# Patient Record
Sex: Female | Born: 1935 | Race: White | Hispanic: No | Marital: Married | Smoking: Former smoker
Health system: Southern US, Community
[De-identification: ages and names within clinical notes are randomized; demographics above are authoritative.]

## PROBLEM LIST (undated history)

## (undated) DIAGNOSIS — K5720 Diverticulitis of large intestine with perforation and abscess without bleeding: Secondary | ICD-10-CM

## (undated) DIAGNOSIS — I10 Essential (primary) hypertension: Secondary | ICD-10-CM

## (undated) DIAGNOSIS — I4821 Permanent atrial fibrillation: Secondary | ICD-10-CM

## (undated) DIAGNOSIS — M8580 Other specified disorders of bone density and structure, unspecified site: Secondary | ICD-10-CM

## (undated) HISTORY — DX: Essential (primary) hypertension: I10

## (undated) HISTORY — PX: REPLACEMENT TOTAL KNEE: SUR1224

## (undated) HISTORY — DX: Diverticulitis of large intestine with perforation and abscess without bleeding: K57.20

## (undated) HISTORY — DX: Other specified disorders of bone density and structure, unspecified site: M85.80

---
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
## 1969-10-18 HISTORY — PX: ABDOMINAL HYSTERECTOMY: SHX81

## 1970-10-18 HISTORY — PX: OVARIAN CYST SURGERY: SHX726

## 1989-10-18 HISTORY — PX: COLON RESECTION: SHX5231

## 1999-03-11 ENCOUNTER — Other Ambulatory Visit: Admission: RE | Admit: 1999-03-11 | Discharge: 1999-03-11 | Payer: Self-pay | Admitting: Gynecology

## 2000-07-06 ENCOUNTER — Other Ambulatory Visit: Admission: RE | Admit: 2000-07-06 | Discharge: 2000-07-06 | Payer: Self-pay | Admitting: Gynecology

## 2001-11-20 ENCOUNTER — Other Ambulatory Visit: Admission: RE | Admit: 2001-11-20 | Discharge: 2001-11-20 | Payer: Self-pay | Admitting: Gynecology

## 2005-12-02 ENCOUNTER — Other Ambulatory Visit: Admission: RE | Admit: 2005-12-02 | Discharge: 2005-12-02 | Payer: Self-pay | Admitting: Gynecology

## 2014-06-11 ENCOUNTER — Ambulatory Visit: Payer: Self-pay | Admitting: Gynecology

## 2014-07-24 ENCOUNTER — Encounter: Payer: Self-pay | Admitting: Gynecology

## 2014-07-24 ENCOUNTER — Ambulatory Visit (INDEPENDENT_AMBULATORY_CARE_PROVIDER_SITE_OTHER): Payer: Medicare Other | Admitting: Gynecology

## 2014-07-24 VITALS — BP 130/80 | Ht 61.0 in | Wt 121.0 lb

## 2014-07-24 DIAGNOSIS — N762 Acute vulvitis: Secondary | ICD-10-CM

## 2014-07-24 DIAGNOSIS — N952 Postmenopausal atrophic vaginitis: Secondary | ICD-10-CM

## 2014-07-24 DIAGNOSIS — N898 Other specified noninflammatory disorders of vagina: Secondary | ICD-10-CM

## 2014-07-24 LAB — WET PREP FOR TRICH, YEAST, CLUE
CLUE CELLS WET PREP: NONE SEEN
TRICH WET PREP: NONE SEEN
YEAST WET PREP: NONE SEEN

## 2014-07-24 MED ORDER — CLOTRIMAZOLE-BETAMETHASONE 1-0.05 % EX CREA: 1.0000 "application " | TOPICAL_CREAM | Freq: Two times a day (BID) | CUTANEOUS | Status: DC

## 2014-07-24 NOTE — Patient Instructions (Signed)
You may obtain a copy of any labs that were done today by logging onto MyChart as outlined in the instructions provided with your AVS (after visit summary). The office will not call with normal lab results but certainly if there are any significant abnormalities then we will contact you.   Health Maintenance, Female A healthy lifestyle and preventative care can promote health and wellness.  Maintain regular health, dental, and eye exams.  Eat a healthy diet. Foods like vegetables, fruits, whole grains, low-fat dairy products, and lean protein foods contain the nutrients you need without too many calories. Decrease your intake of foods high in solid fats, added sugars, and salt. Get information about a proper diet from your caregiver, if necessary.  Regular physical exercise is one of the most important things you can do for your health. Most adults should get at least 150 minutes of moderate-intensity exercise (any activity that increases your heart rate and causes you to sweat) each week. In addition, most adults need muscle-strengthening exercises on 2 or more days a week.   Maintain a healthy weight. The body mass index (BMI) is a screening tool to identify possible weight problems. It provides an estimate of body fat based on height and weight. Your caregiver can help determine your BMI, and can help you achieve or maintain a healthy weight. For adults 20 years and older:  A BMI below 18.5 is considered underweight.  A BMI of 18.5 to 24.9 is normal.  A BMI of 25 to 29.9 is considered overweight.  A BMI of 30 and above is considered obese.  Maintain normal blood lipids and cholesterol by exercising and minimizing your intake of saturated fat. Eat a balanced diet with plenty of fruits and vegetables. Blood tests for lipids and cholesterol should begin at age 61 and be repeated every 5 years. If your lipid or cholesterol levels are high, you are over 50, or you are a high risk for heart  disease, you may need your cholesterol levels checked more frequently.Ongoing high lipid and cholesterol levels should be treated with medicines if diet and exercise are not effective.  If you smoke, find out from your caregiver how to quit. If you do not use tobacco, do not start.  Lung cancer screening is recommended for adults aged 33 80 years who are at high risk for developing lung cancer because of a history of smoking. Yearly low-dose computed tomography (CT) is recommended for people who have at least a 30-pack-year history of smoking and are a current smoker or have quit within the past 15 years. A pack year of smoking is smoking an average of 1 pack of cigarettes a day for 1 year (for example: 1 pack a day for 30 years or 2 packs a day for 15 years). Yearly screening should continue until the smoker has stopped smoking for at least 15 years. Yearly screening should also be stopped for people who develop a health problem that would prevent them from having lung cancer treatment.  If you are pregnant, do not drink alcohol. If you are breastfeeding, be very cautious about drinking alcohol. If you are not pregnant and choose to drink alcohol, do not exceed 1 drink per day. One drink is considered to be 12 ounces (355 mL) of beer, 5 ounces (148 mL) of wine, or 1.5 ounces (44 mL) of liquor.  Avoid use of street drugs. Do not share needles with anyone. Ask for help if you need support or instructions about stopping  the use of drugs.  High blood pressure causes heart disease and increases the risk of stroke. Blood pressure should be checked at least every 1 to 2 years. Ongoing high blood pressure should be treated with medicines, if weight loss and exercise are not effective.  If you are 59 to 78 years old, ask your caregiver if you should take aspirin to prevent strokes.  Diabetes screening involves taking a blood sample to check your fasting blood sugar level. This should be done once every 3  years, after age 91, if you are within normal weight and without risk factors for diabetes. Testing should be considered at a younger age or be carried out more frequently if you are overweight and have at least 1 risk factor for diabetes.  Breast cancer screening is essential preventative care for women. You should practice "breast self-awareness." This means understanding the normal appearance and feel of your breasts and may include breast self-examination. Any changes detected, no matter how small, should be reported to a caregiver. Women in their 66s and 30s should have a clinical breast exam (CBE) by a caregiver as part of a regular health exam every 1 to 3 years. After age 101, women should have a CBE every year. Starting at age 100, women should consider having a mammogram (breast X-ray) every year. Women who have a family history of breast cancer should talk to their caregiver about genetic screening. Women at a high risk of breast cancer should talk to their caregiver about having an MRI and a mammogram every year.  Breast cancer gene (BRCA)-related cancer risk assessment is recommended for women who have family members with BRCA-related cancers. BRCA-related cancers include breast, ovarian, tubal, and peritoneal cancers. Having family members with these cancers may be associated with an increased risk for harmful changes (mutations) in the breast cancer genes BRCA1 and BRCA2. Results of the assessment will determine the need for genetic counseling and BRCA1 and BRCA2 testing.  The Pap test is a screening test for cervical cancer. Women should have a Pap test starting at age 57. Between ages 25 and 35, Pap tests should be repeated every 2 years. Beginning at age 37, you should have a Pap test every 3 years as long as the past 3 Pap tests have been normal. If you had a hysterectomy for a problem that was not cancer or a condition that could lead to cancer, then you no longer need Pap tests. If you are  between ages 50 and 76, and you have had normal Pap tests going back 10 years, you no longer need Pap tests. If you have had past treatment for cervical cancer or a condition that could lead to cancer, you need Pap tests and screening for cancer for at least 20 years after your treatment. If Pap tests have been discontinued, risk factors (such as a new sexual partner) need to be reassessed to determine if screening should be resumed. Some women have medical problems that increase the chance of getting cervical cancer. In these cases, your caregiver may recommend more frequent screening and Pap tests.  The human papillomavirus (HPV) test is an additional test that may be used for cervical cancer screening. The HPV test looks for the virus that can cause the cell changes on the cervix. The cells collected during the Pap test can be tested for HPV. The HPV test could be used to screen women aged 44 years and older, and should be used in women of any age  who have unclear Pap test results. After the age of 55, women should have HPV testing at the same frequency as a Pap test.  Colorectal cancer can be detected and often prevented. Most routine colorectal cancer screening begins at the age of 44 and continues through age 20. However, your caregiver may recommend screening at an earlier age if you have risk factors for colon cancer. On a yearly basis, your caregiver may provide home test kits to check for hidden blood in the stool. Use of a small camera at the end of a tube, to directly examine the colon (sigmoidoscopy or colonoscopy), can detect the earliest forms of colorectal cancer. Talk to your caregiver about this at age 86, when routine screening begins. Direct examination of the colon should be repeated every 5 to 10 years through age 13, unless early forms of pre-cancerous polyps or small growths are found.  Hepatitis C blood testing is recommended for all people born from [birth year] through 1963 and any  individual with known risks for hepatitis C.  Practice safe sex. Use condoms and avoid high-risk sexual practices to reduce the spread of sexually transmitted infections (STIs). Sexually active women aged 36 and younger should be checked for Chlamydia, which is a common sexually transmitted infection. Older women with new or multiple partners should also be tested for Chlamydia. Testing for other STIs is recommended if you are sexually active and at increased risk.  Osteoporosis is a disease in which the bones lose minerals and strength with aging. This can result in serious bone fractures. The risk of osteoporosis can be identified using a bone density scan. Women ages 20 and over and women at risk for fractures or osteoporosis should discuss screening with their caregivers. Ask your caregiver whether you should be taking a calcium supplement or vitamin D to reduce the rate of osteoporosis.  Menopause can be associated with physical symptoms and risks. Hormone replacement therapy is available to decrease symptoms and risks. You should talk to your caregiver about whether hormone replacement therapy is right for you.  Use sunscreen. Apply sunscreen liberally and repeatedly throughout the day. You should seek shade when your shadow is shorter than you. Protect yourself by wearing long sleeves, pants, a wide-brimmed hat, and sunglasses year round, whenever you are outdoors.  Notify your caregiver of new moles or changes in moles, especially if there is a change in shape or color. Also notify your caregiver if a mole is larger than the size of a pencil eraser.  Stay current with your immunizations. Document Released: 04/19/2011 Document Revised: 01/29/2013 Document Reviewed: 04/19/2011 [facility] Patient Information 2014 [employer].

## 2014-07-24 NOTE — Progress Notes (Signed)
Carrie Morales 02/10/1928 [id number]        80 y.o.  G2P0011 new patient, former patient of Dr. Nicholas LoseLomax complaining of one-week history of vulvar irritation. History of same complaint several years ago when she was treated with betamethasone/clotrimazole cream with good relief.    Past medical history,surgical history, problem list, medications, allergies, family history and social history were all reviewed and documented as reviewed in the EPIC chart.  ROS:  12 system ROS performed with pertinent positives and negatives included in the history, assessment and plan.   Additional significant findings :  none   Exam: [facility] personassistant Filed Vitals:   07/24/14 1409  BP: 130/80  Height: 5\' 1"  (1.549 m)  Weight: 121 lb (54.885 kg)   General appearance:  Normal affect, orientation and appearance. Skin: Grossly normal HEENT: Without gross lesions.  No cervical or supraclavicular adenopathy. Thyroid normal.  Lungs:  Clear without wheezing, rales or rhonchi Cardiac: RR, without RMG Abdominal:  Soft, nontender, without masses, guarding, rebound, organomegaly or hernia Breasts:  Examined lying and sitting without masses, retractions, discharge or axillary adenopathy. Pelvic:  Ext/BUS/vagina with significant atrophic changes  Adnexa  Without masses or tenderness    Anus and perineum  Normal   Rectovaginal  Normal sphincter tone without palpated masses or tenderness.    Assessment/Plan:  80 y.o. G2P0011 new patient.   1. Postmenopausal/atrophic genital changes. Patient status post TAH and subsequent LSO for ectopic pregnancy. Right ovarian cystectomy. Without significant hot flushes, night sweats or vaginal dryness. Is not sexual active. Continue to monitor. 2. Vulvitis. One week of vulvar itching. No discharge odor or urinary complaints.  Exam overall was unremarkable. Wet prep is negative. We'll cover for external irritation with Lotrisone cream twice a day as needed as this seems to have  helped in the past. Follow up if symptoms persist, worsen or recur. 3. Pap smear 2011. No Pap smear done today. No history of significant abnormal Pap smears. Reviewed current screening guidelines. We both agreed to stop screening issues over the is a 65 and status post hysterectomy. 4. Mammography 2013. Recommend follow up mammogram now and patient agrees to schedule. SBE monthly reviewed. 5. DEXA reported several years ago. Do not have a copy of this. Patient is going to check with her primary physician to see when her last bone density was and schedule if needed. Increase calcium vitamin D reviewed. 6. Colonoscopy several years ago historically. Will repeat at her primary physician's recommendation. 7. Health maintenance. No blood work done as this is done through her primary physician's office. Follow up one year, sooner as needed.     Dara LordsFONTAINE,Erin Obando P MD, 2:52 PM 07/24/2014     4

## 2014-07-24 NOTE — Addendum Note (Signed)
Addended by: Dayna BarkerGARDNER, KIMBERLY K on: 07/24/2014 03:40 PM   Modules accepted: Orders

## 2014-07-25 LAB — URINALYSIS W MICROSCOPIC + REFLEX CULTURE
Bacteria, UA: NONE SEEN
Bilirubin Urine: NEGATIVE
Casts: NONE SEEN
Crystals: NONE SEEN
Glucose, UA: NEGATIVE mg/dL
Hgb urine dipstick: NEGATIVE
Ketones, ur: NEGATIVE mg/dL
Nitrite: NEGATIVE
PROTEIN: NEGATIVE mg/dL
Specific Gravity, Urine: 1.009 (ref 1.005–1.030)
Squamous Epithelial / LPF: NONE SEEN
UROBILINOGEN UA: 0.2 mg/dL (ref 0.0–1.0)
pH: 6 (ref 5.0–8.0)

## 2014-07-25 LAB — URINE CULTURE
COLONY COUNT: NO GROWTH
ORGANISM ID, BACTERIA: NO GROWTH

## 2014-08-19 ENCOUNTER — Encounter: Payer: Self-pay | Admitting: Gynecology

## 2014-09-25 ENCOUNTER — Encounter: Payer: Self-pay | Admitting: Gynecology

## 2014-10-04 ENCOUNTER — Encounter: Payer: Self-pay | Admitting: Gynecology

## 2014-12-31 ENCOUNTER — Encounter: Payer: Self-pay | Admitting: Gynecology

## 2014-12-31 ENCOUNTER — Ambulatory Visit (INDEPENDENT_AMBULATORY_CARE_PROVIDER_SITE_OTHER): Payer: Medicare Other | Admitting: Gynecology

## 2014-12-31 VITALS — BP 130/84

## 2014-12-31 DIAGNOSIS — N898 Other specified noninflammatory disorders of vagina: Secondary | ICD-10-CM

## 2014-12-31 LAB — WET PREP FOR TRICH, YEAST, CLUE
Clue Cells Wet Prep HPF POC: NONE SEEN
TRICH WET PREP: NONE SEEN
YEAST WET PREP: NONE SEEN

## 2014-12-31 MED ORDER — FLUCONAZOLE 150 MG PO TABS: 150.0000 mg | ORAL_TABLET | Freq: Once | ORAL | Status: DC

## 2014-12-31 NOTE — Patient Instructions (Signed)
Take the one Diflucan pill. Call me if the vaginal discharge continues.

## 2014-12-31 NOTE — Progress Notes (Signed)
Carrie Morales 03/13/1928 [id number]        81 y.o.  G2P0011 Presents with several weeks of vaginal discharge. Initially had full for irritation with this but used Lotrisone cream externally and this has resolved. Her discharge has continued. No urinary symptoms such as frequency dysuria or urgency. No vaginal odor.  Past medical history,surgical history, problem list, medications, allergies, family history and social history were all reviewed and documented in the EPIC chart.  Directed ROS with pertinent positives and negatives documented in the history of present illness/assessment and plan.  Exam: Carrie Morales assistant Filed Vitals:   12/31/14 1514  BP: 130/84   General appearance:  Normal Abdomen soft nontender without masses guarding rebound Pelvic external BUS vagina normal with atrophic changes.  Slight white discharge noted. Bimanual without masses or tenderness  Assessment/Plan:  81 y.o. G2P0011 with history suspicious for low-grade yeast. Her wet prep is negative. Will cover with Diflucan 150 mg 1 dose. If her discharge continues and patient will call me and I will cover a low-grade bacterial with Cleocin vaginal cream but at this point we'll hold to see if she doesn't clear with a single Diflucan.     Dara LordsFONTAINE,Earlee Herald P MD, 4:14 PM 12/31/2014

## 2015-01-06 ENCOUNTER — Telehealth: Payer: Self-pay | Admitting: *Deleted

## 2015-01-06 MED ORDER — FLUCONAZOLE 150 MG PO TABS: 150.0000 mg | ORAL_TABLET | Freq: Once | ORAL | Status: DC

## 2015-01-06 NOTE — Telephone Encounter (Signed)
Okay to refill Diflucan 1

## 2015-01-06 NOTE — Telephone Encounter (Signed)
Pt was seen and treated for yeast infection on 12/31/14 with Diflucan 150 mg x 1 dose. Pt said she feel much better, asked if 1 more pill could be sent to pharmacy to make it 100% better. Still has some irritation. Please advise

## 2015-01-06 NOTE — Telephone Encounter (Signed)
Pt informed, Rx sent. 

## 2019-03-25 ENCOUNTER — Inpatient Hospital Stay (HOSPITAL_COMMUNITY): Payer: Medicare Other

## 2019-03-25 ENCOUNTER — Other Ambulatory Visit: Payer: Self-pay

## 2019-03-25 ENCOUNTER — Inpatient Hospital Stay (HOSPITAL_COMMUNITY)
Admission: EM | Admit: 2019-03-25 | Discharge: 2019-04-18 | DRG: 870 | Disposition: E | Payer: Medicare Other | Source: Ambulatory Visit | Attending: Internal Medicine | Admitting: Internal Medicine

## 2019-03-25 ENCOUNTER — Emergency Department (HOSPITAL_COMMUNITY): Payer: Medicare Other

## 2019-03-25 ENCOUNTER — Encounter (HOSPITAL_COMMUNITY): Payer: Self-pay | Admitting: *Deleted

## 2019-03-25 DIAGNOSIS — Z7901 Long term (current) use of anticoagulants: Secondary | ICD-10-CM

## 2019-03-25 DIAGNOSIS — J690 Pneumonitis due to inhalation of food and vomit: Secondary | ICD-10-CM | POA: Diagnosis present

## 2019-03-25 DIAGNOSIS — Z452 Encounter for adjustment and management of vascular access device: Secondary | ICD-10-CM

## 2019-03-25 DIAGNOSIS — N183 Chronic kidney disease, stage 3 (moderate): Secondary | ICD-10-CM | POA: Diagnosis present

## 2019-03-25 DIAGNOSIS — R7989 Other specified abnormal findings of blood chemistry: Secondary | ICD-10-CM | POA: Diagnosis not present

## 2019-03-25 DIAGNOSIS — I313 Pericardial effusion (noninflammatory): Secondary | ICD-10-CM | POA: Diagnosis present

## 2019-03-25 DIAGNOSIS — E46 Unspecified protein-calorie malnutrition: Secondary | ICD-10-CM | POA: Diagnosis present

## 2019-03-25 DIAGNOSIS — Z87891 Personal history of nicotine dependence: Secondary | ICD-10-CM

## 2019-03-25 DIAGNOSIS — J80 Acute respiratory distress syndrome: Secondary | ICD-10-CM | POA: Diagnosis present

## 2019-03-25 DIAGNOSIS — Z882 Allergy status to sulfonamides status: Secondary | ICD-10-CM

## 2019-03-25 DIAGNOSIS — J181 Lobar pneumonia, unspecified organism: Secondary | ICD-10-CM

## 2019-03-25 DIAGNOSIS — Y92239 Unspecified place in hospital as the place of occurrence of the external cause: Secondary | ICD-10-CM | POA: Diagnosis not present

## 2019-03-25 DIAGNOSIS — M8580 Other specified disorders of bone density and structure, unspecified site: Secondary | ICD-10-CM | POA: Diagnosis present

## 2019-03-25 DIAGNOSIS — I5032 Chronic diastolic (congestive) heart failure: Secondary | ICD-10-CM | POA: Diagnosis not present

## 2019-03-25 DIAGNOSIS — I130 Hypertensive heart and chronic kidney disease with heart failure and stage 1 through stage 4 chronic kidney disease, or unspecified chronic kidney disease: Secondary | ICD-10-CM | POA: Diagnosis present

## 2019-03-25 DIAGNOSIS — Z9911 Dependence on respirator [ventilator] status: Secondary | ICD-10-CM

## 2019-03-25 DIAGNOSIS — J9601 Acute respiratory failure with hypoxia: Secondary | ICD-10-CM

## 2019-03-25 DIAGNOSIS — Z880 Allergy status to penicillin: Secondary | ICD-10-CM | POA: Diagnosis not present

## 2019-03-25 DIAGNOSIS — A419 Sepsis, unspecified organism: Secondary | ICD-10-CM | POA: Diagnosis present

## 2019-03-25 DIAGNOSIS — Z809 Family history of malignant neoplasm, unspecified: Secondary | ICD-10-CM

## 2019-03-25 DIAGNOSIS — Z978 Presence of other specified devices: Secondary | ICD-10-CM | POA: Diagnosis not present

## 2019-03-25 DIAGNOSIS — I4821 Permanent atrial fibrillation: Secondary | ICD-10-CM | POA: Diagnosis present

## 2019-03-25 DIAGNOSIS — K5651 Intestinal adhesions [bands], with partial obstruction: Secondary | ICD-10-CM | POA: Diagnosis not present

## 2019-03-25 DIAGNOSIS — Z7982 Long term (current) use of aspirin: Secondary | ICD-10-CM

## 2019-03-25 DIAGNOSIS — K56600 Partial intestinal obstruction, unspecified as to cause: Secondary | ICD-10-CM | POA: Diagnosis present

## 2019-03-25 DIAGNOSIS — J189 Pneumonia, unspecified organism: Secondary | ICD-10-CM | POA: Diagnosis present

## 2019-03-25 DIAGNOSIS — N179 Acute kidney failure, unspecified: Secondary | ICD-10-CM | POA: Diagnosis not present

## 2019-03-25 DIAGNOSIS — T380X5A Adverse effect of glucocorticoids and synthetic analogues, initial encounter: Secondary | ICD-10-CM | POA: Diagnosis not present

## 2019-03-25 DIAGNOSIS — R197 Diarrhea, unspecified: Secondary | ICD-10-CM | POA: Diagnosis not present

## 2019-03-25 DIAGNOSIS — Z0189 Encounter for other specified special examinations: Secondary | ICD-10-CM

## 2019-03-25 DIAGNOSIS — I5033 Acute on chronic diastolic (congestive) heart failure: Secondary | ICD-10-CM | POA: Diagnosis present

## 2019-03-25 DIAGNOSIS — I4891 Unspecified atrial fibrillation: Secondary | ICD-10-CM

## 2019-03-25 DIAGNOSIS — Z20828 Contact with and (suspected) exposure to other viral communicable diseases: Secondary | ICD-10-CM | POA: Diagnosis present

## 2019-03-25 DIAGNOSIS — Z7189 Other specified counseling: Secondary | ICD-10-CM | POA: Diagnosis not present

## 2019-03-25 DIAGNOSIS — I5031 Acute diastolic (congestive) heart failure: Secondary | ICD-10-CM | POA: Diagnosis not present

## 2019-03-25 DIAGNOSIS — I361 Nonrheumatic tricuspid (valve) insufficiency: Secondary | ICD-10-CM | POA: Diagnosis not present

## 2019-03-25 DIAGNOSIS — I482 Chronic atrial fibrillation, unspecified: Secondary | ICD-10-CM | POA: Diagnosis present

## 2019-03-25 DIAGNOSIS — Z885 Allergy status to narcotic agent status: Secondary | ICD-10-CM

## 2019-03-25 DIAGNOSIS — J9690 Respiratory failure, unspecified, unspecified whether with hypoxia or hypercapnia: Secondary | ICD-10-CM

## 2019-03-25 DIAGNOSIS — I700 Atherosclerosis of aorta: Secondary | ICD-10-CM | POA: Diagnosis present

## 2019-03-25 DIAGNOSIS — K449 Diaphragmatic hernia without obstruction or gangrene: Secondary | ICD-10-CM | POA: Diagnosis present

## 2019-03-25 DIAGNOSIS — K56609 Unspecified intestinal obstruction, unspecified as to partial versus complete obstruction: Secondary | ICD-10-CM

## 2019-03-25 DIAGNOSIS — Z79899 Other long term (current) drug therapy: Secondary | ICD-10-CM

## 2019-03-25 DIAGNOSIS — R739 Hyperglycemia, unspecified: Secondary | ICD-10-CM | POA: Diagnosis present

## 2019-03-25 DIAGNOSIS — Z66 Do not resuscitate: Secondary | ICD-10-CM | POA: Diagnosis not present

## 2019-03-25 DIAGNOSIS — Z515 Encounter for palliative care: Secondary | ICD-10-CM | POA: Diagnosis not present

## 2019-03-25 DIAGNOSIS — I2510 Atherosclerotic heart disease of native coronary artery without angina pectoris: Secondary | ICD-10-CM | POA: Diagnosis not present

## 2019-03-25 DIAGNOSIS — I080 Rheumatic disorders of both mitral and aortic valves: Secondary | ICD-10-CM | POA: Diagnosis present

## 2019-03-25 DIAGNOSIS — I10 Essential (primary) hypertension: Secondary | ICD-10-CM | POA: Diagnosis not present

## 2019-03-25 DIAGNOSIS — E870 Hyperosmolality and hypernatremia: Secondary | ICD-10-CM | POA: Diagnosis not present

## 2019-03-25 DIAGNOSIS — I2583 Coronary atherosclerosis due to lipid rich plaque: Secondary | ICD-10-CM | POA: Diagnosis present

## 2019-03-25 DIAGNOSIS — I4892 Unspecified atrial flutter: Secondary | ICD-10-CM | POA: Diagnosis present

## 2019-03-25 DIAGNOSIS — Z6820 Body mass index (BMI) 20.0-20.9, adult: Secondary | ICD-10-CM

## 2019-03-25 DIAGNOSIS — J9621 Acute and chronic respiratory failure with hypoxia: Secondary | ICD-10-CM | POA: Diagnosis not present

## 2019-03-25 DIAGNOSIS — Z90710 Acquired absence of both cervix and uterus: Secondary | ICD-10-CM | POA: Diagnosis not present

## 2019-03-25 DIAGNOSIS — I959 Hypotension, unspecified: Secondary | ICD-10-CM

## 2019-03-25 DIAGNOSIS — R0902 Hypoxemia: Secondary | ICD-10-CM

## 2019-03-25 DIAGNOSIS — E860 Dehydration: Secondary | ICD-10-CM | POA: Diagnosis present

## 2019-03-25 DIAGNOSIS — R0602 Shortness of breath: Secondary | ICD-10-CM

## 2019-03-25 DIAGNOSIS — I340 Nonrheumatic mitral (valve) insufficiency: Secondary | ICD-10-CM | POA: Diagnosis not present

## 2019-03-25 DIAGNOSIS — R778 Other specified abnormalities of plasma proteins: Secondary | ICD-10-CM

## 2019-03-25 HISTORY — DX: Permanent atrial fibrillation: I48.21

## 2019-03-25 HISTORY — DX: Essential (primary) hypertension: I10

## 2019-03-25 LAB — BLOOD GAS, ARTERIAL
Acid-base deficit: 1.1 mmol/L (ref 0.0–2.0)
Bicarbonate: 23.7 mmol/L (ref 20.0–28.0)
FIO2: 100
O2 Saturation: 81.8 %
Patient temperature: 37
pCO2 arterial: 32.4 mmHg (ref 32.0–48.0)
pH, Arterial: 7.452 — ABNORMAL HIGH (ref 7.350–7.450)
pO2, Arterial: 47.2 mmHg — ABNORMAL LOW (ref 83.0–108.0)

## 2019-03-25 LAB — CBC WITH DIFFERENTIAL/PLATELET
Abs Immature Granulocytes: 0.05 10*3/uL (ref 0.00–0.07)
Basophils Absolute: 0 10*3/uL (ref 0.0–0.1)
Basophils Relative: 0 %
Eosinophils Absolute: 0 10*3/uL (ref 0.0–0.5)
Eosinophils Relative: 0 %
HCT: 53.1 % — ABNORMAL HIGH (ref 36.0–46.0)
Hemoglobin: 16.6 g/dL — ABNORMAL HIGH (ref 12.0–15.0)
Immature Granulocytes: 0 %
Lymphocytes Relative: 6 %
Lymphs Abs: 0.8 10*3/uL (ref 0.7–4.0)
MCH: 30.1 pg (ref 26.0–34.0)
MCHC: 31.3 g/dL (ref 30.0–36.0)
MCV: 96.2 fL (ref 80.0–100.0)
Monocytes Absolute: 0.5 10*3/uL (ref 0.1–1.0)
Monocytes Relative: 4 %
Neutro Abs: 11.2 10*3/uL — ABNORMAL HIGH (ref 1.7–7.7)
Neutrophils Relative %: 90 %
Platelets: 304 10*3/uL (ref 150–400)
RBC: 5.52 MIL/uL — ABNORMAL HIGH (ref 3.87–5.11)
RDW: 13.4 % (ref 11.5–15.5)
WBC: 12.6 10*3/uL — ABNORMAL HIGH (ref 4.0–10.5)
nRBC: 0 % (ref 0.0–0.2)

## 2019-03-25 LAB — COMPREHENSIVE METABOLIC PANEL
ALT: 25 U/L (ref 0–44)
AST: 25 U/L (ref 15–41)
Albumin: 4 g/dL (ref 3.5–5.0)
Alkaline Phosphatase: 67 U/L (ref 38–126)
Anion gap: 16 — ABNORMAL HIGH (ref 5–15)
BUN: 20 mg/dL (ref 8–23)
CO2: 27 mmol/L (ref 22–32)
Calcium: 10.3 mg/dL (ref 8.9–10.3)
Chloride: 95 mmol/L — ABNORMAL LOW (ref 98–111)
Creatinine, Ser: 0.84 mg/dL (ref 0.44–1.00)
GFR calc Af Amer: >60 mL/min (ref >60)
GFR calc non Af Amer: >60 mL/min (ref >60)
Glucose, Bld: 163 mg/dL — ABNORMAL HIGH (ref 70–99)
Potassium: 4.5 mmol/L (ref 3.5–5.1)
Sodium: 138 mmol/L (ref 135–145)
Total Bilirubin: 1.5 mg/dL — ABNORMAL HIGH (ref 0.3–1.2)
Total Protein: 7.5 g/dL (ref 6.5–8.1)

## 2019-03-25 LAB — URINALYSIS, ROUTINE W REFLEX MICROSCOPIC
Bacteria, UA: NONE SEEN
Bilirubin Urine: NEGATIVE
Glucose, UA: NEGATIVE mg/dL
Hgb urine dipstick: NEGATIVE
Ketones, ur: 20 mg/dL — AB
Leukocytes,Ua: NEGATIVE
Nitrite: NEGATIVE
Protein, ur: 100 mg/dL — AB
Specific Gravity, Urine: 1.027 (ref 1.005–1.030)
pH: 5 (ref 5.0–8.0)

## 2019-03-25 LAB — GLUCOSE, CAPILLARY: Glucose-Capillary: 144 mg/dL — ABNORMAL HIGH (ref 70–99)

## 2019-03-25 LAB — LIPASE, BLOOD: Lipase: 46 U/L (ref 11–51)

## 2019-03-25 LAB — TROPONIN I: Troponin I: 0.03 ng/mL (ref <0.03)

## 2019-03-25 LAB — SARS CORONAVIRUS 2 BY RT PCR (HOSPITAL ORDER, PERFORMED IN ~~LOC~~ HOSPITAL LAB): SARS Coronavirus 2: NEGATIVE

## 2019-03-25 LAB — LACTIC ACID, PLASMA: Lactic Acid, Venous: 4.4 mmol/L (ref 0.5–1.9)

## 2019-03-25 MED ORDER — DILTIAZEM HCL 25 MG/5ML IV SOLN
10.0000 mg | Freq: Once | INTRAVENOUS | Status: AC
  Filled 2019-03-25: qty 5

## 2019-03-25 MED ORDER — PANTOPRAZOLE SODIUM 40 MG IV SOLR
40.0000 mg | Freq: Once | INTRAVENOUS | Status: AC
  Administered 2019-03-25: 40 mg via INTRAVENOUS
  Filled 2019-03-25: qty 40

## 2019-03-25 MED ORDER — SODIUM CHLORIDE 0.9% FLUSH
3.0000 mL | INTRAVENOUS | Status: DC | PRN
  Administered 2019-03-25: 3 mL via INTRAVENOUS
  Filled 2019-03-25: qty 3

## 2019-03-25 MED ORDER — CHLORHEXIDINE GLUCONATE CLOTH 2 % EX PADS
6.0000 | MEDICATED_PAD | Freq: Every day | CUTANEOUS | Status: DC
  Administered 2019-03-25 – 2019-03-26 (×2): 6 via TOPICAL

## 2019-03-25 MED ORDER — LABETALOL HCL 5 MG/ML IV SOLN: 10.0000 mg | INTRAVENOUS | Status: DC | PRN

## 2019-03-25 MED ORDER — ACETAMINOPHEN 650 MG RE SUPP
650.0000 mg | Freq: Four times a day (QID) | RECTAL | Status: DC | PRN
  Administered 2019-03-25 – 2019-03-26 (×2): 650 mg via RECTAL
  Filled 2019-03-25 (×2): qty 1

## 2019-03-25 MED ORDER — SODIUM CHLORIDE 0.9 % IV BOLUS
500.0000 mL | Freq: Once | INTRAVENOUS | Status: AC
  Administered 2019-03-25: 500 mL via INTRAVENOUS

## 2019-03-25 MED ORDER — KCL IN DEXTROSE-NACL 20-5-0.45 MEQ/L-%-% IV SOLN
INTRAVENOUS | Status: DC
  Administered 2019-03-25 – 2019-03-26 (×2): via INTRAVENOUS

## 2019-03-25 MED ORDER — KCL IN DEXTROSE-NACL 10-5-0.45 MEQ/L-%-% IV SOLN
INTRAVENOUS | Status: DC
  Filled 2019-03-25: qty 1000

## 2019-03-25 MED ORDER — DIGOXIN 0.25 MG/ML IJ SOLN
0.1250 mg | Freq: Every day | INTRAMUSCULAR | Status: DC
  Administered 2019-03-25: 17:00:00 0.125 mg via INTRAVENOUS
  Filled 2019-03-25: qty 2

## 2019-03-25 MED ORDER — METOPROLOL TARTRATE 5 MG/5ML IV SOLN
5.0000 mg | Freq: Four times a day (QID) | INTRAVENOUS | Status: DC
  Filled 2019-03-25: qty 5

## 2019-03-25 MED ORDER — IOHEXOL 300 MG/ML  SOLN
100.0000 mL | Freq: Once | INTRAMUSCULAR | Status: AC | PRN
  Administered 2019-03-25: 13:00:00 100 mL via INTRAVENOUS

## 2019-03-25 MED ORDER — POLYETHYLENE GLYCOL 3350 17 G PO PACK: 17.0000 g | PACK | Freq: Every day | ORAL | Status: DC | PRN

## 2019-03-25 MED ORDER — SODIUM CHLORIDE 0.9 % IV SOLN
2.0000 g | INTRAVENOUS | Status: DC
  Administered 2019-03-25: 23:00:00 2 g via INTRAVENOUS
  Filled 2019-03-25: qty 20

## 2019-03-25 MED ORDER — METOPROLOL TARTRATE 5 MG/5ML IV SOLN
INTRAVENOUS | Status: AC
  Administered 2019-03-25: 5 mg
  Filled 2019-03-25: qty 5

## 2019-03-25 MED ORDER — LIDOCAINE HCL URETHRAL/MUCOSAL 2 % EX GEL
1.0000 "application " | Freq: Once | CUTANEOUS | Status: AC
  Administered 2019-03-25: 1
  Filled 2019-03-25: qty 10

## 2019-03-25 MED ORDER — PANTOPRAZOLE SODIUM 40 MG IV SOLR
40.0000 mg | INTRAVENOUS | Status: DC
  Administered 2019-03-26 – 2019-04-10 (×16): 40 mg via INTRAVENOUS
  Filled 2019-03-25 (×17): qty 40

## 2019-03-25 MED ORDER — TRAZODONE HCL 50 MG PO TABS
50.0000 mg | ORAL_TABLET | Freq: Every evening | ORAL | Status: DC | PRN
  Administered 2019-03-26 – 2019-04-08 (×7): 50 mg via ORAL
  Filled 2019-03-25 (×7): qty 1

## 2019-03-25 MED ORDER — METRONIDAZOLE IN NACL 5-0.79 MG/ML-% IV SOLN
500.0000 mg | Freq: Three times a day (TID) | INTRAVENOUS | Status: DC
  Administered 2019-03-26 (×2): 500 mg via INTRAVENOUS
  Filled 2019-03-25 (×2): qty 100

## 2019-03-25 MED ORDER — HALOPERIDOL LACTATE 5 MG/ML IJ SOLN
2.0000 mg | Freq: Once | INTRAMUSCULAR | Status: AC
  Administered 2019-03-25: 2 mg via INTRAVENOUS
  Filled 2019-03-25: qty 1

## 2019-03-25 MED ORDER — ONDANSETRON HCL 4 MG/2ML IJ SOLN: 4.0000 mg | Freq: Four times a day (QID) | INTRAMUSCULAR | Status: DC | PRN

## 2019-03-25 MED ORDER — DILTIAZEM HCL 100 MG IV SOLR
5.0000 mg/h | INTRAVENOUS | Status: DC
  Administered 2019-03-26: 07:00:00 5 mg/h via INTRAVENOUS
  Administered 2019-03-26 – 2019-03-27 (×4): 10 mg/h via INTRAVENOUS
  Administered 2019-03-28 – 2019-03-30 (×8): 12.5 mg/h via INTRAVENOUS
  Filled 2019-03-25 (×14): qty 100

## 2019-03-25 MED ORDER — ONDANSETRON HCL 4 MG PO TABS: 4.0000 mg | ORAL_TABLET | Freq: Four times a day (QID) | ORAL | Status: DC | PRN

## 2019-03-25 MED ORDER — PROMETHAZINE HCL 25 MG/ML IJ SOLN
12.5000 mg | Freq: Once | INTRAMUSCULAR | Status: AC
  Administered 2019-03-25: 12.5 mg via INTRAVENOUS
  Filled 2019-03-25: qty 1

## 2019-03-25 MED ORDER — ACETAMINOPHEN 325 MG PO TABS
650.0000 mg | ORAL_TABLET | Freq: Four times a day (QID) | ORAL | Status: DC | PRN
  Administered 2019-03-30 – 2019-04-08 (×3): 650 mg via ORAL
  Filled 2019-03-25 (×3): qty 2

## 2019-03-25 MED ORDER — DILTIAZEM HCL 25 MG/5ML IV SOLN
INTRAVENOUS | Status: AC
  Administered 2019-03-25: 10 mg
  Filled 2019-03-25: qty 5

## 2019-03-25 MED ORDER — SODIUM CHLORIDE 0.9% FLUSH
3.0000 mL | Freq: Two times a day (BID) | INTRAVENOUS | Status: DC
  Administered 2019-03-25 – 2019-03-30 (×8): 3 mL via INTRAVENOUS
  Administered 2019-03-31: 10 mL via INTRAVENOUS
  Administered 2019-03-31 – 2019-04-09 (×10): 3 mL via INTRAVENOUS

## 2019-03-25 MED ORDER — ALBUTEROL SULFATE (2.5 MG/3ML) 0.083% IN NEBU
2.5000 mg | INHALATION_SOLUTION | RESPIRATORY_TRACT | Status: DC | PRN
  Administered 2019-03-30: 02:00:00 2.5 mg via RESPIRATORY_TRACT
  Filled 2019-03-25: qty 3

## 2019-03-25 MED ORDER — SODIUM CHLORIDE 0.9 % IV SOLN
250.0000 mL | INTRAVENOUS | Status: DC | PRN
  Administered 2019-03-26 – 2019-04-05 (×2): 250 mL via INTRAVENOUS

## 2019-03-25 MED ORDER — ENOXAPARIN SODIUM 60 MG/0.6ML ~~LOC~~ SOLN
50.0000 mg | Freq: Two times a day (BID) | SUBCUTANEOUS | Status: DC
  Administered 2019-03-25: 50 mg via SUBCUTANEOUS
  Filled 2019-03-25 (×2): qty 0.6

## 2019-03-25 MED ORDER — LORAZEPAM 2 MG/ML IJ SOLN
1.0000 mg | Freq: Once | INTRAMUSCULAR | Status: AC
  Administered 2019-03-25: 15:00:00 1 mg via INTRAVENOUS
  Filled 2019-03-25: qty 1

## 2019-03-25 MED ORDER — DILTIAZEM HCL 100 MG IV SOLR
INTRAVENOUS | Status: AC
  Administered 2019-03-25: 5 mg/h
  Filled 2019-03-25: qty 100

## 2019-03-25 NOTE — ED Triage Notes (Signed)
Constipation since Thursday. MD office sent her over to hospital by EMS to evaluate pt. Pt have been vomiting. Denies SOB

## 2019-03-25 NOTE — Progress Notes (Signed)
CRITICAL VALUE ALERT  Critical Value:  Lactic Acid 4.4 Date & Time Notied:  04/12/2019 2355  Provider Notified: Kennon Holter  Orders Received/Actions taken: NO orders at this time

## 2019-03-25 NOTE — Progress Notes (Signed)
Placed patient on BiPAP at beginning of shift for low arterial blood gas PO2 47.2 on 100  NRB mask , respirations 40-50 hr 150's  Came in for bowel obstruction. Patient since this time has slowed respirations down to 30's saturation 100 on 60% BiPAP 14/6  Hr decreased to 108 on Cardizem drip. Lungs sounds appear clear. Patient is getting out dk brown out of ng tube. Not sure if she aspirated on placement of NG tube in er or before as she has pleural effusions in lung base on CT. She appears to be improving as of note.

## 2019-03-25 NOTE — ED Notes (Signed)
Nurse updated pt's son.

## 2019-03-25 NOTE — ED Notes (Signed)
Nurse updated pt's son, Tressie Ellis, on condition of pt and pt's new room number.

## 2019-03-25 NOTE — ED Provider Notes (Signed)
[facility] EMERGENCY DEPARTMENT Provider Note   CSN: [id number] Arrival date & time: 03/19/2019  [id number]    History   Chief Complaint Chief Complaint  Patient presents with  . Abdominal Pain    HPI Shelda AltesKatherine Wallenstein is a 85 y.o. female presenting for evaluation of N/V and constipation.   Patient states last week she developed some abdominal pain, nausea, vomiting, constipation.  Symptoms improved for 2 days with milk of magnesia.  4 days ago, however, she started to develop constipation.  She has not had a bowel movement since 4 days ago.  She normally has a daily bowel movement.  Patient states yesterday she started developed nausea, has been vomiting since.  She has been unable to tolerate any p.o. since.  She reports abdominal pain has not returned.  She denies fevers, chills, chest pain, shortness of breath, cough, urinary symptoms.  She denies hematemesis.  She has had for midline incisions, including for ectopic pregnancy, hysterectomy, and abscessed diverticula s/p colon resection. She states she has had her GB and appendix removed. She has a recent history of A. fib, started 6 months ago.  She has been on Eliquis since.  Patient reports her PCP has been attempting rate control with different medications, but she has not been on a stable medications.  She has not been able to take her blood pressure, A. fib, or Eliquis medications today due to nausea and vomiting.  She has not had anything to eat or drink today.     HPI  Past Medical History:  Diagnosis Date  . A-fib (HCC)   . Colonic diverticular abscess   . Hypertension   . Osteopenia 09/2014 tab T score -1.1    Patient Active Problem List   Diagnosis Date Noted  . SBO (small bowel obstruction) (HCC) May 30, 2019    Past Surgical History:  Procedure Laterality Date  . ABDOMINAL HYSTERECTOMY  1971  . APPENDECTOMY  1958  . COLON RESECTION  1991  . ECTOPIC PREGNANCY SURGERY  1958   LSO  . OVARIAN CYST SURGERY  1972   right  . REPLACEMENT TOTAL KNEE     X 2     OB History    Gravida  2   Para  1   Term      Preterm      AB  1   Living  1     SAB      TAB      Ectopic  1   Multiple      Live Births               Home Medications    Prior to Admission medications   Medication Sig Start Date End Date Taking? Authorizing Provider  apixaban (ELIQUIS) 2.5 MG TABS tablet Take 2.5 mg by mouth 2 (two) times daily.    Yes [provider]  digoxin (LANOXIN) 0.125 MG tablet Take 0.125 mg by mouth daily.   Yes [provider]  furosemide (LASIX) 20 MG tablet Take 20 mg by mouth See admin instructions. Take one tablet by mouth 5 days per week   Yes [provider]  losartan (COZAAR) 50 MG tablet Take 50 mg by mouth 2 (two) times a day.   Yes [provider]  metoprolol tartrate (LOPRESSOR) 100 MG tablet Take 100 mg by mouth 2 (two) times daily.   Yes [provider]  ondansetron (ZOFRAN) 4 MG tablet Take 4 mg by mouth daily as needed for  nausea or vomiting.   Yes [provider]  potassium chloride (K-DUR) 10 MEQ tablet Take 10 mEq by mouth See admin instructions. Take one tablet by mouth 5 days per week   Yes [provider]  aspirin 81 MG tablet Take 81 mg by mouth daily.    [provider]  Calcium Carbonate-Vitamin D (CALCIUM + D PO) Take 1 tablet by mouth daily.     [provider]  Cholecalciferol (VITAMIN D PO) Take 1 capsule by mouth daily.     [provider]  Multiple Vitamin (MULTIVITAMIN) tablet Take 1 tablet by mouth daily.    [provider]    Family History Family History  Problem Relation Age of Onset  . Cancer Brother        Lung  . Cancer Brother        Bladder    Social History Social History   Tobacco Use  . Smoking status: Former Games developermoker  . Smokeless tobacco: Never Used  Substance Use Topics  . Alcohol use: Not Currently    Comment: Occas  . Drug use: Not  on file     Allergies   Codeine; Penicillins; and Sulfa antibiotics   Review of Systems Review of Systems  Gastrointestinal: Positive for constipation, nausea and vomiting.  All other systems reviewed and are negative.    Physical Exam Updated Vital Signs BP (!) 146/100   Pulse (!) 118   Temp 97.7 F (36.5 C) (Oral)   Resp (!) 22   Ht 5\' 4"  (1.626 m)   Wt 52.2 kg   SpO2 94%   BMI 19.74 kg/m   Physical Exam Vitals signs and nursing note reviewed.  Constitutional:      General: She is not in acute distress.    Appearance: She is well-developed.     Comments: Elderly female who appears nontoxic  HENT:     Head: Normocephalic and atraumatic.  Eyes:     Conjunctiva/sclera: Conjunctivae normal.     Pupils: Pupils are equal, round, and reactive to light.  Neck:     Musculoskeletal: Normal range of motion and neck supple.  Cardiovascular:     Rate and Rhythm: Tachycardia present. Rhythm irregular.     Comments: Irregularly irregular btwn 120 and 140 Pulmonary:     Effort: Pulmonary effort is normal. No respiratory distress.     Breath sounds: Normal breath sounds. No wheezing.     Comments: Clear lung sounds Abdominal:     General: There is distension.     Palpations: Abdomen is soft. There is no mass.     Tenderness: There is no abdominal tenderness. There is no guarding or rebound.     Comments: No ttp of the abd. Slight distention. No rigidity or guarding. Negative rebound.   Musculoskeletal: Normal range of motion.  Skin:    General: Skin is warm and dry.     Capillary Refill: Capillary refill takes less than 2 seconds.  Neurological:     Mental Status: She is alert and oriented to person, place, and time.      ED Treatments / Results  Labs (all labs ordered are listed, but only abnormal results are displayed) Labs Reviewed  CBC WITH DIFFERENTIAL/PLATELET - Abnormal; Notable for the following components:      Result Value   WBC 12.6 (*)    RBC 5.52  (*)    Hemoglobin 16.6 (*)    HCT 53.1 (*)    Neutro Abs 11.2 (*)  All other components within normal limits  COMPREHENSIVE METABOLIC PANEL - Abnormal; Notable for the following components:   Chloride 95 (*)    Glucose, Bld 163 (*)    Total Bilirubin 1.5 (*)    Anion gap 16 (*)    All other components within normal limits  TROPONIN I - Abnormal; Notable for the following components:   Troponin I 0.03 (*)    All other components within normal limits  URINALYSIS, ROUTINE W REFLEX MICROSCOPIC - Abnormal; Notable for the following components:   Color, Urine AMBER (*)    APPearance HAZY (*)    Ketones, ur 20 (*)    Protein, ur 100 (*)    All other components within normal limits  SARS CORONAVIRUS 2 (HOSPITAL ORDER, [place] [facility])  LIPASE, BLOOD    EKG EKG Interpretation  Date/Time:  04-22-2019 10:39:09 EDT Ventricular Rate:  133 PR Interval:    QRS Duration: 84 QT Interval:  271 QTC Calculation: 386 R Axis:   -22 Text Interpretation:  Atrial fibrillation Borderline left axis deviation Probable anteroseptal infarct, recent Lateral leads are also involved Confirmed by Nat Christen [phone]) on 04/22/19 2:36:16 PM   Radiology Ct Abdomen Pelvis W Contrast  Result Date: 04-22-19 CLINICAL DATA:  Constipation since Thursday. Vomiting. Hysterectomy. Appendectomy. High-grade bowel obstruction. EXAM: CT ABDOMEN AND PELVIS WITH CONTRAST TECHNIQUE: Multidetector CT imaging of the abdomen and pelvis was performed using the standard protocol following bolus administration of intravenous contrast. CONTRAST:  162mL OMNIPAQUE IOHEXOL 300 MG/ML  SOLN COMPARISON:  None. FINDINGS: Lower chest: Right base atelectasis. Left base collapse/consolidative change. Moderate cardiomegaly with small pericardial effusion. Small bilateral pleural effusions. A large hiatal hernia, with approximately 1/2 of the stomach positioned in the lower chest. Hepatobiliary: Anterior left  hepatic lobe cyst. Cholecystectomy, without biliary ductal dilatation. Pancreas: Normal, without mass or ductal dilatation. Spleen: Normal in size, without focal abnormality. Adrenals/Urinary Tract: Normal adrenal glands. Bilateral renal low-density lesions which are likely cysts and minimally complex cysts. Other renal lesions are too small to characterize. No hydronephrosis. Normal urinary bladder. Stomach/Bowel: The stomach is distended and fluid-filled. The left side of the colon is relatively decompressed with scattered diverticula. Proximal and mid small bowel loops are dilated including at up to 2.9 cm. This continues to the level of a transition point within the central pelvis, likely in the proximal ileum on image 55/3 and coronal image 41. No obstructive mass identified. No complicating ischemia. Vascular/Lymphatic: Aortic and branch vessel atherosclerosis. No abdominopelvic adenopathy. Reproductive: Hysterectomy.  No adnexal mass. Other: Mild pelvic floor laxity.  No free intraperitoneal air. Musculoskeletal: Lumbosacral spondylosis with moderate convex left lumbar spine curvature. IMPRESSION: 1. High-grade partial small-bowel obstruction, to the level of the mid small bowel. Most likely related to adhesions. No complicating ischemia. 2. Distended stomach with large hiatal hernia. The patient may benefit from nasogastric tube placement. 3. Small bilateral pleural effusions with left worse than right base airspace disease. On the right, likely atelectasis. On the left, infection or aspiration cannot be excluded. 4. Cardiomegaly with small pericardial effusion. 5.  Aortic Atherosclerosis (ICD10-I70.0). Electronically Signed   By: Abigail Miyamoto M.D.   On: 04/22/19 14:02    Procedures Procedures (including critical care time)  Medications Ordered in ED Medications  sodium chloride 0.9 % bolus 500 mL (0 mLs Intravenous Stopped April 22, 2019 1146)  promethazine (PHENERGAN) injection 12.5 mg (12.5 mg  Intravenous Given 22-Apr-2019 1041)  haloperidol lactate (HALDOL) injection 2 mg (2  mg Intravenous Given 03/28/2019 1223)  pantoprazole (PROTONIX) injection 40 mg (40 mg Intravenous Given 04/09/2019 1222)  iohexol (OMNIPAQUE) 300 MG/ML solution 100 mL (100 mLs Intravenous Contrast Given 03/22/2019 1253)  LORazepam (ATIVAN) injection 1 mg (1 mg Intravenous Given 04/15/2019 1504)  lidocaine (XYLOCAINE) 2 % jelly 1 application (1 application Other Given 04/08/2019 1504)     Initial Impression / Assessment and Plan / ED Course  I have reviewed the triage vital signs and the nursing notes.  Pertinent labs & imaging results that were available during my care of the patient were reviewed by me and considered in my medical decision making (see chart for details).        Patient presenting for evaluation nausea, vomiting, and constipation.  Physical exam shows elderly female who appears nontoxic.  Heart rate irregularly irregular between 11/07/1936, history of A. fib and she has not taken her home medications today.  I am concerned about her abdominal distention, vomiting, and constipation.  She is at high risk for bowel obstruction considering previous abdominal surgeries including a colon resection.  As such, will obtain labs and CT abdomen pelvis for further evaluation.  Patient has continued vomiting despite Zofran, will give Phenergan and reassess.  Case discussed with attending, Dr. Adriana Simasook evaluated the patient.  On reassessment, patient reports continued vomiting despite Phenergan.  Will give Protonix and Haldol for further symptom control.  CT pending. Labs show mild leukocytosis of 12.6.  Additionally, troponin is high normal at 0.03.  This is likely demand ischemia due to her elevated heart rate with A. fib.  She is having no chest pain or shortness of breath, doubt acs.  CT consistent with bowel obstruction.  Patient with a very distended stomach, and large hiatal hernia.  While she is no longer vomiting due to the  medication, place an NG tube.  Will consult with surgery.  Dr. Adriana Simasook discussed with surgery, who will see the patient in the morning in consultation.  Recommends admission to the hospitalist service.  Will call for admission.  D[facility] Marie[facility], [facility] to be [facility].   Final Clinical Impressions(s) / ED Diagnoses   Final diagnoses:  Partial intestinal obstruction, unspecified cause (HCC)  Elevated troponin  Atrial fibrillation with [facility](HCC)    ED Discharge Orders    None       Alveria ApleyCaccavale, Darcell Sabino, PA-C 04-Aug-2019 1613    Donnetta Hutchingook, Brian, MD 03/29/19 [id number]

## 2019-03-25 NOTE — Progress Notes (Signed)
Dr. Denton Brick paged about pts BP- 68/48 after Cardizem gtt decreased to 5mg /hr. Also bladderscan showed 275 in bladder. New order for 555mL bolus and to place foley catheter.  Will continue to monitor pt

## 2019-03-25 NOTE — Progress Notes (Signed)
Pts diamond earrings given to pts son stuart who was in the waiting room. Dr. Denton Brick paged and made aware that pts family was here.

## 2019-03-25 NOTE — ED Notes (Signed)
Ativan and Lidocaine given to assist with NG tube placement.

## 2019-03-25 NOTE — Progress Notes (Signed)
Dr. Denton Brick paged about pts BP 78/50- new order for 536mL bolus x1. Will continue to monitor pt

## 2019-03-25 NOTE — Progress Notes (Signed)
ANTICOAGULATION CONSULT NOTE - Initial Consult  Pharmacy Consult for lovenox Indication: atrial fibrillation  Allergies  Allergen Reactions  . Codeine   . Penicillins   . Sulfa Antibiotics     Patient Measurements: Height: 5\' 4"  (162.6 cm) Weight: 115 lb (52.2 kg) IBW/kg (Calculated) : 54.7   Vital Signs: Temp: 97.7 F (36.5 C) (06/07 0926) Temp Source: Oral (06/07 0926) BP: 118/87 (06/07 1715) Pulse Rate: 149 (06/07 1715)  Labs: Recent Labs    04/01/2019 1117  HGB 16.6*  HCT 53.1*  PLT 304  CREATININE 0.84  TROPONINI 0.03*    Estimated Creatinine Clearance: 38.1 mL/min (by C-G formula based on SCr of 0.84 mg/dL).   Medical History: Past Medical History:  Diagnosis Date  . A-fib ([place])   . Colonic diverticular abscess   . Hypertension   . Osteopenia 09/2014 tab T score -1.1    Medications:  (Not in a hospital admission)   Assessment: Pharmacy consulted to dose lovenox for patient with atrial fibrillation.  Patient was on apixaban prior to admission with last dose given 6/7 AM.  Goal of Therapy:   Monitor platelets by anticoagulation protocol: Yes   Plan:  Lovenox 50 mg subq every 12 hours. Monitor labs and s/s of bleeding.  Carrie Morales 04/01/2019,5:27 PM

## 2019-03-25 NOTE — Progress Notes (Signed)
Patient's HR 180's Cardizem 10 mg administered to patient. MD currently in room assessing patient. Patient to be transferred to step down ICU.

## 2019-03-25 NOTE — Progress Notes (Signed)
RN spoke with Dr. Denton Brick who stated son Carrie Morales insists if he comes and speaks with patient that she will wake up and be alert. This Rn spoke with Abby-AC who will let Security know that pts son is coming. RN will page Dr. Denton Brick when son gets here.  Order to keep NGT clamped for now. Pt placed on bipap per RT. Cardizem gtt increased to 10mg 

## 2019-03-25 NOTE — Progress Notes (Addendum)
Patient admitted to floor patient currently has saturations of 77% and was placed on nonrebreather. HR 170's and sustaining. MD has been paged. Rapid response has been called on patient.   B/P 121/80, HR 165, Respirations 20 and O2 84% NonRebreather 15 L

## 2019-03-25 NOTE — Progress Notes (Signed)
Patient's vitals are currently as follows Respiratory is assessing patient and ABG being obtained.

## 2019-03-25 NOTE — H&P (Signed)
Patient Demographics:    Carrie Morales, is a 85 y.o. female  MRN: [id number]   DOB - 10/06/1928  Admit Date - 03/21/2019  Outpatient Primary MD for the patient is Alinda DeemJannach, Stephen, MD   Assessment & Plan:    Principal Problem:   High Grade SBO (small bowel obstruction)  Active Problems:   PNA (pneumonia)--Aspiration   Essential hypertension   Chronic a-fib  1)Sepsis secondary to community-acquired Pneumonia--- most likely aspiration related, patient has penicillin allergy, treat empirically with IV Rocephin and Flagyl pending blood cultures, lactic acid pending, leukocytosis noted, fevers noted (T-max 102.8), hypotension noted..... Patient has required IV fluid boluses  2)Afib with RVR--- patient with history of chronic A. Fib, now with RVR, continue IV digoxin, titrate IV Cardizem for rate control as BP tolerates, also give IV metoprolol as BP tolerates... Prior to admission patient was on oral metoprolol and digoxin... Rate control remains challenging due to hemodynamic concerns/soft BP in the setting of sepsis, CT abdomen and pelvis suggest small pericardial effusion----get echocardiogram in the morning, get cardiology consult  3)High Grade Partial SBO--continue NG tube, surgical consult from Dr. Henreitta LeberBridges requested, Protonix for GI prophylaxis as ordered, n.p.o. for now  4)Acute hypoxic respiratory failure--- patient developed significant hypoxia, even on a nonrebreather bag and O2 sats was 81% with PO2 of 47 on ABG-----patient had tachypnea and increased work of breathing with persistent hypoxia, she responded well to BiPAP, continue BiPAP for now...  5)Social/Ethics--- plan of care discussed with patient's son and POA (mr Gerald LeitzStuart Jonas)  as well as patient's PCP (Dr. Roselind RilySteve Jannath)----- patient is a full  code with no limitations to treatment  6)Chronic Anticoagulation--- patient with history of A. fib, on chronic anticoagulation, hold Eliquis okay to use subcu Lovenox for now.... May transition to IV Heparin if surgical intervention for bowel obstruction is considered  CRITICAL CARE Performed by: Shon Haleourage Fletcher Ostermiller  After originally admitted inpatient to 3rd floor----patient decompensated and required higher level of care due to persistent tachycardia requiring IV Cardizem bolus and IV Cardizem drip, persistent hypoxia with persistent tachypnea and increased work of breathing not resolved by nonrebreather bag requiring BiPAP therapy.... Persistent hypotension requiring repeated IV fluid boluses.... Sepsis with hemodynamic instability and acute hypoxic respiratory failure requiring critical care for at least 42 minutes  Total critical care time: 42 minutes  Critical care time was exclusive of separately billable procedures and treating other patients.  Critical care was necessary to treat or prevent imminent or life-threatening deterioration.  After originally admitted inpatient to 3rd floor----patient decompensated and required higher level of care due to persistent tachycardia requiring IV Cardizem bolus and IV Cardizem drip, persistent hypoxia with persistent tachypnea and increased work of breathing not resolved by nonrebreather bag requiring BiPAP therapy.... Persistent hypotension requiring repeated IV fluid boluses.... Sepsis with hemodynamic instability and acute hypoxic respiratory failure requiring critical care for at least 42 minutes  Critical care was time spent personally by me on  the following activities: development of treatment plan with patient and/or surrogate as well as nursing, discussions with consultants, evaluation of patient's response to treatment, examination of patient, obtaining history from patient or surrogate, ordering and performing treatments and interventions, ordering  and review of laboratory studies, ordering and review of radiographic studies, pulse oximetry and re-evaluation of patient's condition.   With History of - Reviewed by me  Past Medical History:  Diagnosis Date   A-fib [facility])    Colonic diverticular abscess    Hypertension    Osteopenia 09/2014 tab T score -1.1      Past Surgical History:  Procedure Laterality Date   ABDOMINAL HYSTERECTOMY  1971   APPENDECTOMY  1958   COLON RESECTION  1991   ECTOPIC PREGNANCY SURGERY  1958   LSO   OVARIAN CYST SURGERY  1972   right   REPLACEMENT TOTAL KNEE     X 2    Chief Complaint  Patient presents with   Abdominal Pain      HPI:    Carrie Morales  is a 85 y.o. female with past medical history relevant for chronic A. fib on chronic anticoagulation with Eliquis, dCHF,hypertension and history of multiple laparotomies/abdominal surgeries including prior colon resection (30 yrs ago for colonic diverticular abscess), prior appendectomy, prior abdominal hysterectomy, prior ectopic pregnancy surgery with LSO, prior ovarian cyst surgery on the right who presents with concerns about constipation and abdominal pain and recurrent emesis--   Domino pain and emesis as persisted on and off for over a week but became more persistent over the last couple days--- patient tried milk of magnesia at home  According to patient's son and patient's PCP (Dr. Roselind Rily)  who were able to give me additional information patient has been in relatively good health until this episode of nausea vomiting and abdominal pain  In ED... Lipase was not elevated, creatinine was 0.84, WBC was 12.6, hemoglobin was 16.6  In the hospital patient was found to be febrile with T-max of 102.8  CT abd/pelvis showed high-grade partial small bowel obstruction to the level of the mid small bowel most likely from adhesions, without ischemia.... Patient also found to have possible aspiration pneumonia on the left as  well as cardiomegaly with small pericardial effusion  NG tube was placed in the ED with over 1100 mL of somewhat greenish gastric output out   After originally admitted inpatient to 3rd floor----patient decompensated and required higher level of care due to persistent tachycardia requiring IV Cardizem bolus and IV Cardizem drip, persistent hypoxia with persistent tachypnea and increased work of breathing not resolved by nonrebreather bag requiring BiPAP therapy.... Persistent hypotension requiring repeated IV fluid boluses.... Sepsis with hemodynamic instability and acute hypoxic respiratory failure requiring critical care for at least 42 minutes   Review of systems:    In addition to the HPI above,   A full Review of  Systems was done, all other systems reviewed are negative except as noted above in HPI , .    Social History:  Reviewed by me   Social History   Tobacco Use   Smoking status: Former Smoker   Smokeless tobacco: Never Used  Substance Use Topics   Alcohol use: Not Currently    Comment: Occas    Family History :  Reviewed by me    Family History  Problem Relation Age of Onset   Cancer Brother        Lung   Cancer Brother  Bladder     Home Medications:   Prior to Admission medications   Medication Sig Start Date End Date Taking? Authorizing Provider  apixaban (ELIQUIS) 2.5 MG TABS tablet Take 2.5 mg by mouth 2 (two) times daily.    Yes [provider]  digoxin (LANOXIN) 0.125 MG tablet Take 0.125 mg by mouth daily.   Yes [provider]  furosemide (LASIX) 20 MG tablet Take 20 mg by mouth See admin instructions. Take one tablet by mouth 5 days per week   Yes [provider]  losartan (COZAAR) 50 MG tablet Take 50 mg by mouth 2 (two) times a day.   Yes [provider]  metoprolol tartrate (LOPRESSOR) 100 MG tablet Take 100 mg by mouth 2 (two) times daily.   Yes [provider]  ondansetron (ZOFRAN) 4 MG  tablet Take 4 mg by mouth daily as needed for nausea or vomiting.   Yes [provider]  potassium chloride (K-DUR) 10 MEQ tablet Take 10 mEq by mouth See admin instructions. Take one tablet by mouth 5 days per week   Yes [provider]  aspirin 81 MG tablet Take 81 mg by mouth daily.    [provider]  Calcium Carbonate-Vitamin D (CALCIUM + D PO) Take 1 tablet by mouth daily.     [provider]  Cholecalciferol (VITAMIN D PO) Take 1 capsule by mouth daily.     [provider]  Multiple Vitamin (MULTIVITAMIN) tablet Take 1 tablet by mouth daily.    [provider]    Allergies:     Allergies  Allergen Reactions   Codeine    Penicillins    Sulfa Antibiotics      Physical Exam:   Vitals  Blood pressure 119/90, pulse (!) 102, temperature (!) 100.7 F (38.2 C), temperature source Axillary, resp. rate (!) 43, height  (1.626 m), weight 47.6 kg, SpO2 96 %.  Physical Examination: General appearance - alert, tachypneic, tachycardic, increased work of breathing Mental status - alert, oriented to person, place, and time-initially oriented x3, ,patient became confused after lorazepam in the ED  eyes - sclera anicteric Nose- NG tube Face- Bipap mask Neck - supple, no JVD elevation , Chest -diminished in bases with bilateral rhonchi Heart - S1 and S2 normal, irregularly irregular and tachycardic  abdomen - soft, slightly distended, mildly uncomfortable with palpation, no rebound or guarding, bowel sounds diminished Neurological - , neck supple without rigidity, cranial nerves II through XII intact, DTR's normal and symmetric Extremities - no pedal edema noted, intact peripheral pulses  Skin - warm, dry   Data Review:    CBC Recent Labs  Lab 04/12/19 1117  WBC 12.6*  HGB 16.6*  HCT 53.1*  PLT 304  MCV 96.2  MCH 30.1  MCHC 31.3  RDW 13.4  LYMPHSABS 0.8  MONOABS 0.5  EOSABS 0.0  BASOSABS 0.0    ------------------------------------------------------------------------------------------------------------------  Chemistries  Recent Labs  Lab 04-12-2019 1117  NA 138  K 4.5  CL 95*  CO2 27  GLUCOSE 163*  BUN 20  CREATININE 0.84  CALCIUM 10.3  AST 25  ALT 25  ALKPHOS 67  BILITOT 1.5*   ------------------------------------------------------------------------------------------------------------------ estimated creatinine clearance is 34.8 mL/min (by C-G formula based on SCr of 0.84 mg/dL). ------------------------------------------------------------------------------------------------------------------ No results for input(s): TSH, T4TOTAL, T3FREE, THYROIDAB in the last 72 hours.  Invalid input(s): FREET3   Coagulation profile No results for input(s): INR, PROTIME in the last 168 hours. ------------------------------------------------------------------------------------------------------------------- No results  for input(s): DDIMER in the last 72 hours. -------------------------------------------------------------------------------------------------------------------  Cardiac Enzymes Recent Labs  Lab 06/09/2019 1117  TROPONINI 0.03*   ------------------------------------------------------------------------------------------------------------------ No results found for: BNP  ---------------------------------------------------------------------------------------------------------------  Urinalysis    Component Value Date/Time   COLORURINE AMBER (A) 08/22/202020 1158   APPEARANCEUR HAZY (A) 08/22/202020 1158   LABSPEC 1.027 08/22/202020 1158   PHURINE 5.0 08/22/202020 1158   GLUCOSEU NEGATIVE 08/22/202020 1158   HGBUR NEGATIVE 08/22/202020 1158   BILIRUBINUR NEGATIVE 08/22/202020 1158   KETONESUR 20 (A) 08/22/202020 1158   PROTEINUR 100 (A) 08/22/202020 1158   UROBILINOGEN 0.2 07/24/2014 1534   NITRITE NEGATIVE 08/22/202020 1158   LEUKOCYTESUR NEGATIVE 08/22/202020 1158     ----------------------------------------------------------------------------------------------------------------   Imaging Results:    Ct Abdomen Pelvis W Contrast  Result Date: 03/29/2019 CLINICAL DATA:  Constipation since Thursday. Vomiting. Hysterectomy. Appendectomy. High-grade bowel obstruction. EXAM: CT ABDOMEN AND PELVIS WITH CONTRAST TECHNIQUE: Multidetector CT imaging of the abdomen and pelvis was performed using the standard protocol following bolus administration of intravenous contrast. CONTRAST:  100mL OMNIPAQUE IOHEXOL 300 MG/ML  SOLN COMPARISON:  None. FINDINGS: Lower chest: Right base atelectasis. Left base collapse/consolidative change. Moderate cardiomegaly with small pericardial effusion. Small bilateral pleural effusions. A large hiatal hernia, with approximately 1/2 of the stomach positioned in the lower chest. Hepatobiliary: Anterior left hepatic lobe cyst. Cholecystectomy, without biliary ductal dilatation. Pancreas: Normal, without mass or ductal dilatation. Spleen: Normal in size, without focal abnormality. Adrenals/Urinary Tract: Normal adrenal glands. Bilateral renal low-density lesions which are likely cysts and minimally complex cysts. Other renal lesions are too small to characterize. No hydronephrosis. Normal urinary bladder. Stomach/Bowel: The stomach is distended and fluid-filled. The left side of the colon is relatively decompressed with scattered diverticula. Proximal and mid small bowel loops are dilated including at up to 2.9 cm. This continues to the level of a transition point within the central pelvis, likely in the proximal ileum on image 55/3 and coronal image 41. No obstructive mass identified. No complicating ischemia. Vascular/Lymphatic: Aortic and branch vessel atherosclerosis. No abdominopelvic adenopathy. Reproductive: Hysterectomy.  No adnexal mass. Other: Mild pelvic floor laxity.  No free intraperitoneal air. Musculoskeletal: Lumbosacral spondylosis with  moderate convex left lumbar spine curvature. IMPRESSION: 1. High-grade partial small-bowel obstruction, to the level of the mid small bowel. Most likely related to adhesions. No complicating ischemia. 2. Distended stomach with large hiatal hernia. The patient may benefit from nasogastric tube placement. 3. Small bilateral pleural effusions with left worse than right base airspace disease. On the right, likely atelectasis. On the left, infection or aspiration cannot be excluded. 4. Cardiomegaly with small pericardial effusion. 5.  Aortic Atherosclerosis (ICD10-I70.0). Electronically Signed   By: Jeronimo GreavesKyle  Talbot M.D.   On: 08/22/202020 14:02   Dg Chest Port 1 View  Result Date: 03/24/2019 CLINICAL DATA:  NG tube placement. EXAM: PORTABLE CHEST 1 VIEW COMPARISON:  08/22/202020 and CT 08/22/202020 FINDINGS: Patient is slightly rotated to the left. Nasogastric tube is coiled once over the stomach in patient's known large hiatal hernia and then proceeds inferiorly over the left upper quadrant and off the film as tip is not visualized. Non radiopaque side-port is over the stomach in the left upper abdomen. Exam demonstrates adequate lung volumes with worsening bilateral perihilar opacification suggesting interstitial edema and less likely infection. Persistent opacification over the left base likely effusion with atelectasis. Stable mild hazy density over the right base which may represent a small effusion with atelectasis. Infection over the lung bases is possible. Stable cardiomegaly.  Remainder of the exam is unchanged. IMPRESSION: Worsening bilateral perihilar opacification suggesting worsening interstitial edema. Stable bibasilar opacification likely small effusions left greater than right with associated basilar atelectasis. Infection in the mid to lower lungs is possible. Stable cardiomegaly. Nasogastric tube coiled once over the large hiatal hernia with side-port over the stomach in the left upper quadrant and tip not  visualized. Electronically Signed   By: Elberta Fortisaniel  Boyle M.D.   On: 04/04/2019 20:28   Dg Chest Portable 1 View  Result Date: 04/13/2019 CLINICAL DATA:  Nasogastric tube placement EXAM: PORTABLE CHEST 1 VIEW COMPARISON:  Abdominal CT from earlier today FINDINGS: Nasogastric tube which at least reaches the stomach (which is low lying and distended by CT). There is haziness at both lung bases where there is atelectasis and pleural fluid by prior CT. Cardiopericardial enlargement. IMPRESSION: 1. The nasogastric tube reaches the stomach at least. 2. Cardiomegaly with bilateral pleural effusion obscuring the lower lobes. Electronically Signed   By: Marnee SpringJonathon  Watts M.D.   On: 03/19/2019 16:36    Radiological Exams on Admission: Ct Abdomen Pelvis W Contrast  Result Date: 04/13/2019 CLINICAL DATA:  Constipation since Thursday. Vomiting. Hysterectomy. Appendectomy. High-grade bowel obstruction. EXAM: CT ABDOMEN AND PELVIS WITH CONTRAST TECHNIQUE: Multidetector CT imaging of the abdomen and pelvis was performed using the standard protocol following bolus administration of intravenous contrast. CONTRAST:  100mL OMNIPAQUE IOHEXOL 300 MG/ML  SOLN COMPARISON:  None. FINDINGS: Lower chest: Right base atelectasis. Left base collapse/consolidative change. Moderate cardiomegaly with small pericardial effusion. Small bilateral pleural effusions. A large hiatal hernia, with approximately 1/2 of the stomach positioned in the lower chest. Hepatobiliary: Anterior left hepatic lobe cyst. Cholecystectomy, without biliary ductal dilatation. Pancreas: Normal, without mass or ductal dilatation. Spleen: Normal in size, without focal abnormality. Adrenals/Urinary Tract: Normal adrenal glands. Bilateral renal low-density lesions which are likely cysts and minimally complex cysts. Other renal lesions are too small to characterize. No hydronephrosis. Normal urinary bladder. Stomach/Bowel: The stomach is distended and fluid-filled. The left  side of the colon is relatively decompressed with scattered diverticula. Proximal and mid small bowel loops are dilated including at up to 2.9 cm. This continues to the level of a transition point within the central pelvis, likely in the proximal ileum on image 55/3 and coronal image 41. No obstructive mass identified. No complicating ischemia. Vascular/Lymphatic: Aortic and branch vessel atherosclerosis. No abdominopelvic adenopathy. Reproductive: Hysterectomy.  No adnexal mass. Other: Mild pelvic floor laxity.  No free intraperitoneal air. Musculoskeletal: Lumbosacral spondylosis with moderate convex left lumbar spine curvature. IMPRESSION: 1. High-grade partial small-bowel obstruction, to the level of the mid small bowel. Most likely related to adhesions. No complicating ischemia. 2. Distended stomach with large hiatal hernia. The patient may benefit from nasogastric tube placement. 3. Small bilateral pleural effusions with left worse than right base airspace disease. On the right, likely atelectasis. On the left, infection or aspiration cannot be excluded. 4. Cardiomegaly with small pericardial effusion. 5.  Aortic Atherosclerosis (ICD10-I70.0). Electronically Signed   By: Jeronimo GreavesKyle  Talbot M.D.   On: 04/11/2019 14:02   Dg Chest Port 1 View  Result Date: 04/04/2019 CLINICAL DATA:  NG tube placement. EXAM: PORTABLE CHEST 1 VIEW COMPARISON:  03/31/2019 and CT 04/11/2019 FINDINGS: Patient is slightly rotated to the left. Nasogastric tube is coiled once over the stomach in patient's known large hiatal hernia and then proceeds inferiorly over the left upper quadrant and off the film as tip is not visualized. Non radiopaque side-port is over the stomach  in the left upper abdomen. Exam demonstrates adequate lung volumes with worsening bilateral perihilar opacification suggesting interstitial edema and less likely infection. Persistent opacification over the left base likely effusion with atelectasis. Stable mild hazy  density over the right base which may represent a small effusion with atelectasis. Infection over the lung bases is possible. Stable cardiomegaly. Remainder of the exam is unchanged. IMPRESSION: Worsening bilateral perihilar opacification suggesting worsening interstitial edema. Stable bibasilar opacification likely small effusions left greater than right with associated basilar atelectasis. Infection in the mid to lower lungs is possible. Stable cardiomegaly. Nasogastric tube coiled once over the large hiatal hernia with side-port over the stomach in the left upper quadrant and tip not visualized. Electronically Signed   By: Marin Olp M.D.   On: 16-Apr-2019 20:28   Dg Chest Portable 1 View  Result Date: 2019/04/16 CLINICAL DATA:  Nasogastric tube placement EXAM: PORTABLE CHEST 1 VIEW COMPARISON:  Abdominal CT from earlier today FINDINGS: Nasogastric tube which at least reaches the stomach (which is low lying and distended by CT). There is haziness at both lung bases where there is atelectasis and pleural fluid by prior CT. Cardiopericardial enlargement. IMPRESSION: 1. The nasogastric tube reaches the stomach at least. 2. Cardiomegaly with bilateral pleural effusion obscuring the lower lobes. Electronically Signed   By: Monte Fantasia M.D.   On: Apr 16, 2019 16:36    DVT Prophylaxis -SCD /lovenox AM Labs Ordered, also please review Full Orders  Family Communication: Admission, patients condition and plan of care including tests being ordered have been discussed with the patient and son/POA who indicate understanding and agree with the plan   Code Status - Full Code  Likely DC to  TBD  Condition   stable  Roxan Hockey M.D on 2019-04-16 at 11:40 PM Go to [email or web address] -  for contact info  [facility] - Office  [phone]

## 2019-03-25 NOTE — ED Notes (Signed)
CRITICAL VALUE ALERT  Critical Value:  Trop 0.03  Date & Time Notied:  2019/04/01, 1241  Provider Notified: Dr. Lacinda Axon  Orders Received/Actions taken: no new orders

## 2019-03-26 ENCOUNTER — Inpatient Hospital Stay (HOSPITAL_COMMUNITY): Payer: Medicare Other

## 2019-03-26 ENCOUNTER — Encounter (HOSPITAL_COMMUNITY): Payer: Self-pay

## 2019-03-26 DIAGNOSIS — I482 Chronic atrial fibrillation, unspecified: Secondary | ICD-10-CM

## 2019-03-26 DIAGNOSIS — J9690 Respiratory failure, unspecified, unspecified whether with hypoxia or hypercapnia: Secondary | ICD-10-CM

## 2019-03-26 DIAGNOSIS — I4891 Unspecified atrial fibrillation: Secondary | ICD-10-CM

## 2019-03-26 DIAGNOSIS — I5031 Acute diastolic (congestive) heart failure: Secondary | ICD-10-CM

## 2019-03-26 DIAGNOSIS — I340 Nonrheumatic mitral (valve) insufficiency: Secondary | ICD-10-CM

## 2019-03-26 DIAGNOSIS — K56609 Unspecified intestinal obstruction, unspecified as to partial versus complete obstruction: Secondary | ICD-10-CM

## 2019-03-26 DIAGNOSIS — I361 Nonrheumatic tricuspid (valve) insufficiency: Secondary | ICD-10-CM

## 2019-03-26 LAB — BRAIN NATRIURETIC PEPTIDE: B Natriuretic Peptide: 950 pg/mL — ABNORMAL HIGH (ref 0.0–100.0)

## 2019-03-26 LAB — PROCALCITONIN: Procalcitonin: 5.86 ng/mL

## 2019-03-26 LAB — CBC
HCT: 38.8 % (ref 36.0–46.0)
Hemoglobin: 12.3 g/dL (ref 12.0–15.0)
MCH: 30.4 pg (ref 26.0–34.0)
MCHC: 31.7 g/dL (ref 30.0–36.0)
MCV: 95.8 fL (ref 80.0–100.0)
Platelets: 237 10*3/uL (ref 150–400)
RBC: 4.05 MIL/uL (ref 3.87–5.11)
RDW: 13.6 % (ref 11.5–15.5)
WBC: 13.9 10*3/uL — ABNORMAL HIGH (ref 4.0–10.5)
nRBC: 0 % (ref 0.0–0.2)

## 2019-03-26 LAB — HEMOGLOBIN A1C
Hgb A1c MFr Bld: 5.3 % (ref 4.8–5.6)
Mean Plasma Glucose: 105.41 mg/dL

## 2019-03-26 LAB — BASIC METABOLIC PANEL
Anion gap: 10 (ref 5–15)
BUN: 29 mg/dL — ABNORMAL HIGH (ref 8–23)
CO2: 25 mmol/L (ref 22–32)
Calcium: 9 mg/dL (ref 8.9–10.3)
Chloride: 103 mmol/L (ref 98–111)
Creatinine, Ser: 1.23 mg/dL — ABNORMAL HIGH (ref 0.44–1.00)
GFR calc Af Amer: 45 mL/min — ABNORMAL LOW (ref >60)
GFR calc non Af Amer: 39 mL/min — ABNORMAL LOW (ref >60)
Glucose, Bld: 200 mg/dL — ABNORMAL HIGH (ref 70–99)
Potassium: 4 mmol/L (ref 3.5–5.1)
Sodium: 138 mmol/L (ref 135–145)

## 2019-03-26 LAB — ECHOCARDIOGRAM COMPLETE
Height: 64 in
Weight: 1703.71 oz

## 2019-03-26 LAB — GLUCOSE, CAPILLARY
Glucose-Capillary: 129 mg/dL — ABNORMAL HIGH (ref 70–99)
Glucose-Capillary: 88 mg/dL (ref 70–99)
Glucose-Capillary: 98 mg/dL (ref 70–99)

## 2019-03-26 LAB — MRSA PCR SCREENING: MRSA by PCR: NEGATIVE

## 2019-03-26 LAB — LACTIC ACID, PLASMA: Lactic Acid, Venous: 2.3 mmol/L (ref 0.5–1.9)

## 2019-03-26 LAB — APTT: aPTT: 35 seconds (ref 24–36)

## 2019-03-26 LAB — HEPARIN LEVEL (UNFRACTIONATED): Heparin Unfractionated: 1.16 IU/mL — ABNORMAL HIGH (ref 0.30–0.70)

## 2019-03-26 MED ORDER — INSULIN ASPART 100 UNIT/ML ~~LOC~~ SOLN
0.0000 [IU] | SUBCUTANEOUS | Status: DC
  Administered 2019-03-26: 12:00:00 1 [IU] via SUBCUTANEOUS

## 2019-03-26 MED ORDER — ORAL CARE MOUTH RINSE
15.0000 mL | Freq: Two times a day (BID) | OROMUCOSAL | Status: DC
  Administered 2019-03-26 – 2019-03-29 (×8): 15 mL via OROMUCOSAL

## 2019-03-26 MED ORDER — FUROSEMIDE 10 MG/ML IJ SOLN
20.0000 mg | Freq: Once | INTRAMUSCULAR | Status: AC
  Administered 2019-03-26: 10:00:00 20 mg via INTRAVENOUS
  Filled 2019-03-26: qty 2

## 2019-03-26 MED ORDER — SODIUM CHLORIDE 0.9 % IV SOLN
1000.0000 mg | Freq: Two times a day (BID) | INTRAVENOUS | Status: DC
  Administered 2019-03-26 – 2019-04-03 (×18): 1000 mg via INTRAVENOUS
  Filled 2019-03-26 (×19): qty 1

## 2019-03-26 MED ORDER — HEPARIN (PORCINE) 25000 UT/250ML-% IV SOLN
1300.0000 [IU]/h | INTRAVENOUS | Status: DC
  Administered 2019-03-26: 600 [IU]/h via INTRAVENOUS
  Administered 2019-03-28: 15:00:00 500 [IU]/h via INTRAVENOUS
  Administered 2019-03-30: 700 [IU]/h via INTRAVENOUS
  Administered 2019-03-31 – 2019-04-02 (×4): 1300 [IU]/h via INTRAVENOUS
  Filled 2019-03-26 (×7): qty 250

## 2019-03-26 MED ORDER — CHLORHEXIDINE GLUCONATE 0.12 % MT SOLN
15.0000 mL | Freq: Two times a day (BID) | OROMUCOSAL | Status: DC
  Administered 2019-03-26 – 2019-03-28 (×5): 15 mL via OROMUCOSAL
  Filled 2019-03-26 (×4): qty 15

## 2019-03-26 MED ORDER — SODIUM CHLORIDE 0.9% FLUSH: 10.0000 mL | INTRAVENOUS | Status: DC | PRN

## 2019-03-26 MED ORDER — SODIUM CHLORIDE 0.9% FLUSH
10.0000 mL | Freq: Two times a day (BID) | INTRAVENOUS | Status: DC
  Administered 2019-03-26 – 2019-04-04 (×14): 10 mL
  Administered 2019-04-05: 11:00:00 20 mL
  Administered 2019-04-07 – 2019-04-10 (×4): 10 mL

## 2019-03-26 MED ORDER — CHLORHEXIDINE GLUCONATE CLOTH 2 % EX PADS
6.0000 | MEDICATED_PAD | Freq: Every day | CUTANEOUS | Status: DC
  Administered 2019-03-27 – 2019-04-10 (×15): 6 via TOPICAL

## 2019-03-26 NOTE — Progress Notes (Signed)
Peripherally Inserted Central Catheter/Midline Placement  The IV Nurse has discussed with the patient and/or persons authorized to consent for the patient, the purpose of this procedure and the potential benefits and risks involved with this procedure.  The benefits include less needle sticks, lab draws from the catheter, and the patient may be discharged home with the catheter. Risks include, but not limited to, infection, bleeding, blood clot (thrombus formation), and puncture of an artery; nerve damage and irregular heartbeat and possibility to perform a PICC exchange if needed/ordered by physician.  Alternatives to this procedure were also discussed.  Bard Power PICC patient education guide, fact sheet on infection prevention and patient information card has been provided to patient /or left at bedside.    PICC/Midline Placement Documentation  PICC Double Lumen 03/26/19 PICC Left Brachial 42 cm 1 cm (Active)  Indication for Insertion or Continuance of Line Prolonged intravenous therapies 03/26/2019 11:00 AM  Exposed Catheter (cm) 1 cm 03/26/2019 11:00 AM  Site Assessment Clean;Dry;Intact 03/26/2019 11:00 AM  Lumen #1 Status Flushed;Blood return noted 03/26/2019 11:00 AM  Lumen #2 Status Flushed;Blood return noted 03/26/2019 11:00 AM  Dressing Type Transparent 03/26/2019 11:00 AM  Dressing Status Clean;Dry;Intact;Antimicrobial disc in place 03/26/2019 11:00 AM  Dressing Change Due 04/02/19 03/26/2019 11:00 AM       Jule Economy Horton 03/26/2019, 11:59 AM

## 2019-03-26 NOTE — Progress Notes (Signed)
*  PRELIMINARY RESULTS* Echocardiogram 2D Echocardiogram has been performed.  Carrie Morales 03/26/2019, 10:51 AM

## 2019-03-26 NOTE — Progress Notes (Signed)
[facility]  Talked to son and patient. Patient would want intubation for trial period 4-5 days to see if improving but would not want a tracheostomy. Patient does not want chest compressions but wants everything else done to try to save her life if possible. She does not want to go through broken ribs, etc.   Incentive spirometry ordered and patient instructed on use.  NG in place Ice 30cc / hour ordered  Updated Dr .Carles Collet.    Curlene Labrum, MD [facility] [address] [place], [place] [address] [phone] (office)

## 2019-03-26 NOTE — Progress Notes (Signed)
ANTICOAGULATION CONSULT NOTE - Initial Consult  Pharmacy Consult for lovenox--> heparin Indication: atrial fibrillation  Allergies  Allergen Reactions  . Codeine   . Penicillins   . Sulfa Antibiotics     Patient Measurements: Height: 5\' 4"  (162.6 cm) Weight: 106 lb 7.7 oz (48.3 kg) IBW/kg (Calculated) : 54.7  HEPARIN DW (KG): 47.6  Vital Signs: Temp: 99 F (37.2 C) (06/08 0700) Temp Source: Axillary (06/07 2040) BP: 111/50 (06/08 0700) Pulse Rate: 38 (06/08 0700)  Labs: Recent Labs    04/14/2019 1117 03/26/19 0418  HGB 16.6* 12.3  HCT 53.1* 38.8  PLT 304 237  CREATININE 0.84 1.23*  TROPONINI 0.03*  --     Estimated Creatinine Clearance: 24.1 mL/min (A) (by C-G formula based on SCr of 1.23 mg/dL (H)).   Medical History: Past Medical History:  Diagnosis Date  . A-fib ([place])   . Colonic diverticular abscess   . Hypertension   . Osteopenia 09/2014 tab T score -1.1    Medications:  Medications Prior to Admission  Medication Sig Dispense Refill Last Dose  . apixaban (ELIQUIS) 2.5 MG TABS tablet Take 2.5 mg by mouth 2 (two) times daily.    03/24/2019 at Unknown time  . digoxin (LANOXIN) 0.125 MG tablet Take 0.125 mg by mouth daily.   03/24/2019 at Unknown time  . furosemide (LASIX) 20 MG tablet Take 20 mg by mouth See admin instructions. Take one tablet by mouth 5 days per week   03/23/2019 at Unknown time  . losartan (COZAAR) 50 MG tablet Take 50 mg by mouth 2 (two) times a day.   03/24/2019 at Unknown time  . metoprolol tartrate (LOPRESSOR) 100 MG tablet Take 100 mg by mouth 2 (two) times daily.   03/24/2019 at Unknown time  . ondansetron (ZOFRAN) 4 MG tablet Take 4 mg by mouth daily as needed for nausea or vomiting.   03/24/2019 at Unknown time  . potassium chloride (K-DUR) 10 MEQ tablet Take 10 mEq by mouth See admin instructions. Take one tablet by mouth 5 days per week   03/23/2019 at Unknown time  . aspirin 81 MG tablet Take 81 mg by mouth daily.   VERIFY  . Calcium  Carbonate-Vitamin D (CALCIUM + D PO) Take 1 tablet by mouth daily.    VERIFY  . Cholecalciferol (VITAMIN D PO) Take 1 capsule by mouth daily.    VERIFY  . Multiple Vitamin (MULTIVITAMIN) tablet Take 1 tablet by mouth daily.   VERIFY    Assessment: Pharmacy consulted to dose heparin(was on lovenox) for patient with atrial fibrillation. Renal function worsened so switching to heparin.  Patient was on apixaban prior to admission with last dose given 6/7 AM, last dose of lovenox given 6/7 at 2100. Will order APTT and HL baseline. Monitor with APTT levels to adjust dosing d/t xarelto interference, until APTT and HL correlate Will start heparin at 2100(24 hours after last lovenox dose).   Goal of Therapy:  APTT 66-102 Heparin level 0.3-0.7 Monitor platelets by anticoagulation protocol: Yes   Plan:  Start heparin infusion at 600 units/hr at 2100 Check APTT and anti-Xa level in 8 hours and daily while on heparin Continue to monitor H&H and platelets  Isac Sarna, BS [place], BCPS Clinical Pharmacist Pager [phone] 03/26/2019,8:38 AM

## 2019-03-26 NOTE — Consult Note (Signed)
[facility] Consult  Reason for Consult: SBO  Referring Physician:  Dr. Arbutus Leas   Chief Complaint    Abdominal Pain      Carrie Morales is a 85 y.o. female.  HPI: Carrie Morales is an 85 yo comes in with reports of abdominal distention and vomiting since last week. She had not had a BM since Thursday, and has also been vomiting. She did not complain of pain per her son, but was unable to eat or drink. She came to the Ed and was found to be in A fib with RVR and also had concerns for aspiration PNA.  She had an NG placed last night and I reviewed her CT scan.  She was admitted for bowel rest and receiving treatment for A fib.    The patient was currently on Bipap, and is able to answer questions appropriately. She says she has had 4-5 surgeries in the past including an ectopic pregnancy, ovary cyst, Ex lap for diverticulitis with obstruction, and also had a hysterectomy. She reports prior SBO that required NG placement.  Her son says that she recently lost her husband and was just recently diagnosed with A fib.  She is on Eliquis at home.   Past Medical History:  Diagnosis Date  . Colonic diverticular abscess   . Essential hypertension   . Osteopenia    09/2014 tab T score -1.1  . Permanent atrial fibrillation    Diagnosed December 2019 - Dr. Kathryne Sharper    Past Surgical History:  Procedure Laterality Date  . ABDOMINAL HYSTERECTOMY  1971  . APPENDECTOMY  1958  . COLON RESECTION  1991  . ECTOPIC PREGNANCY SURGERY  1958   LSO  . OVARIAN CYST SURGERY  1972   right  . REPLACEMENT TOTAL KNEE     X 2    Family History  Problem Relation Age of Onset  . Cancer Brother        Lung  . Cancer Brother        Bladder    Social History   Tobacco Use  . Smoking status: Former Games developer  . Smokeless tobacco: Never Used  Substance Use Topics  . Alcohol use: Not Currently    Comment: Occas  . Drug use: Not on file    Medications:  I have reviewed the patient's  current medications. Prior to Admission:  Medications Prior to Admission  Medication Sig Dispense Refill Last Dose  . apixaban (ELIQUIS) 2.5 MG TABS tablet Take 2.5 mg by mouth 2 (two) times daily.    03/24/2019 at Unknown time  . aspirin 81 MG tablet Take 81 mg by mouth daily.   unknown  . Calcium Carbonate-Vitamin D (CALCIUM + D PO) Take 1 tablet by mouth daily.    unknown  . digoxin (LANOXIN) 0.125 MG tablet Take 0.125 mg by mouth daily.   03/24/2019 at Unknown time  . furosemide (LASIX) 20 MG tablet Take 20 mg by mouth See admin instructions. Take one tablet by mouth daily 5 days per week   03/23/2019 at Unknown time  . losartan (COZAAR) 50 MG tablet Take 50 mg by mouth daily.    03/24/2019 at Unknown time  . metoprolol tartrate (LOPRESSOR) 100 MG tablet Take 100 mg by mouth 2 (two) times daily.    03/24/2019 at Unknown time  . Multiple Vitamin (MULTIVITAMIN) tablet Take 1 tablet by mouth daily.   03/24/2019  . ondansetron (ZOFRAN) 4 MG tablet Take 4 mg by mouth daily as needed  for nausea or vomiting.   03/24/2019 at Unknown time  . potassium chloride (K-DUR) 10 MEQ tablet Take 10 mEq by mouth See admin instructions. Take one tablet daily by mouth 5 days per week   03/23/2019 at Unknown time  . Cholecalciferol (VITAMIN D PO) Take 1 capsule by mouth daily.    03/24/2019   Scheduled: . chlorhexidine  15 mL Mouth Rinse BID  . Chlorhexidine Gluconate Cloth  6 each Topical Daily  . Chlorhexidine Gluconate Cloth  6 each Topical Daily  . insulin aspart  0-9 Units Subcutaneous Q4H  . mouth rinse  15 mL Mouth Rinse q12n4p  . pantoprazole (PROTONIX) IV  40 mg Intravenous Q24H  . sodium chloride flush  10-40 mL Intracatheter Q12H  . sodium chloride flush  3 mL Intravenous Q12H   Continuous: . sodium chloride 250 mL (03/26/19 0934)  . diltiazem (CARDIZEM) infusion 5 mg/hr (03/26/19 [id number])  . heparin    . meropenem (MERREM) IV 1,000 mg (03/26/19 0949)   JJO:ACZYSAPRN:sodium chloride, acetaminophen **OR** acetaminophen,  albuterol, ondansetron **OR** ondansetron (ZOFRAN) IV, polyethylene glycol, sodium chloride flush, sodium chloride flush, traZODone    ROS:  A comprehensive review of systems was negative except for: Respiratory: positive for SOB Cardiovascular: positive for tachycardia Gastrointestinal: positive for constipation, nausea and vomiting  Blood pressure (!) 113/56, pulse 72, temperature 99.7 F (37.6 C), resp. rate (!) 30, height 5\' 4"  (1.626 m), weight 48.3 kg, SpO2 98 %. Physical Exam Vitals signs reviewed.  Constitutional:      Appearance: She is well-developed.  HENT:     Head: Normocephalic.  Cardiovascular:     Rate and Rhythm: Normal rate.  Pulmonary:     Effort: Pulmonary effort is normal.  Abdominal:     General: There is distension.     Tenderness: There is no abdominal tenderness. There is no guarding or rebound.     Hernia: No hernia is present.     Comments: Healed midline scar  Skin:    General: Skin is warm and dry.  Neurological:     General: No focal deficit present.     Mental Status: She is alert and oriented to person, place, and time.  Psychiatric:        Mood and Affect: Mood normal.        Behavior: Behavior normal.     Results: Results for orders placed or performed during the hospital encounter of 25-Oct-2018 (from the past 48 hour(s))  CBC with Differential     Status: Abnormal   Collection Time: 25-Oct-2018 11:17 AM  Result Value Ref Range   WBC 12.6 (H) 4.0 - 10.5 K/uL   RBC 5.52 (H) 3.87 - 5.11 MIL/uL   Hemoglobin 16.6 (H) 12.0 - 15.0 g/dL   HCT 63.053.1 (H) 16.036.0 - 10.946.0 %   MCV 96.2 80.0 - 100.0 fL   MCH 30.1 26.0 - 34.0 pg   MCHC 31.3 30.0 - 36.0 g/dL   RDW 32.313.4 55.711.5 - 32.215.5 %   Platelets 304 150 - 400 K/uL   nRBC 0.0 0.0 - 0.2 %   Neutrophils Relative % 90 %   Neutro Abs 11.2 (H) 1.7 - 7.7 K/uL   Lymphocytes Relative 6 %   Lymphs Abs 0.8 0.7 - 4.0 K/uL   Monocytes Relative 4 %   Monocytes Absolute 0.5 0.1 - 1.0 K/uL   Eosinophils Relative 0 %    Eosinophils Absolute 0.0 0.0 - 0.5 K/uL   Basophils Relative 0 %  Basophils Absolute 0.0 0.0 - 0.1 K/uL   Immature Granulocytes 0 %   Abs Immature Granulocytes 0.05 0.00 - 0.07 K/uL    Comment: Performed at [facility], [address] [place], [place] [address]  Comprehensive metabolic panel     Status: Abnormal   Collection Time: Mar 26, 2019 11:17 AM  Result Value Ref Range   Sodium 138 135 - 145 mmol/L   Potassium 4.5 3.5 - 5.1 mmol/L   Chloride 95 (L) 98 - 111 mmol/L   CO2 27 22 - 32 mmol/L   Glucose, Bld 163 (H) 70 - 99 mg/dL   BUN 20 8 - 23 mg/dL   Creatinine, Ser 6.04 0.44 - 1.00 mg/dL   Calcium 54.0 8.9 - 98.1 mg/dL   Total Protein 7.5 6.5 - 8.1 g/dL   Albumin 4.0 3.5 - 5.0 g/dL   AST 25 15 - 41 U/L   ALT 25 0 - 44 U/L   Alkaline Phosphatase 67 38 - 126 U/L   Total Bilirubin 1.5 (H) 0.3 - 1.2 mg/dL   GFR calc non Af Amer >60 >60 mL/min   GFR calc Af Amer >60 >60 mL/min   Anion gap 16 (H) 5 - 15    Comment: Performed at [facility], [address] [place], [place] [address]  Troponin I - ONCE - STAT     Status: Abnormal   Collection Time: 03-26-2019 11:17 AM  Result Value Ref Range   Troponin I 0.03 (HH) <0.03 ng/mL    Comment: CRITICAL RESULT CALLED TO, READ BACK BY AND VERIFIED WITH: MINTER,R@1240  BY MATTHEWS, B March 26, 2019 Performed at [facility], [address], [place], [place] [address]   Lipase, blood     Status: None   Collection Time: 2019-03-26 11:17 AM  Result Value Ref Range   Lipase 46 11 - 51 U/L    Comment: Performed at [facility], [address], [place], [place] [address]  Urinalysis, Routine w reflex microscopic     Status: Abnormal   Collection Time: 03/26/2019 11:58 AM  Result Value Ref Range   Color, Urine AMBER (A) YELLOW    Comment: BIOCHEMICALS MAY BE AFFECTED BY COLOR   APPearance HAZY (A) CLEAR   Specific Gravity, Urine 1.027 1.005 - 1.030   pH 5.0 5.0 - 8.0   Glucose, UA NEGATIVE NEGATIVE mg/dL   Hgb urine dipstick NEGATIVE NEGATIVE    Bilirubin Urine NEGATIVE NEGATIVE   Ketones, ur 20 (A) NEGATIVE mg/dL   Protein, ur 657 (A) NEGATIVE mg/dL   Nitrite NEGATIVE NEGATIVE   Leukocytes,Ua NEGATIVE NEGATIVE   RBC / HPF 0-5 0 - 5 RBC/hpf   WBC, UA 0-5 0 - 5 WBC/hpf   Bacteria, UA NONE SEEN NONE SEEN   Squamous Epithelial / LPF 0-5 0 - 5   Mucus PRESENT    Hyaline Casts, UA PRESENT     Comment: Performed at [facility], [address] [place], [place] [address]  SARS Coronavirus 2 (CEPHEID - Performed in [facility] hospital lab), Hosp Order     Status: None   Collection Time: 03/26/2019  2:26 PM  Result Value Ref Range   SARS Coronavirus 2 NEGATIVE NEGATIVE    Comment: (NOTE) If result is NEGATIVE SARS-CoV-2 target nucleic acids are NOT DETECTED. The SARS-CoV-2 RNA is generally detectable in upper and lower  respiratory specimens during the acute phase of infection. The lowest  concentration of SARS-CoV-2 viral copies this assay can detect is 250  copies / mL. A negative  result does not preclude SARS-CoV-2 infection  and should not be used as the sole basis for treatment or other  patient management decisions.  A negative result may occur with  improper specimen collection / handling, submission of specimen other  than nasopharyngeal swab, presence of viral mutation(s) within the  areas targeted by this assay, and inadequate number of viral copies  (<250 copies / mL). A negative result must be combined with clinical  observations, patient history, and epidemiological information. If result is POSITIVE SARS-CoV-2 target nucleic acids are DETECTED. The SARS-CoV-2 RNA is generally detectable in upper and lower  respiratory specimens dur ing the acute phase of infection.  Positive  results are indicative of active infection with SARS-CoV-2.  Clinical  correlation with patient history and other diagnostic information is  necessary to determine patient infection status.  Positive results do  not rule out bacterial infection  or co-infection with other viruses. If result is PRESUMPTIVE POSTIVE SARS-CoV-2 nucleic acids MAY BE PRESENT.   A presumptive positive result was obtained on the submitted specimen  and confirmed on repeat testing.  While 2019 novel coronavirus  (SARS-CoV-2) nucleic acids may be present in the submitted sample  additional confirmatory testing may be necessary for epidemiological  and / or clinical management purposes  to differentiate between  SARS-CoV-2 and other Sarbecovirus currently known to infect humans.  If clinically indicated additional testing with an alternate test  methodology [phone]) is advised. The SARS-CoV-2 RNA is generally  detectable in upper and lower respiratory sp ecimens during the acute  phase of infection. The expected result is Negative. Fact Sheet for Patients:  BoilerBrush.com.cy Fact Sheet for Healthcare Providers: https://pope.com/ This test is not yet approved or cleared by the [place] FDA and has been authorized for detection and/or diagnosis of SARS-CoV-2 by FDA under an Emergency Use Authorization (EUA).  This EUA will remain in effect (meaning this test can be used) for the duration of the COVID-19 declaration under Section 564(b)(1) of the Act, 21 U.S.C. section 360bbb-3(b)(1), unless the authorization is terminated or revoked sooner. Performed at [facility], [address] [place], [place] [address]   Glucose, capillary     Status: Abnormal   Collection Time: 04/04/2019  7:19 PM  Result Value Ref Range   Glucose-Capillary 144 (H) 70 - 99 mg/dL  Blood gas, arterial     Status: Abnormal   Collection Time: 04/03/2019  7:35 PM  Result Value Ref Range   FIO2 100.00    pH, Arterial 7.452 (H) 7.350 - 7.450   pCO2 arterial 32.4 32.0 - 48.0 mmHg   pO2, Arterial 47.2 (L) 83.0 - 108.0 mmHg   Bicarbonate 23.7 20.0 - 28.0 mmol/L   Acid-base deficit 1.1 0.0 - 2.0 mmol/L   O2 Saturation 81.8 %   Patient  temperature 37.0    Allens test (pass/fail) PASS PASS    Comment: Performed at [facility], [address] [place], [place] [address]  MRSA PCR Screening     Status: None   Collection Time: 04/17/2019  7:54 PM  Result Value Ref Range   MRSA by PCR NEGATIVE NEGATIVE    Comment:        The GeneXpert MRSA Assay (FDA approved for NASAL specimens only), is one component of a comprehensive MRSA colonization surveillance program. It is not intended to diagnose MRSA infection nor to guide or monitor treatment for MRSA infections. Performed at [facility], [address], [place], [place] [address]   Culture, blood (  Routine X 2) w Reflex to ID Panel     Status: None (Preliminary result)   Collection Time: 04/13/2019  9:50 PM  Result Value Ref Range   Specimen Description LEFT ANTECUBITAL    Special Requests      BOTTLES DRAWN AEROBIC AND ANAEROBIC Blood Culture adequate volume   Culture      NO GROWTH < 12 HOURS Performed at [facility], [address] [place], [place] [address]    Report Status PENDING   Lactic acid, plasma     Status: Abnormal   Collection Time: 04-13-19  9:50 PM  Result Value Ref Range   Lactic Acid, Venous 4.4 (HH) 0.5 - 1.9 mmol/L    Comment: CRITICAL RESULT CALLED TO, READ BACK BY AND VERIFIED WITH: HARRIS,B @ 2359 ON 6/7/320 BY JUW Performed at [facility], [address], [place], [place] [address]   Basic metabolic panel     Status: Abnormal   Collection Time: 03/26/19  4:18 AM  Result Value Ref Range   Sodium 138 135 - 145 mmol/L   Potassium 4.0 3.5 - 5.1 mmol/L   Chloride 103 98 - 111 mmol/L   CO2 25 22 - 32 mmol/L   Glucose, Bld 200 (H) 70 - 99 mg/dL   BUN 29 (H) 8 - 23 mg/dL   Creatinine, Ser 2.95 (H) 0.44 - 1.00 mg/dL   Calcium 9.0 8.9 - 62.1 mg/dL   GFR calc non Af Amer 39 (L) >60 mL/min   GFR calc Af Amer 45 (L) >60 mL/min   Anion gap 10 5 - 15    Comment: Performed at [facility], [address] [place], [place] [address]  CBC     Status:  Abnormal   Collection Time: 03/26/19  4:18 AM  Result Value Ref Range   WBC 13.9 (H) 4.0 - 10.5 K/uL   RBC 4.05 3.87 - 5.11 MIL/uL   Hemoglobin 12.3 12.0 - 15.0 g/dL    Comment: REPEATED TO VERIFY DELTA CHECK NOTED    HCT 38.8 36.0 - 46.0 %   MCV 95.8 80.0 - 100.0 fL   MCH 30.4 26.0 - 34.0 pg   MCHC 31.7 30.0 - 36.0 g/dL   RDW 78.4 69.6 - 29.5 %   Platelets 237 150 - 400 K/uL   nRBC 0.0 0.0 - 0.2 %    Comment: Performed at [facility], [address] [place], [place] [address]  Lactic acid, plasma     Status: Abnormal   Collection Time: 03/26/19  4:18 AM  Result Value Ref Range   Lactic Acid, Venous 2.3 (HH) 0.5 - 1.9 mmol/L    Comment: CRITICAL RESULT CALLED TO, READ BACK BY AND VERIFIED WITH: AMBURN,A AT 5:05AM ON 03/26/19 BY [facility] Performed at [facility], [address], [place], [place] [address]   Brain natriuretic peptide     Status: Abnormal   Collection Time: 03/26/19  8:30 AM  Result Value Ref Range   B Natriuretic Peptide 950.0 (H) 0.0 - 100.0 pg/mL    Comment: Performed at [name], [address], [place], [place] [address]  Procalcitonin - Baseline     Status: None   Collection Time: 03/26/19  8:30 AM  Result Value Ref Range   Procalcitonin 5.86 ng/mL    Comment:        Interpretation: PCT > 2 ng/mL: Systemic infection (sepsis) is likely, unless other causes are known. (NOTE)       Sepsis PCT Algorithm  Lower Respiratory Tract                                      Infection PCT Algorithm    ----------------------------     ----------------------------         PCT < 0.25 ng/mL                PCT < 0.10 ng/mL         Strongly encourage             Strongly discourage   discontinuation of antibiotics    initiation of antibiotics    ----------------------------     -----------------------------       PCT 0.25 - 0.50 ng/mL            PCT 0.10 - 0.25 ng/mL               OR       >80% decrease in PCT            Discourage initiation of                                             antibiotics      Encourage discontinuation           of antibiotics    ----------------------------     -----------------------------         PCT >= 0.50 ng/mL              PCT 0.26 - 0.50 ng/mL               AND       <80% decrease in PCT              Encourage initiation of                                             antibiotics       Encourage continuation           of antibiotics    ----------------------------     -----------------------------        PCT >= 0.50 ng/mL                  PCT > 0.50 ng/mL               AND         increase in PCT                  Strongly encourage                                      initiation of antibiotics    Strongly encourage escalation           of antibiotics                                     -----------------------------  PCT <= 0.25 ng/mL                                                 OR                                        > 80% decrease in PCT                                     Discontinue / Do not initiate                                             antibiotics Performed at [facility], [address] [place], [place] [address]   Heparin level (unfractionated)     Status: Abnormal   Collection Time: 03/26/19  9:56 AM  Result Value Ref Range   Heparin Unfractionated 1.16 (H) 0.30 - 0.70 IU/mL    Comment: RESULTS CONFIRMED BY MANUAL DILUTION (NOTE) If heparin results are below expected values, and patient dosage has  been confirmed, suggest follow up testing of antithrombin III levels. Performed at [facility], [address] [place], [place] [address]   APTT     Status: None   Collection Time: 03/26/19  9:56 AM  Result Value Ref Range   aPTT 35 24 - 36 seconds    Comment: Performed at [facility], [address] [place], [place] [address]  Glucose, capillary     Status: Abnormal   Collection Time: 03/26/19 11:05 AM  Result Value Ref Range    Glucose-Capillary 129 (H) 70 - 99 mg/dL   Reviewed CT- dilated proximal bowel and taper to decompressed bowel centrally and decompressed bowel into the pelvis, stool in the colo; CXR with concern for PNA / opacities  Ct Abdomen Pelvis W Contrast  Result Date: 03/24/2019 CLINICAL DATA:  Constipation since Thursday. Vomiting. Hysterectomy. Appendectomy. High-grade bowel obstruction. EXAM: CT ABDOMEN AND PELVIS WITH CONTRAST TECHNIQUE: Multidetector CT imaging of the abdomen and pelvis was performed using the standard protocol following bolus administration of intravenous contrast. CONTRAST:  100mL OMNIPAQUE IOHEXOL 300 MG/ML  SOLN COMPARISON:  None. FINDINGS: Lower chest: Right base atelectasis. Left base collapse/consolidative change. Moderate cardiomegaly with small pericardial effusion. Small bilateral pleural effusions. A large hiatal hernia, with approximately 1/2 of the stomach positioned in the lower chest. Hepatobiliary: Anterior left hepatic lobe cyst. Cholecystectomy, without biliary ductal dilatation. Pancreas: Normal, without mass or ductal dilatation. Spleen: Normal in size, without focal abnormality. Adrenals/Urinary Tract: Normal adrenal glands. Bilateral renal low-density lesions which are likely cysts and minimally complex cysts. Other renal lesions are too small to characterize. No hydronephrosis. Normal urinary bladder. Stomach/Bowel: The stomach is distended and fluid-filled. The left side of the colon is relatively decompressed with scattered diverticula. Proximal and mid small bowel loops are dilated including at up to 2.9 cm. This continues to the level of a transition point within the central pelvis, likely in the proximal ileum on image 55/3 and coronal image 41. No obstructive mass identified. No complicating ischemia. Vascular/Lymphatic: Aortic and branch vessel atherosclerosis.  No abdominopelvic adenopathy. Reproductive: Hysterectomy.  No adnexal mass. Other: Mild pelvic floor laxity.   No free intraperitoneal air. Musculoskeletal: Lumbosacral spondylosis with moderate convex left lumbar spine curvature. IMPRESSION: 1. High-grade partial small-bowel obstruction, to the level of the mid small bowel. Most likely related to adhesions. No complicating ischemia. 2. Distended stomach with large hiatal hernia. The patient may benefit from nasogastric tube placement. 3. Small bilateral pleural effusions with left worse than right base airspace disease. On the right, likely atelectasis. On the left, infection or aspiration cannot be excluded. 4. Cardiomegaly with small pericardial effusion. 5.  Aortic Atherosclerosis (ICD10-I70.0). Electronically Signed   By: Jeronimo GreavesKyle  Talbot M.D.   On: 03/24/2019 14:02   Dg Chest Port 1 View  Result Date: 03/22/2019 CLINICAL DATA:  NG tube placement. EXAM: PORTABLE CHEST 1 VIEW COMPARISON:  04/08/2019 and CT 03/26/2019 FINDINGS: Patient is slightly rotated to the left. Nasogastric tube is coiled once over the stomach in patient's known large hiatal hernia and then proceeds inferiorly over the left upper quadrant and off the film as tip is not visualized. Non radiopaque side-port is over the stomach in the left upper abdomen. Exam demonstrates adequate lung volumes with worsening bilateral perihilar opacification suggesting interstitial edema and less likely infection. Persistent opacification over the left base likely effusion with atelectasis. Stable mild hazy density over the right base which may represent a small effusion with atelectasis. Infection over the lung bases is possible. Stable cardiomegaly. Remainder of the exam is unchanged. IMPRESSION: Worsening bilateral perihilar opacification suggesting worsening interstitial edema. Stable bibasilar opacification likely small effusions left greater than right with associated basilar atelectasis. Infection in the mid to lower lungs is possible. Stable cardiomegaly. Nasogastric tube coiled once over the large hiatal hernia  with side-port over the stomach in the left upper quadrant and tip not visualized. Electronically Signed   By: Elberta Fortisaniel  Boyle M.D.   On: 04/05/2019 20:28   Dg Chest Portable 1 View  Result Date: 04/04/2019 CLINICAL DATA:  Nasogastric tube placement EXAM: PORTABLE CHEST 1 VIEW COMPARISON:  Abdominal CT from earlier today FINDINGS: Nasogastric tube which at least reaches the stomach (which is low lying and distended by CT). There is haziness at both lung bases where there is atelectasis and pleural fluid by prior CT. Cardiopericardial enlargement. IMPRESSION: 1. The nasogastric tube reaches the stomach at least. 2. Cardiomegaly with bilateral pleural effusion obscuring the lower lobes. Electronically Signed   By: Marnee SpringJonathon  Watts M.D.   On: 04/17/2019 16:36     Assessment & Plan:  Carrie Morales is a 85 y.o. female with SBO that is also associated with PNA, potentially from aspiration, A fib with RVR likely secondary to dehydration and not absorbing her medications.  Her abdomen is distended but she has minimal to no tenderness.    -NG to low intermittent suction, NPO, can have 30cc of ice an hour for comfort -I am concerned that if she goes to surgery that she will not be able to be extubated and will also put more strain on her heart.  -I think her PNA is driving her sepsis and it is not related to her abdominal obstruction.  -Talk to Carrie CroakSon, Carrie Morales, [phone], about the bowel obstruction, about the management with NG, and about the risk of taking her to surgery due to her PNA, CHF/ A fib issues, and we discussed whether she would want an endotracheal tube and whether she would want to have CPR if her heart were to stop. They will  discuss once the son returns.   All questions were answered to the satisfaction of the patient and family.     Virl Cagey 03/26/2019, 12:21 PM

## 2019-03-26 NOTE — Progress Notes (Signed)
Oxygen down to 40 , patient appears better HR better controled.

## 2019-03-26 NOTE — Progress Notes (Signed)
PROGRESS NOTE  Carrie Morales ZOX:[id number] DOB: 02/03/1928 DOA: 04/15/2019 PCP: Alinda DeemJannach, Stephen, MD  Brief History:  85 year old female with a history of chronic atrial fibrillation on apixaban, diastolic CHF, hypertension, bowel obstruction status post bowel resection 30 years ago presenting with 1 week history of abdominal pain that worsened over the past 2 to 3 days to the point of resulting in nausea and vomiting.  The patient was also having constipation-like symptoms and tried some over-the-counter laxatives without relief.  Because of her progressive symptoms she presented for further evaluation.  CT of the abdomen and pelvis in the ED showed left basilar collapse less consolidative changes, small bilateral pleural effusions, left greater than right with a large hiatus hernia.  There was also dilatation of the proximal and mid small bowel loops up to 2.9 cm consistent with a high-grade SBO.  The patient was treated with bowel rest and IV fluids.  When she arrived to the medical floor, the patient developed atrial fibrillation with RVR and respiratory distress.  She was transferred to the stepdown unit and started on diltiazem drip and placed on BiPAP.  Subsequently, the patient became hypotensive requiring fluid resuscitation.  Repeat chest x-ray show worsening bilateral perihilar densities with stable bibasilar opacifications.  Lactic acid peaked at 4.4.  The patient was started on ceftriaxone and metronidazole.  Assessment/Plan: Sepsis -Present at the time of admission -Secondary to aspiration pneumonia/CAP -Lactic acid peaked at 4.4 -Judicious IV fluids -Check procalcitonin -Follow blood culture -Urinalysis negative for pyuria -Personally reviewed chest x-ray--worsening bilateral perihilar opacification  Acute respiratory failure with hypoxia -Secondary to pneumonia and pulmonary edema -Currently on BiPAP FiO2 40% -Wean BiPAP as tolerated -personally reviewed  CXR-small bilateral pleural effusions; increase interstitial markings  Small bowel obstruction -General surgery consult -NG placed to low intermittent suction -Concerned about efficacy of NG decompression given the patient's large hiatus hernia -Remain n.p.o. -Judicious IV fluids -Continue Protonix  Atrial fibrillation with RVR -Precipitated by her acute medical condition -Currently on diltiazem drip--continue -Echocardiogram -Patient was on apixaban prior to admission -Start IV heparin; holding apixaban until able to tolerate po -d/c digoxin for now  Acute diastolic CHF -The patient appears hypervolemic clinically with edema and JVD -Maintain negative fluid balance -Lasix 20 mg IV x1 for now and reassess -Accurate I's and O's  CKD 3 -Secondary to hemodynamic changes -Baseline creatinine~0.8-1.1 -monitor with diuresis  Hyperglycemia -check A1C   Disposition Plan:   Remain in SDU Family Communication:   Son updated at bedside 6/8  Consultants:  General surgery; cardiology  Code Status:  FULL  DVT Prophylaxis:  IV Heparin   Procedures: As Listed in Progress Note Above  Antibiotics: Ceftriaxone/flagyl 6/7>>6/8 Merrem 6/8>>>   The patient is critically ill with multiple organ systems failure and requires high complexity decision making for assessment and support, frequent evaluation and titration of therapies, application of advanced monitoring technologies and extensive interpretation of multiple databases.  Critical care time - 45 mins.     Subjective: Patient is awake and alert on BiPAP.  She denies any chest pain, shortness breath, vomiting, diarrhea.  She is not passing flatus.  Her abdominal pain is under control.  Objective: Vitals:   03/26/19 0530 03/26/19 0600 03/26/19 0630 03/26/19 0700  BP: (!) 99/49 (!) 97/51 (!) 101/53 (!) 111/50  Pulse: 73 (!) 156 (!) 50 (!) 38  Resp: (!) 23 (!) 29 (!) 29 (!) 29  Temp: 99.1 F (37.3 C) 99.1  F (37.3 C) 99.1  F (37.3 C) 99 F (37.2 C)  TempSrc:      SpO2: 100% 100% 100% 99%  Weight:      Height:        Intake/Output Summary (Last 24 hours) at 03/26/2019 0759 Last data filed at 03/26/2019 0500 Gross per 24 hour  Intake 1990.18 ml  Output 1350 ml  Net 640.18 ml   Weight change:  Exam:   General:  Pt is alert, follows commands appropriately, not in acute distress  HEENT: No icterus, No thrush, No neck mass, [place]/AT  Cardiovascular: IRRR, S1/S2, no rubs, no gallops  Respiratory: bibasilar crackles  Abdomen: Soft/+BS, non tender, non distended, no guarding  Extremities: No edema, No lymphangitis, No petechiae, No rashes, no synovitis   Data Reviewed: I have personally reviewed following labs and imaging studies Basic Metabolic Panel: Recent Labs  Lab 2019/02/17 1117 03/26/19 0418  NA 138 138  K 4.5 4.0  CL 95* 103  CO2 27 25  GLUCOSE 163* 200*  BUN 20 29*  CREATININE 0.84 1.23*  CALCIUM 10.3 9.0   Liver Function Tests: Recent Labs  Lab 2019/02/17 1117  AST 25  ALT 25  ALKPHOS 67  BILITOT 1.5*  PROT 7.5  ALBUMIN 4.0   Recent Labs  Lab 2019/02/17 1117  LIPASE 46   No results for input(s): AMMONIA in the last 168 hours. Coagulation Profile: No results for input(s): INR, PROTIME in the last 168 hours. CBC: Recent Labs  Lab 2019/02/17 1117 03/26/19 0418  WBC 12.6* 13.9*  NEUTROABS 11.2*  --   HGB 16.6* 12.3  HCT 53.1* 38.8  MCV 96.2 95.8  PLT 304 237   Cardiac Enzymes: Recent Labs  Lab 2019/02/17 1117  TROPONINI 0.03*   BNP: Invalid input(s): POCBNP CBG: Recent Labs  Lab 2019/02/17 1919  GLUCAP 144*   HbA1C: No results for input(s): HGBA1C in the last 72 hours. Urine analysis:    Component Value Date/Time   COLORURINE AMBER (A) 2020/02/219 1158   APPEARANCEUR HAZY (A) 2020/02/219 1158   LABSPEC 1.027 2020/02/219 1158   PHURINE 5.0 2020/02/219 1158   GLUCOSEU NEGATIVE 2020/02/219 1158   HGBUR NEGATIVE 2020/02/219 1158   BILIRUBINUR NEGATIVE 2020/02/219  1158   KETONESUR 20 (A) 2020/02/219 1158   PROTEINUR 100 (A) 2020/02/219 1158   UROBILINOGEN 0.2 07/24/2014 1534   NITRITE NEGATIVE 2020/02/219 1158   LEUKOCYTESUR NEGATIVE 2020/02/219 1158   Sepsis Labs: @LABRCNTIP (procalcitonin:4,lacticidven:4) ) Recent Results (from the past 240 hour(s))  SARS Coronavirus 2 (CEPHEID - Performed in [facility] lab), Hosp Order     Status: None   Collection Time: 2019/02/17  2:26 PM  Result Value Ref Range Status   SARS Coronavirus 2 NEGATIVE NEGATIVE Final    Comment: (NOTE) If result is NEGATIVE SARS-CoV-2 target nucleic acids are NOT DETECTED. The SARS-CoV-2 RNA is generally detectable in upper and lower  respiratory specimens during the acute phase of infection. The lowest  concentration of SARS-CoV-2 viral copies this assay can detect is 250  copies / mL. A negative result does not preclude SARS-CoV-2 infection  and should not be used as the sole basis for treatment or other  patient management decisions.  A negative result may occur with  improper specimen collection / handling, submission of specimen other  than nasopharyngeal swab, presence of viral mutation(s) within the  areas targeted by this assay, and inadequate number of viral copies  (<250 copies / mL). A negative result must be combined with  clinical  observations, patient history, and epidemiological information. If result is POSITIVE SARS-CoV-2 target nucleic acids are DETECTED. The SARS-CoV-2 RNA is generally detectable in upper and lower  respiratory specimens dur ing the acute phase of infection.  Positive  results are indicative of active infection with SARS-CoV-2.  Clinical  correlation with patient history and other diagnostic information is  necessary to determine patient infection status.  Positive results do  not rule out bacterial infection or co-infection with other viruses. If result is PRESUMPTIVE POSTIVE SARS-CoV-2 nucleic acids MAY BE PRESENT.   A  presumptive positive result was obtained on the submitted specimen  and confirmed on repeat testing.  While 2019 novel coronavirus  (SARS-CoV-2) nucleic acids may be present in the submitted sample  additional confirmatory testing may be necessary for epidemiological  and / or clinical management purposes  to differentiate between  SARS-CoV-2 and other Sarbecovirus currently known to infect humans.  If clinically indicated additional testing with an alternate test  methodology [phone] is advised. The SARS-CoV-2 RNA is generally  detectable in upper and lower respiratory sp ecimens during the acute  phase of infection. The expected result is Negative. Fact Sheet for Patients:  BoilerBrush.com.cyhttps://www.fda.gov/media/136312/download Fact Sheet for Healthcare Providers: https://pope.com/https://www.fda.gov/media/136313/download This test is not yet approved or cleared by the [place] FDA and has been authorized for detection and/or diagnosis of SARS-CoV-2 by FDA under an Emergency Use Authorization (EUA).  This EUA will remain in effect (meaning this test can be used) for the duration of the COVID-19 declaration under Section 564(b)(1) of the Act, 21 U.S.C. section 360bbb-3(b)(1), unless the authorization is terminated or revoked sooner. Performed at [facility], [address] [place], [place] [address]   MRSA PCR Screening     Status: None   Collection Time: 2019/09/21  7:54 PM  Result Value Ref Range Status   MRSA by PCR NEGATIVE NEGATIVE Final    Comment:        The GeneXpert MRSA Assay (FDA approved for NASAL specimens only), is one component of a comprehensive MRSA colonization surveillance program. It is not intended to diagnose MRSA infection nor to guide or monitor treatment for MRSA infections. Performed at [facility], [address] [place], [place] [address]   Culture, blood (Routine X 2) w Reflex to ID Panel     Status: None (Preliminary result)   Collection Time: 2019/09/21  9:50 PM    Result Value Ref Range Status   Specimen Description LEFT ANTECUBITAL  Final   Special Requests   Final    BOTTLES DRAWN AEROBIC AND ANAEROBIC Blood Culture adequate volume Performed at [facility], [address] [place], [place] [address]    Culture PENDING  Incomplete   Report Status PENDING  Incomplete     Scheduled Meds:  chlorhexidine  15 mL Mouth Rinse BID   Chlorhexidine Gluconate Cloth  6 each Topical Daily   digoxin  0.125 mg Intravenous Daily   enoxaparin (LOVENOX) injection  50 mg Subcutaneous Q12H   mouth rinse  15 mL Mouth Rinse q12n4p   pantoprazole (PROTONIX) IV  40 mg Intravenous Q24H   sodium chloride flush  3 mL Intravenous Q12H   Continuous Infusions:  sodium chloride     cefTRIAXone (ROCEPHIN)  IV 2 g (2019/09/21 2245)   dextrose 5 % and 0.45 % NaCl with KCl 20 mEq/L 100 mL/hr at 03/26/19 0757   diltiazem (CARDIZEM) infusion 5 mg/hr (03/26/19 [id number])   metronidazole 500 mg (03/26/19 0757)  Procedures/Studies: Ct Abdomen Pelvis W Contrast  Result Date: 04/16/2019 CLINICAL DATA:  Constipation since Thursday. Vomiting. Hysterectomy. Appendectomy. High-grade bowel obstruction. EXAM: CT ABDOMEN AND PELVIS WITH CONTRAST TECHNIQUE: Multidetector CT imaging of the abdomen and pelvis was performed using the standard protocol following bolus administration of intravenous contrast. CONTRAST:  OMNIPAQUE IOHEXOL 300 MG/ML  SOLN COMPARISON:  None. FINDINGS: Lower chest: Right base atelectasis. Left base collapse/consolidative change. Moderate cardiomegaly with small pericardial effusion. Small bilateral pleural effusions. A large hiatal hernia, with approximately 1/2 of the stomach positioned in the lower chest. Hepatobiliary: Anterior left hepatic lobe cyst. Cholecystectomy, without biliary ductal dilatation. Pancreas: Normal, without mass or ductal dilatation. Spleen: Normal in size, without focal abnormality. Adrenals/Urinary Tract: Normal adrenal glands.  Bilateral renal low-density lesions which are likely cysts and minimally complex cysts. Other renal lesions are too small to characterize. No hydronephrosis. Normal urinary bladder. Stomach/Bowel: The stomach is distended and fluid-filled. The left side of the colon is relatively decompressed with scattered diverticula. Proximal and mid small bowel loops are dilated including at up to 2.9 cm. This continues to the level of a transition point within the central pelvis, likely in the proximal ileum on image 55/3 and coronal image 41. No obstructive mass identified. No complicating ischemia. Vascular/Lymphatic: Aortic and branch vessel atherosclerosis. No abdominopelvic adenopathy. Reproductive: Hysterectomy.  No adnexal mass. Other: Mild pelvic floor laxity.  No free intraperitoneal air. Musculoskeletal: Lumbosacral spondylosis with moderate convex left lumbar spine curvature. IMPRESSION: 1. High-grade partial small-bowel obstruction, to the level of the mid small bowel. Most likely related to adhesions. No complicating ischemia. 2. Distended stomach with large hiatal hernia. The patient may benefit from nasogastric tube placement. 3. Small bilateral pleural effusions with left worse than right base airspace disease. On the right, likely atelectasis. On the left, infection or aspiration cannot be excluded. 4. Cardiomegaly with small pericardial effusion. 5.  Aortic Atherosclerosis (ICD10-I70.0). Electronically Signed   By: Jeronimo Greaves M.D.   On: 03/20/2019 14:02   Dg Chest Port 1 View  Result Date: 03/27/2019 CLINICAL DATA:  NG tube placement. EXAM: PORTABLE CHEST 1 VIEW COMPARISON:  04/08/2019 and CT 03/29/2019 FINDINGS: Patient is slightly rotated to the left. Nasogastric tube is coiled once over the stomach in patient's known large hiatal hernia and then proceeds inferiorly over the left upper quadrant and off the film as tip is not visualized. Non radiopaque side-port is over the stomach in the left upper  abdomen. Exam demonstrates adequate lung volumes with worsening bilateral perihilar opacification suggesting interstitial edema and less likely infection. Persistent opacification over the left base likely effusion with atelectasis. Stable mild hazy density over the right base which may represent a small effusion with atelectasis. Infection over the lung bases is possible. Stable cardiomegaly. Remainder of the exam is unchanged. IMPRESSION: Worsening bilateral perihilar opacification suggesting worsening interstitial edema. Stable bibasilar opacification likely small effusions left greater than right with associated basilar atelectasis. Infection in the mid to lower lungs is possible. Stable cardiomegaly. Nasogastric tube coiled once over the large hiatal hernia with side-port over the stomach in the left upper quadrant and tip not visualized. Electronically Signed   By: Elberta Fortis M.D.   On: 03/21/2019 20:28   Dg Chest Portable 1 View  Result Date: 03/24/2019 CLINICAL DATA:  Nasogastric tube placement EXAM: PORTABLE CHEST 1 VIEW COMPARISON:  Abdominal CT from earlier today FINDINGS: Nasogastric tube which at least reaches the stomach (which is low lying and distended by CT). There is  haziness at both lung bases where there is atelectasis and pleural fluid by prior CT. Cardiopericardial enlargement. IMPRESSION: 1. The nasogastric tube reaches the stomach at least. 2. Cardiomegaly with bilateral pleural effusion obscuring the lower lobes. Electronically Signed   By: Monte Fantasia M.D.   On: 03/31/2019 16:36    Orson Eva, DO  [facility] Pager [phone]  If 7PM-7AM, please contact night-coverage [email or web address] Password [email or web address] 03/26/2019, 7:59 AM   LOS: 1 day

## 2019-03-26 NOTE — Progress Notes (Addendum)
Needs double lumen PICC per Dr. Carles Collet. RN aware of risk in placing PICC and BC are less than 12 hours with no growth. Dr. Carles Collet at bedside and wants PICC placed, benefit of PICC out weighs risk of infection at this time. Will continue to monitor.

## 2019-03-26 NOTE — Consult Note (Addendum)
Cardiology Consultation:   Patient ID: Dedra Matsuo MRN: [id number]; DOB: 04-Aug-1928  Admit date: 04/13/2019 Date of Consult: 03/26/2019  Primary Care Provider: Alinda Deem, MD Primary Cardiologist: Laural Golden, MD [facility])    Patient Profile:   Kylyn Mcdade is an 85 y.o. female with a history of permanent atrial fibrillation diagnosed in Dec 29, 2019and managed by Dr. Kathryne Sharper with combination of apixaban, Lanoxin, and Lopressor who is being seen today for the evaluation of atrial fibrillation with RVR at the request of Dr. Arbutus Leas.  History of Present Illness:   Ms. Pinkham is an 85 yr old female patient who lives alone and is very active based on discussion with her son at bedside. She took care of her husband who died in Oct 15, 2018. She was diagnosed with persistent atrial fibrillation and managed with strategy of heart rate control and anticoagulation by her cardiologist in [place].  Patient's son indicates that it did take some medication adjustments including addition of a diuretic to control her shortness of breath, but ultimately she was doing well until recent symptoms intervened.  Spearing seeing abdominal pain approximately 1 week ago, worse in the last few days and associated with recurring nausea and emesis, inability to take PO's.  She presented to [facility] for evaluation and underwent CT imaging demonstrating left basilar collapse versus consolidative changes, small bilateral pleural effusions and a large hiatal hernia.  She also had dilatation of the proximal and mid small bowel loops consistent with high-grade SBO.  She was placed on bowel rest and admitted, subsequently developing rapid atrial fibrillation and respiratory distress resulting in transfer to the ICU.  At the present time she is on intravenous diltiazem with heart rate control in the 80s and has been placed on BiPAP, comfortable after Ativan.  Surgical consultation is pending.  She is  being treated in addition with broad-spectrum antibiotics for possible CAP by primary team, lactic acid elevated at presentation and blood cultures pending.  At this time records not available regarding her outpatient cardiac work-up in the last 6 months.  Past Medical History:  Diagnosis Date   Colonic diverticular abscess    Essential hypertension    Osteopenia    10-15-2014 tab T score -1.1   Permanent atrial fibrillation    Diagnosed 2018/10/15- Dr. Kathryne Sharper    Past Surgical History:  Procedure Laterality Date   ABDOMINAL HYSTERECTOMY  1971   APPENDECTOMY  1958   COLON RESECTION  1991   ECTOPIC PREGNANCY SURGERY  1958   LSO   OVARIAN CYST SURGERY  1972   right   REPLACEMENT TOTAL KNEE     X 2     Home Medications:  Prior to Admission medications   Medication Sig Start Date End Date Taking? Authorizing Provider  apixaban (ELIQUIS) 2.5 MG TABS tablet Take 2.5 mg by mouth 2 (two) times daily.    Yes [provider]  digoxin (LANOXIN) 0.125 MG tablet Take 0.125 mg by mouth daily.   Yes [provider]  furosemide (LASIX) 20 MG tablet Take 20 mg by mouth See admin instructions. Take one tablet by mouth 5 days per week   Yes [provider]  losartan (COZAAR) 50 MG tablet Take 50 mg by mouth 2 (two) times a day.   Yes [provider]  metoprolol tartrate (LOPRESSOR) 100 MG tablet Take 100 mg by mouth 2 (two) times daily.   Yes [provider]  ondansetron (ZOFRAN) 4 MG tablet Take 4 mg by  mouth daily as needed for nausea or vomiting.   Yes [provider]  potassium chloride (K-DUR) 10 MEQ tablet Take 10 mEq by mouth See admin instructions. Take one tablet by mouth 5 days per week   Yes [provider]  aspirin 81 MG tablet Take 81 mg by mouth daily.    [provider]  Calcium Carbonate-Vitamin D (CALCIUM + D PO) Take 1 tablet by mouth daily.     [provider]  Cholecalciferol (VITAMIN D  PO) Take 1 capsule by mouth daily.     [provider]  Multiple Vitamin (MULTIVITAMIN) tablet Take 1 tablet by mouth daily.    [provider]    Inpatient Medications: Scheduled Meds:  chlorhexidine  15 mL Mouth Rinse BID   Chlorhexidine Gluconate Cloth  6 each Topical Daily   insulin aspart  0-9 Units Subcutaneous Q4H   mouth rinse  15 mL Mouth Rinse q12n4p   pantoprazole (PROTONIX) IV  40 mg Intravenous Q24H   sodium chloride flush  3 mL Intravenous Q12H   Continuous Infusions:  sodium chloride 250 mL (03/26/19 0934)   diltiazem (CARDIZEM) infusion 5 mg/hr (03/26/19 0642)   heparin     meropenem (MERREM) IV 1,000 mg (03/26/19 0949)   PRN Meds: sodium chloride, acetaminophen **OR** acetaminophen, albuterol, ondansetron **OR** ondansetron (ZOFRAN) IV, polyethylene glycol, sodium chloride flush, traZODone  Allergies:    Allergies  Allergen Reactions   Codeine    Penicillins    Sulfa Antibiotics     Social History:   Social History   Socioeconomic History   Marital status: Married    Spouse name: Not on file   Number of children: Not on file   Years of education: Not on file   Highest education level: Not on file  Occupational History   Not on file  Social Needs   Financial resource strain: Not on file   Food insecurity:    Worry: Not on file    Inability: Not on file   Transportation needs:    Medical: Not on file    Non-medical: Not on file  Tobacco Use   Smoking status: Former Smoker   Smokeless tobacco: Never Used  Substance and Sexual Activity   Alcohol use: Not Currently    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[employer]   Drug use: Not on file   Sexual activity: Never  Lifestyle   Physical activity:    Days per week: Not on file    Minutes per session: Not on file   Stress: Not on file  Relationships   Social connections:    Talks on phone: Not on file    Gets together: Not on file    Attends religious service: Not on file     Active member of club or organization: Not on file    Attends meetings of clubs or organizations: Not on file    Relationship status: Not on file   Intimate partner violence:    Fear of current or ex partner: Not on file    Emotionally abused: Not on file    Physically abused: Not on file    Forced sexual activity: Not on file  Other Topics Concern   Not on file  Social History Narrative   Not on file    Family History:     Family History  Problem Relation Age of Onset   Cancer Brother        Lung   Cancer Brother  Bladder     ROS:  Please see the history of present illness.  Review of Systems  Reason unable to perform ROS: patient sedated with ativan. all history from son at bedside.   All other ROS reviewed and negative.     Physical Exam/Data:   Vitals:   03/26/19 0800 03/26/19 0830 03/26/19 0839 03/26/19 0842  BP: (!) 104/51 (!) 102/56    Pulse: 86 64 (!) 105   Resp: (!) 28 (!) 27 (!) 28   Temp: 99 F (37.2 C) 99 F (37.2 C)    TempSrc:      SpO2: 99% 100% 98% 99%  Weight:      Height:        Intake/Output Summary (Last 24 hours) at 03/26/2019 1008 Last data filed at 03/26/2019 0938 Gross per 24 hour  Intake 1996.18 ml  Output 1350 ml  Net 646.18 ml   Last 3 Weights 03/26/2019 03/24/2019 04/09/2019  Weight (lbs) 106 lb 7.7 oz 105 lb 115 lb  Weight (kg) 48.3 kg 47.628 kg 52.164 kg     Body mass index is 18.28 kg/m.  General:  Thin, elderly, in no acute distress.  Wearing BiPAP mask. HEENT: normal Lymph: no adenopathy Neck: no JVD Endocrine:  No thryomegaly Vascular: No carotid bruits; FA pulses 2+ bilaterally without bruits  Cardiac: Irregularly irregular, 3/6 systolic murmur apex, 2/6 systolic murmur LSB Lungs:    decreased breath sounds laterally Abd: soft, nontender, no hepatomegaly  Ext: no edema Musculoskeletal:  No deformities, BUE and BLE strength normal and equal Skin: warm and dry  Neuro:  sedated Psych:  sedated  EKG:  The EKG  was personally reviewed and demonstrates: Afib with poor R wave progression anteriorly Telemetry:  Telemetry was personally reviewed and demonstrates: Afib at 80/m-was fast yesterday before transfer to ICU and on admission  Relevant CV Studies: Echocardiogram pending  Laboratory Data:  Chemistry Recent Labs  Lab 07-09-19 1117 03/26/19 0418  NA 138 138  K 4.5 4.0  CL 95* 103  CO2 27 25  GLUCOSE 163* 200*  BUN 20 29*  CREATININE 0.84 1.23*  CALCIUM 10.3 9.0  GFRNONAA >60 39*  GFRAA >60 45*  ANIONGAP 16* 10    Recent Labs  Lab 07-09-19 1117  PROT 7.5  ALBUMIN 4.0  AST 25  ALT 25  ALKPHOS 67  BILITOT 1.5*   Hematology Recent Labs  Lab 07-09-19 1117 03/26/19 0418  WBC 12.6* 13.9*  RBC 5.52* 4.05  HGB 16.6* 12.3  HCT 53.1* 38.8  MCV 96.2 95.8  MCH 30.1 30.4  MCHC 31.3 31.7  RDW 13.4 13.6  PLT 304 237   Cardiac Enzymes Recent Labs  Lab 07-09-19 1117  TROPONINI 0.03*   No results for input(s): TROPIPOC in the last 168 hours.  BNP Recent Labs  Lab 03/26/19 0830  BNP 950.0*    DDimer No results for input(s): DDIMER in the last 168 hours.  Radiology/Studies:  Ct Abdomen Pelvis W Contrast  Result Date: 04/03/2019 CLINICAL DATA:  Constipation since Thursday. Vomiting. Hysterectomy. Appendectomy. High-grade bowel obstruction. EXAM: CT ABDOMEN AND PELVIS WITH CONTRAST TECHNIQUE: Multidetector CT imaging of the abdomen and pelvis was performed using the standard protocol following bolus administration of intravenous contrast. CONTRAST:  100mL OMNIPAQUE IOHEXOL 300 MG/ML  SOLN COMPARISON:  None. FINDINGS: Lower chest: Right base atelectasis. Left base collapse/consolidative change. Moderate cardiomegaly with small pericardial effusion. Small bilateral pleural effusions. A large hiatal hernia, with approximately 1/2 of the stomach positioned in the  lower chest. Hepatobiliary: Anterior left hepatic lobe cyst. Cholecystectomy, without biliary ductal dilatation. Pancreas:  Normal, without mass or ductal dilatation. Spleen: Normal in size, without focal abnormality. Adrenals/Urinary Tract: Normal adrenal glands. Bilateral renal low-density lesions which are likely cysts and minimally complex cysts. Other renal lesions are too small to characterize. No hydronephrosis. Normal urinary bladder. Stomach/Bowel: The stomach is distended and fluid-filled. The left side of the colon is relatively decompressed with scattered diverticula. Proximal and mid small bowel loops are dilated including at up to 2.9 cm. This continues to the level of a transition point within the central pelvis, likely in the proximal ileum on image 55/3 and coronal image 41. No obstructive mass identified. No complicating ischemia. Vascular/Lymphatic: Aortic and branch vessel atherosclerosis. No abdominopelvic adenopathy. Reproductive: Hysterectomy.  No adnexal mass. Other: Mild pelvic floor laxity.  No free intraperitoneal air. Musculoskeletal: Lumbosacral spondylosis with moderate convex left lumbar spine curvature. IMPRESSION: 1. High-grade partial small-bowel obstruction, to the level of the mid small bowel. Most likely related to adhesions. No complicating ischemia. 2. Distended stomach with large hiatal hernia. The patient may benefit from nasogastric tube placement. 3. Small bilateral pleural effusions with left worse than right base airspace disease. On the right, likely atelectasis. On the left, infection or aspiration cannot be excluded. 4. Cardiomegaly with small pericardial effusion. 5.  Aortic Atherosclerosis (ICD10-I70.0). Electronically Signed   By: Abigail Miyamoto M.D.   On: Apr 16, 2019 14:02   Dg Chest Port 1 View  Result Date: Apr 16, 2019 CLINICAL DATA:  NG tube placement. EXAM: PORTABLE CHEST 1 VIEW COMPARISON:  04/16/19 and CT 04-16-2019 FINDINGS: Patient is slightly rotated to the left. Nasogastric tube is coiled once over the stomach in patient's known large hiatal hernia and then proceeds  inferiorly over the left upper quadrant and off the film as tip is not visualized. Non radiopaque side-port is over the stomach in the left upper abdomen. Exam demonstrates adequate lung volumes with worsening bilateral perihilar opacification suggesting interstitial edema and less likely infection. Persistent opacification over the left base likely effusion with atelectasis. Stable mild hazy density over the right base which may represent a small effusion with atelectasis. Infection over the lung bases is possible. Stable cardiomegaly. Remainder of the exam is unchanged. IMPRESSION: Worsening bilateral perihilar opacification suggesting worsening interstitial edema. Stable bibasilar opacification likely small effusions left greater than right with associated basilar atelectasis. Infection in the mid to lower lungs is possible. Stable cardiomegaly. Nasogastric tube coiled once over the large hiatal hernia with side-port over the stomach in the left upper quadrant and tip not visualized. Electronically Signed   By: Marin Olp M.D.   On: 04-16-2019 20:28   Dg Chest Portable 1 View  Result Date: 04-16-2019 CLINICAL DATA:  Nasogastric tube placement EXAM: PORTABLE CHEST 1 VIEW COMPARISON:  Abdominal CT from earlier today FINDINGS: Nasogastric tube which at least reaches the stomach (which is low lying and distended by CT). There is haziness at both lung bases where there is atelectasis and pleural fluid by prior CT. Cardiopericardial enlargement. IMPRESSION: 1. The nasogastric tube reaches the stomach at least. 2. Cardiomegaly with bilateral pleural effusion obscuring the lower lobes. Electronically Signed   By: Monte Fantasia M.D.   On: 04/16/19 16:36    Assessment and Plan:   1. Chronic atrial fib with RVR in setting of pneumonia/sepsis/SBO now controlled on IV diltiazem, digoxin, lovenox. Was on eliquis PTA. 2. SBO 3. Acute hypoxic respiratory failure with hypoxiasecondary to CAP/CHF on  Bipap 4.  Sepsis 5. Presumed diastolic CHF-echo pending 6. CKD stage 3 Crt 1.73 today 7. Essential HTN on Cozaar 50 mg PTA     For questions or updates, please contact [facility] Please consult [email or web address] for contact info under    Signed, Jacolyn ReedyMichele Lenze, PA-C 03/26/2019 10:08 AM    Attending note:  Patient seen and examined.  Case discussed with the patient's son at bedside and also reviewed with Ms. Geni BersLenze PA-C.  Ms. Jac Can[place] presents with small bowel obstruction, onset of abdominal pain 1 week ago and more recently nausea and emesis with inability to tolerate PO's.  Reported cardiac history includes atrial fibrillation diagnosed in December 2019 and managed with heart rate control strategy and anticoagulation per her cardiologist in [place].  She may also have valvular heart disease based on discussion with the patient's son and also on examination today. She has apparently been doing well until the recent onset of abdominal symptoms.  At the present time she appears comfortable having received Ativan and now on BiPAP.  She had atrial fibrillation/flutter with RVR resulting in respiratory distress prior to this, current telemetry shows rate controlled atrial fibrillation on intravenous diltiazem at 5 mg/h.  Rate in the 80s, systolic blood pressure 100-110.  Oxygen saturation 98%.  Lungs are clear anteriorly.  Cardiac exam reveals irregularly irregular rhythm with 3/6 systolic murmur heard at the base and also toward the apex suggesting aortic stenosis plus minus mitral valve disease.  No peripheral edema at this time.  Lab work shows potassium 4.0, BUN 29, creatinine 1.23, BNP 950.3, platelets 237, SARS coronavirus 2 negative.  I reviewed her ECG from 04/09/2019 which showed rapid atrial fibrillation/flutter with leftward axis, decreased anterior R wave progression, and diffuse nonspecific ST-T changes.  Permanent atrial fibrillation based on management strategy since December 2019 by  cardiologist in [place].  Recent RVR complicating the picture in the setting of physiologic demands with SBO, possible pneumonia/sepsis, and respiratory distress.  She appears more comfortable now on intravenous diltiazem and BiPAP with heart rate controlled and atrial fibrillation present by telemetry.  Eliquis is currently on hold.  Follow-up echocardiogram is being obtained to assess cardiac structure and function as well as degree of valvular disease.  Surgical consultation is pending.  Jonelle SidleSamuel G. Shlomie Romig, M.D., F.A.C.C.

## 2019-03-27 ENCOUNTER — Inpatient Hospital Stay (HOSPITAL_COMMUNITY): Payer: Medicare Other

## 2019-03-27 DIAGNOSIS — J9601 Acute respiratory failure with hypoxia: Secondary | ICD-10-CM

## 2019-03-27 DIAGNOSIS — J181 Lobar pneumonia, unspecified organism: Secondary | ICD-10-CM

## 2019-03-27 DIAGNOSIS — R778 Other specified abnormalities of plasma proteins: Secondary | ICD-10-CM

## 2019-03-27 LAB — COMPREHENSIVE METABOLIC PANEL
ALT: 17 U/L (ref 0–44)
AST: 18 U/L (ref 15–41)
Albumin: 2.5 g/dL — ABNORMAL LOW (ref 3.5–5.0)
Alkaline Phosphatase: 40 U/L (ref 38–126)
Anion gap: 8 (ref 5–15)
BUN: 38 mg/dL — ABNORMAL HIGH (ref 8–23)
CO2: 26 mmol/L (ref 22–32)
Calcium: 9.2 mg/dL (ref 8.9–10.3)
Chloride: 106 mmol/L (ref 98–111)
Creatinine, Ser: 1.32 mg/dL — ABNORMAL HIGH (ref 0.44–1.00)
GFR calc Af Amer: 42 mL/min — ABNORMAL LOW (ref >60)
GFR calc non Af Amer: 36 mL/min — ABNORMAL LOW (ref >60)
Glucose, Bld: 89 mg/dL (ref 70–99)
Potassium: 4.2 mmol/L (ref 3.5–5.1)
Sodium: 140 mmol/L (ref 135–145)
Total Bilirubin: 1 mg/dL (ref 0.3–1.2)
Total Protein: 5.3 g/dL — ABNORMAL LOW (ref 6.5–8.1)

## 2019-03-27 LAB — PHOSPHORUS: Phosphorus: 3.1 mg/dL (ref 2.5–4.6)

## 2019-03-27 LAB — HEPARIN LEVEL (UNFRACTIONATED): Heparin Unfractionated: 0.7 IU/mL (ref 0.30–0.70)

## 2019-03-27 LAB — CBC
HCT: 35.9 % — ABNORMAL LOW (ref 36.0–46.0)
Hemoglobin: 11.4 g/dL — ABNORMAL LOW (ref 12.0–15.0)
MCH: 30.2 pg (ref 26.0–34.0)
MCHC: 31.8 g/dL (ref 30.0–36.0)
MCV: 95.2 fL (ref 80.0–100.0)
Platelets: 191 10*3/uL (ref 150–400)
RBC: 3.77 MIL/uL — ABNORMAL LOW (ref 3.87–5.11)
RDW: 14.1 % (ref 11.5–15.5)
WBC: 15.5 10*3/uL — ABNORMAL HIGH (ref 4.0–10.5)
nRBC: 0 % (ref 0.0–0.2)

## 2019-03-27 LAB — MAGNESIUM: Magnesium: 2 mg/dL (ref 1.7–2.4)

## 2019-03-27 LAB — GLUCOSE, CAPILLARY
Glucose-Capillary: 75 mg/dL (ref 70–99)
Glucose-Capillary: 75 mg/dL (ref 70–99)
Glucose-Capillary: 93 mg/dL (ref 70–99)

## 2019-03-27 LAB — APTT: aPTT: 45 seconds — ABNORMAL HIGH (ref 24–36)

## 2019-03-27 MED ORDER — KCL-LACTATED RINGERS-D5W 20 MEQ/L IV SOLN
INTRAVENOUS | Status: DC
  Filled 2019-03-27 (×2): qty 1000

## 2019-03-27 MED ORDER — PHENOL 1.4 % MT LIQD
1.0000 | OROMUCOSAL | Status: DC | PRN
  Administered 2019-03-27: 10:00:00 1 via OROMUCOSAL
  Filled 2019-03-27: qty 177

## 2019-03-27 MED ORDER — SODIUM CHLORIDE 0.9 % IV SOLN
500.0000 mg | INTRAVENOUS | Status: DC
  Administered 2019-03-27 – 2019-03-30 (×4): 500 mg via INTRAVENOUS
  Filled 2019-03-27 (×4): qty 500

## 2019-03-27 MED ORDER — KCL-LACTATED RINGERS-D5W 20 MEQ/L IV SOLN
INTRAVENOUS | Status: DC
  Administered 2019-03-27 – 2019-03-29 (×5): via INTRAVENOUS
  Filled 2019-03-27 (×11): qty 1000

## 2019-03-27 NOTE — Progress Notes (Signed)
PROGRESS NOTE  Carrie Morales VHQ:[id number] DOB: Oct 28, 1927 DOA: 04/07/2019 PCP: Alinda Deem, MD  Brief History:  85 year old female with a history of chronic atrial fibrillation on apixaban, diastolic CHF, hypertension, bowel obstruction status post bowel resection 30 years ago presenting with 1 week history of abdominal pain that worsened over the past 2 to 3 days to the point of resulting in nausea and vomiting.  The patient was also having constipation-like symptoms and tried some over-the-counter laxatives without relief.  Because of her progressive symptoms she presented for further evaluation.  CT of the abdomen and pelvis in the ED showed left basilar collapse less consolidative changes, small bilateral pleural effusions, left greater than right with a large hiatus hernia.  There was also dilatation of the proximal and mid small bowel loops up to 2.9 cm consistent with a high-grade SBO.  The patient was treated with bowel rest and IV fluids.  When she arrived to the medical floor, the patient developed atrial fibrillation with RVR and respiratory distress.  She was transferred to the stepdown unit and started on diltiazem drip and placed on BiPAP.  Subsequently, the patient became hypotensive requiring fluid resuscitation.  Repeat chest x-ray show worsening bilateral perihilar densities with stable bibasilar opacifications.  Lactic acid peaked at 4.4.  The patient was started on ceftriaxone and metronidazole.  Assessment/Plan: Sepsis -Present at the time of admission -Secondary to aspiration pneumonia/CAP -Lactic acid peaked at 4.4 -Judicious IV fluids -Check procalcitonin  5.86 -Follow blood culture--neg -Urinalysis negative for pyuria  Aspiration Pneumonia/Lobar pneumonia -6/9 Personally reviewed chest x-ray--worsening bilateral perihilar opacification -CT chest -add azithro  Acute respiratory failure with hypoxia -Secondary to pneumonia  -Still  intermittently requiring BiPAP -6/9--personally reviewed CXR-bilateral infiltrates  Small bowel obstruction -General surgery consult appreciated -6/9--case discussed with Dr. Henreitta Leber -NG placed to low intermittent suction -Conintue NG decompression  -Remain n.p.o. -Judicious IV fluids -Continue Protonix  Atrial fibrillation with RVR -Precipitated by her acute medical condition -Currently on diltiazem drip--continue -Echocardiogram--EF >65%, no WMA, mod MR -Patient was on apixaban prior to admission -Continue IV heparin; holding apixaban until able to tolerate po -d/c digoxin for now -appreciate cardiology consult  Chronic diastolic CHF -daily weights -Accurate I's and O's  CKD 3 -Secondary to hemodynamic changes -Baseline creatinine~0.8-1.1 -monitor with diuresis  Hyperglycemia -check A1C--5.3   Disposition Plan:   Remain in SDU Family Communication:   Son updated at bedside 6/9  Consultants:  General surgery; cardiology  Code Status:  FULL  DVT Prophylaxis:  IV Heparin   Procedures: As Listed in Progress Note Above  Antibiotics: Ceftriaxone/flagyl 6/7>>6/8 Merrem 6/8>>>   The patient is critically ill with multiple organ systems failure and requires high complexity decision making for assessment and support, frequent evaluation and titration of therapies, application of advanced monitoring technologies and extensive interpretation of multiple databases.  Critical care time - 35 mins.   Subjective: Pt breathing a little better, but remains dyspneic.  No n/v/d.  No flatus or BM.  Denies f/c, headache, cp, abd pain.  No dysuria or hematuria  Objective: Vitals:   03/27/19 0730 03/27/19 0744 03/27/19 0800 03/27/19 0830  BP: (!) 102/56  (!) 104/47 (!) 106/50  Pulse: 98  91 86  Resp: (!) 28  (!) 39 (!) 38  Temp:  99.1 F (37.3 C)    TempSrc:  Oral    SpO2: 96%  95% 92%  Weight:      Height:  Intake/Output Summary (Last 24 hours)  at 03/27/2019 1038 Last data filed at 03/27/2019 0937 Gross per 24 hour  Intake 690.87 ml  Output 875 ml  Net -184.13 ml   Weight change: -3.164 kg Exam:   General:  Pt is alert, follows commands appropriately, not in acute distress  HEENT: No icterus, No thrush, No neck mass, [place]/AT  Cardiovascular: IRRR, S1/S2, no rubs, no gallops  Respiratory: bibasilar rales.  Diminished BS bilateral  Abdomen: Soft/+BS, non tender, non distended, no guarding  Extremities: No edema, No lymphangitis, No petechiae, No rashes, no synovitis   Data Reviewed: I have personally reviewed following labs and imaging studies Basic Metabolic Panel: Recent Labs  Lab 04-14-19 1117 03/26/19 0418 03/27/19 0612  NA 138 138 140  K 4.5 4.0 4.2  CL 95* 103 106  CO2 27 25 26   GLUCOSE 163* 200* 89  BUN 20 29* 38*  CREATININE 0.84 1.23* 1.32*  CALCIUM 10.3 9.0 9.2  MG  --   --  2.0  PHOS  --   --  3.1   Liver Function Tests: Recent Labs  Lab 14-Apr-2019 1117 03/27/19 0612  AST 25 18  ALT 25 17  ALKPHOS 67 40  BILITOT 1.5* 1.0  PROT 7.5 5.3*  ALBUMIN 4.0 2.5*   Recent Labs  Lab 2019/04/14 1117  LIPASE 46   No results for input(s): AMMONIA in the last 168 hours. Coagulation Profile: No results for input(s): INR, PROTIME in the last 168 hours. CBC: Recent Labs  Lab 14-Apr-2019 1117 03/26/19 0418 03/27/19 0612  WBC 12.6* 13.9* 15.5*  NEUTROABS 11.2*  --   --   HGB 16.6* 12.3 11.4*  HCT 53.1* 38.8 35.9*  MCV 96.2 95.8 95.2  PLT 304 237 191   Cardiac Enzymes: Recent Labs  Lab 2019/04/14 1117  TROPONINI 0.03*   BNP: Invalid input(s): POCBNP CBG: Recent Labs  Lab 03/26/19 1609 03/26/19 1938 03/27/19 0010 03/27/19 0444 03/27/19 0747  GLUCAP 88 98 93 75 75   HbA1C: Recent Labs    03/26/19 0830  HGBA1C 5.3   Urine analysis:    Component Value Date/Time   COLORURINE AMBER (A) 2019/04/14 1158   APPEARANCEUR HAZY (A) 2019/04/14 1158   LABSPEC 1.027 04/14/2019 1158   PHURINE  5.0 04-14-2019 1158   GLUCOSEU NEGATIVE 04/14/2019 1158   HGBUR NEGATIVE Apr 14, 2019 1158   BILIRUBINUR NEGATIVE 2019/04/14 1158   KETONESUR 20 (A) 04-14-19 1158   PROTEINUR 100 (A) 04/14/2019 1158   UROBILINOGEN 0.2 07/24/2014 1534   NITRITE NEGATIVE 14-Apr-2019 1158   LEUKOCYTESUR NEGATIVE 04-14-19 1158   Sepsis Labs: @LABRCNTIP (procalcitonin:4,lacticidven:4) ) Recent Results (from the past 240 hour(s))  SARS Coronavirus 2 (CEPHEID - Performed in [place] hospital lab), Hosp Order     Status: None   Collection Time: Apr 14, 2019  2:26 PM  Result Value Ref Range Status   SARS Coronavirus 2 NEGATIVE NEGATIVE Final    Comment: (NOTE) If result is NEGATIVE SARS-CoV-2 target nucleic acids are NOT DETECTED. The SARS-CoV-2 RNA is generally detectable in upper and lower  respiratory specimens during the acute phase of infection. The lowest  concentration of SARS-CoV-2 viral copies this assay can detect is 250  copies / mL. A negative result does not preclude SARS-CoV-2 infection  and should not be used as the sole basis for treatment or other  patient management decisions.  A negative result may occur with  improper specimen collection / handling, submission of specimen other  than nasopharyngeal swab, presence of viral  mutation(s) within the  areas targeted by this assay, and inadequate number of viral copies  (<250 copies / mL). A negative result must be combined with clinical  observations, patient history, and epidemiological information. If result is POSITIVE SARS-CoV-2 target nucleic acids are DETECTED. The SARS-CoV-2 RNA is generally detectable in upper and lower  respiratory specimens dur ing the acute phase of infection.  Positive  results are indicative of active infection with SARS-CoV-2.  Clinical  correlation with patient history and other diagnostic information is  necessary to determine patient infection status.  Positive results do  not rule out bacterial infection  or co-infection with other viruses. If result is PRESUMPTIVE POSTIVE SARS-CoV-2 nucleic acids MAY BE PRESENT.   A presumptive positive result was obtained on the submitted specimen  and confirmed on repeat testing.  While 2019 novel coronavirus  (SARS-CoV-2) nucleic acids may be present in the submitted sample  additional confirmatory testing may be necessary for epidemiological  and / or clinical management purposes  to differentiate between  SARS-CoV-2 and other Sarbecovirus currently known to infect humans.  If clinically indicated additional testing with an alternate test  methodology [phone] is advised. The SARS-CoV-2 RNA is generally  detectable in upper and lower respiratory sp ecimens during the acute  phase of infection. The expected result is Negative. Fact Sheet for Patients:  BoilerBrush.com.cyhttps://www.fda.gov/media/136312/download Fact Sheet for Healthcare Providers: https://pope.com/https://www.fda.gov/media/136313/download This test is not yet approved or cleared by the [place] FDA and has been authorized for detection and/or diagnosis of SARS-CoV-2 by FDA under an Emergency Use Authorization (EUA).  This EUA will remain in effect (meaning this test can be used) for the duration of the COVID-19 declaration under Section 564(b)(1) of the Act, 21 U.S.C. section 360bbb-3(b)(1), unless the authorization is terminated or revoked sooner. Performed at [facility], [address] [place], [place] [address]   MRSA PCR Screening     Status: None   Collection Time: 03/24/2019  7:54 PM  Result Value Ref Range Status   MRSA by PCR NEGATIVE NEGATIVE Final    Comment:        The GeneXpert MRSA Assay (FDA approved for NASAL specimens only), is one component of a comprehensive MRSA colonization surveillance program. It is not intended to diagnose MRSA infection nor to guide or monitor treatment for MRSA infections. Performed at [facility], [address] [place], [place] [address]   Culture, blood  (Routine X 2) w Reflex to ID Panel     Status: None (Preliminary result)   Collection Time: 04/07/2019  9:50 PM  Result Value Ref Range Status   Specimen Description LEFT ANTECUBITAL  Final   Special Requests   Final    BOTTLES DRAWN AEROBIC AND ANAEROBIC Blood Culture adequate volume   Culture   Final    NO GROWTH 2 DAYS Performed at [facility], [address] [place], [place] [address]    Report Status PENDING  Incomplete     Scheduled Meds: . chlorhexidine  15 mL Mouth Rinse BID  . Chlorhexidine Gluconate Cloth  6 each Topical Daily  . Chlorhexidine Gluconate Cloth  6 each Topical Daily  . mouth rinse  15 mL Mouth Rinse q12n4p  . pantoprazole (PROTONIX) IV  40 mg Intravenous Q24H  . sodium chloride flush  10-40 mL Intracatheter Q12H  . sodium chloride flush  3 mL Intravenous Q12H   Continuous Infusions: . sodium chloride 250 mL (03/26/19 0934)  . azithromycin    . dextrose 5% lactated  ringers with KCl 20 mEq/L    . diltiazem (CARDIZEM) infusion 10 mg/hr (03/27/19 0450)  . heparin 600 Units/hr (03/26/19 2123)  . meropenem (MERREM) IV 1,000 mg (03/27/19 0941)    Procedures/Studies: Ct Abdomen Pelvis W Contrast  Result Date: 04/06/2019 CLINICAL DATA:  Constipation since Thursday. Vomiting. Hysterectomy. Appendectomy. High-grade bowel obstruction. EXAM: CT ABDOMEN AND PELVIS WITH CONTRAST TECHNIQUE: Multidetector CT imaging of the abdomen and pelvis was performed using the standard protocol following bolus administration of intravenous contrast. CONTRAST:  100mL OMNIPAQUE IOHEXOL 300 MG/ML  SOLN COMPARISON:  None. FINDINGS: Lower chest: Right base atelectasis. Left base collapse/consolidative change. Moderate cardiomegaly with small pericardial effusion. Small bilateral pleural effusions. A large hiatal hernia, with approximately 1/2 of the stomach positioned in the lower chest. Hepatobiliary: Anterior left hepatic lobe cyst. Cholecystectomy, without biliary ductal dilatation.  Pancreas: Normal, without mass or ductal dilatation. Spleen: Normal in size, without focal abnormality. Adrenals/Urinary Tract: Normal adrenal glands. Bilateral renal low-density lesions which are likely cysts and minimally complex cysts. Other renal lesions are too small to characterize. No hydronephrosis. Normal urinary bladder. Stomach/Bowel: The stomach is distended and fluid-filled. The left side of the colon is relatively decompressed with scattered diverticula. Proximal and mid small bowel loops are dilated including at up to 2.9 cm. This continues to the level of a transition point within the central pelvis, likely in the proximal ileum on image 55/3 and coronal image 41. No obstructive mass identified. No complicating ischemia. Vascular/Lymphatic: Aortic and branch vessel atherosclerosis. No abdominopelvic adenopathy. Reproductive: Hysterectomy.  No adnexal mass. Other: Mild pelvic floor laxity.  No free intraperitoneal air. Musculoskeletal: Lumbosacral spondylosis with moderate convex left lumbar spine curvature. IMPRESSION: 1. High-grade partial small-bowel obstruction, to the level of the mid small bowel. Most likely related to adhesions. No complicating ischemia. 2. Distended stomach with large hiatal hernia. The patient may benefit from nasogastric tube placement. 3. Small bilateral pleural effusions with left worse than right base airspace disease. On the right, likely atelectasis. On the left, infection or aspiration cannot be excluded. 4. Cardiomegaly with small pericardial effusion. 5.  Aortic Atherosclerosis (ICD10-I70.0). Electronically Signed   By: Jeronimo GreavesKyle  Talbot M.D.   On: 03/24/2019 14:02   Dg Chest Port 1 View  Result Date: 03/27/2019 CLINICAL DATA:  Acute respiratory failure with hypoxia. EXAM: PORTABLE CHEST 1 VIEW COMPARISON:  One-view chest x-ray 03/26/2019 FINDINGS: Heart is enlarged. Left hemidiaphragm is elevated. NG tube is in the stomach. Left-sided PICC line terminates in the right  atrium. Pulmonary vascular congestion is increased. Bilateral infiltrates have increased. IMPRESSION: 1. Increasing bilateral infiltrates, left greater than right. This is concerning for infection. 2. Elevation of left hemidiaphragm. 3. Stable cardiomegaly. 4. Left-sided PICC line terminates in the right atrium. Electronically Signed   By: Marin Robertshristopher  Mattern M.D.   On: 03/27/2019 08:37   Dg Chest Port 1 View  Result Date: 03/26/2019 CLINICAL DATA:  Follow-up PICC line placement EXAM: PORTABLE CHEST 1 VIEW COMPARISON:  03/24/2019 FINDINGS: Left-sided PICC line is noted with catheter tip at the cavoatrial junction. Gastric catheter is noted coiled within the stomach. Elevation of the left hemidiaphragm is noted with some associated infiltrates bilaterally left greater than right similar to that seen on the prior exam. No bony abnormality is noted. IMPRESSION: Stable infiltrates bilaterally left greater than right. New left-sided PICC line in satisfactory position. Electronically Signed   By: Alcide CleverMark  Lukens M.D.   On: 03/26/2019 12:46   Dg Chest Port 1 View  Result Date: 04/14/2019  CLINICAL DATA:  NG tube placement. EXAM: PORTABLE CHEST 1 VIEW COMPARISON:  08-18-2019 and CT 08-18-2019 FINDINGS: Patient is slightly rotated to the left. Nasogastric tube is coiled once over the stomach in patient's known large hiatal hernia and then proceeds inferiorly over the left upper quadrant and off the film as tip is not visualized. Non radiopaque side-port is over the stomach in the left upper abdomen. Exam demonstrates adequate lung volumes with worsening bilateral perihilar opacification suggesting interstitial edema and less likely infection. Persistent opacification over the left base likely effusion with atelectasis. Stable mild hazy density over the right base which may represent a small effusion with atelectasis. Infection over the lung bases is possible. Stable cardiomegaly. Remainder of the exam is unchanged.  IMPRESSION: Worsening bilateral perihilar opacification suggesting worsening interstitial edema. Stable bibasilar opacification likely small effusions left greater than right with associated basilar atelectasis. Infection in the mid to lower lungs is possible. Stable cardiomegaly. Nasogastric tube coiled once over the large hiatal hernia with side-port over the stomach in the left upper quadrant and tip not visualized. Electronically Signed   By: Elberta Fortisaniel  Boyle M.D.   On: 08-18-2019 20:28   Dg Chest Portable 1 View  Result Date: 04/04/2019 CLINICAL DATA:  Nasogastric tube placement EXAM: PORTABLE CHEST 1 VIEW COMPARISON:  Abdominal CT from earlier today FINDINGS: Nasogastric tube which at least reaches the stomach (which is low lying and distended by CT). There is haziness at both lung bases where there is atelectasis and pleural fluid by prior CT. Cardiopericardial enlargement. IMPRESSION: 1. The nasogastric tube reaches the stomach at least. 2. Cardiomegaly with bilateral pleural effusion obscuring the lower lobes. Electronically Signed   By: Marnee SpringJonathon  Watts M.D.   On: 08-18-2019 16:36   Dg Abd Portable 2v  Result Date: 03/26/2019 CLINICAL DATA:  Small bowel obstruction, colon resection. EXAM: PORTABLE ABDOMEN - 2 VIEW COMPARISON:  CT abdomen pelvis 08-18-2019. FINDINGS: Nasogastric tube terminates in the stomach. Persistent small bowel dilatation. No minimal colonic gas. Retained contrast in the bladder with Foley catheter in place. Postoperative changes in the left lower quadrant. Incidental imaging of the lower chest shows left lower lobe consolidation with patchy airspace opacification bilaterally. Bilateral pleural effusions are noted as well. IMPRESSION: 1. Persistent small bowel obstruction. 2. Left lower lobe consolidation with patchy bilateral airspace opacification and bilateral pleural effusions, better evaluated on chest radiograph done the same day. Electronically Signed   By: Leanna BattlesMelinda  Blietz  M.D.   On: 03/26/2019 12:46    Catarina Hartshornavid Mykel Sponaugle, DO  [facility] Pager [phone]  If 7PM-7AM, please contact night-coverage [email or web address] Password [email or web address] 03/27/2019, 10:38 AM   LOS: 2 days

## 2019-03-27 NOTE — Progress Notes (Signed)
Urine output total for this shift was 131ml via foley. Dr. Carles Collet made aware. Fluids increased per orders.

## 2019-03-27 NOTE — Progress Notes (Signed)
ANTICOAGULATION CONSULT NOTE - Initial Consult  Pharmacy Consult for lovenox--> heparin Indication: atrial fibrillation  Allergies  Allergen Reactions  . Codeine   . Penicillins   . Sulfa Antibiotics     Patient Measurements: Height: 5\' 4"  (162.6 cm) Weight: 108 lb 0.4 oz (49 kg) IBW/kg (Calculated) : 54.7  HEPARIN DW (KG): 47.6  Vital Signs: Temp: 99.1 F (37.3 C) (06/09 0744) Temp Source: Oral (06/09 0744) BP: 106/50 (06/09 0830) Pulse Rate: 86 (06/09 0830)  Labs: Recent Labs    04/02/2019 1117 03/26/19 0418 03/26/19 0956 03/27/19 0612  HGB 16.6* 12.3  --  11.4*  HCT 53.1* 38.8  --  35.9*  PLT 304 237  --  191  APTT  --   --  35 45*  HEPARINUNFRC  --   --  1.16* 0.70  CREATININE 0.84 1.23*  --  1.32*  TROPONINI 0.03*  --   --   --     Estimated Creatinine Clearance: 22.8 mL/min (A) (by C-G formula based on SCr of 1.32 mg/dL (H)).   Medical History: Past Medical History:  Diagnosis Date  . Colonic diverticular abscess   . Essential hypertension   . Osteopenia    09/2014 tab T score -1.1  . Permanent atrial fibrillation    Diagnosed December 2019 - Dr. Darral Dash    Medications:  Medications Prior to Admission  Medication Sig Dispense Refill Last Dose  . apixaban (ELIQUIS) 2.5 MG TABS tablet Take 2.5 mg by mouth 2 (two) times daily.    03/24/2019 at Unknown time  . aspirin 81 MG tablet Take 81 mg by mouth daily.   unknown  . Calcium Carbonate-Vitamin D (CALCIUM + D PO) Take 1 tablet by mouth daily.    unknown  . digoxin (LANOXIN) 0.125 MG tablet Take 0.125 mg by mouth daily.   03/24/2019 at Unknown time  . furosemide (LASIX) 20 MG tablet Take 20 mg by mouth See admin instructions. Take one tablet by mouth daily 5 days per week   03/23/2019 at Unknown time  . losartan (COZAAR) 50 MG tablet Take 50 mg by mouth daily.    03/24/2019 at Unknown time  . metoprolol tartrate (LOPRESSOR) 100 MG tablet Take 100 mg by mouth 2 (two) times daily.    03/24/2019 at Unknown time  .  Multiple Vitamin (MULTIVITAMIN) tablet Take 1 tablet by mouth daily.   03/24/2019  . ondansetron (ZOFRAN) 4 MG tablet Take 4 mg by mouth daily as needed for nausea or vomiting.   03/24/2019 at Unknown time  . potassium chloride (K-DUR) 10 MEQ tablet Take 10 mEq by mouth See admin instructions. Take one tablet daily by mouth 5 days per week   03/23/2019 at Unknown time  . Cholecalciferol (VITAMIN D PO) Take 1 capsule by mouth daily.    03/24/2019    Assessment: Pharmacy consulted to dose heparin(was on lovenox) for patient with atrial fibrillation. Renal function worsened so switching to heparin.  Patient was on apixaban prior to admission with last dose given 6/7 AM, last dose of lovenox given 6/7 at 2100. Will order APTT and HL baseline. Monitor with APTT levels to adjust dosing d/t xarelto interference, until APTT and HL correlate Will start heparin at 2100(24 hours after last lovenox dose).    6/9 AM UPDATE: aPTT is 45 seconds and heparin level is 0.7 IU/mL Will decrease heparin drip by just 50 units/hr  Can adjust by heparin levels from now on  Goal of Therapy:  APTT 66-102  Heparin level 0.3-0.7 Monitor platelets by anticoagulation protocol: Yes    Plan: watch platelet count closely for further decrease to 50 % of baseline Reduce  heparin infusion to 550 units/hr for HL of 0.7 IU/mL Check   anti-Xa level   daily while on heparin Continue to monitor H&H and platelets   Tama Highamara Helton Oleson, Pharm. D. Clinical Pharmacist 03/27/2019 10:44 AM

## 2019-03-27 NOTE — Progress Notes (Signed)
Progress Note  Patient Name: Female Iafrate Date of Encounter: 03/27/2019  Primary Cardiologist: Raechel Chute, MD [facility])  Subjective   Patient more alert today.  States that her abdomen is not tender at this time, no nausea or emesis.  NG tube in place.  Inpatient Medications    Scheduled Meds:  chlorhexidine  15 mL Mouth Rinse BID   Chlorhexidine Gluconate Cloth  6 each Topical Daily   Chlorhexidine Gluconate Cloth  6 each Topical Daily   insulin aspart  0-9 Units Subcutaneous Q4H   mouth rinse  15 mL Mouth Rinse q12n4p   pantoprazole (PROTONIX) IV  40 mg Intravenous Q24H   sodium chloride flush  10-40 mL Intracatheter Q12H   sodium chloride flush  3 mL Intravenous Q12H   Continuous Infusions:  sodium chloride 250 mL (03/26/19 0934)   diltiazem (CARDIZEM) infusion 10 mg/hr (03/27/19 0450)   heparin 600 Units/hr (03/26/19 2123)   meropenem (MERREM) IV 1,000 mg (03/27/19 0941)   PRN Meds: sodium chloride, acetaminophen **OR** acetaminophen, albuterol, ondansetron **OR** ondansetron (ZOFRAN) IV, phenol, polyethylene glycol, sodium chloride flush, sodium chloride flush, traZODone   Vital Signs    Vitals:   03/27/19 0730 03/27/19 0744 03/27/19 0800 03/27/19 0830  BP: (!) 102/56  (!) 104/47 (!) 106/50  Pulse: 98  91 86  Resp: (!) 28  (!) 39 (!) 38  Temp:  99.1 F (37.3 C)    TempSrc:  Oral    SpO2: 96%  95% 92%  Weight:      Height:        Intake/Output Summary (Last 24 hours) at 03/27/2019 0959 Last data filed at 03/27/2019 0937 Gross per 24 hour  Intake 690.87 ml  Output 875 ml  Net -184.13 ml   Filed Weights   April 15, 2019 1815 03/26/19 0500 03/27/19 0500  Weight: 47.6 kg 48.3 kg 49 kg    Telemetry    Atrial fibrillation with RVR.  Personally reviewed.  ECG    Tracing from 15-Apr-2019 showed atrial fibrillation/flutter with decreased R wave progression and leftward axis.  Personally reviewed.  Physical Exam   GEN:  Frail-appearing  elderly woman, no acute distress.  NG tube in place. Neck: No JVD. Cardiac:  Irregularly irregular, 3/6 systolic murmur, no gallop.  Respiratory: Nonlabored.  Decreased breath sounds without wheezing. GI: Nontender, bowel sounds diminished. MS: No edema; No deformity. Neuro:  Nonfocal. Psych: Alert and oriented x 3. Normal affect.  Labs    Chemistry Recent Labs  Lab 04-15-19 1117 03/26/19 0418 03/27/19 0612  NA 138 138 140  K 4.5 4.0 4.2  CL 95* 103 106  CO2 27 25 26   GLUCOSE 163* 200* 89  BUN 20 29* 38*  CREATININE 0.84 1.23* 1.32*  CALCIUM 10.3 9.0 9.2  PROT 7.5  --  5.3*  ALBUMIN 4.0  --  2.5*  AST 25  --  18  ALT 25  --  17  ALKPHOS 67  --  40  BILITOT 1.5*  --  1.0  GFRNONAA >60 39* 36*  GFRAA >60 45* 42*  ANIONGAP 16* 10 8     Hematology Recent Labs  Lab 04-15-2019 1117 03/26/19 0418 03/27/19 0612  WBC 12.6* 13.9* 15.5*  RBC 5.52* 4.05 3.77*  HGB 16.6* 12.3 11.4*  HCT 53.1* 38.8 35.9*  MCV 96.2 95.8 95.2  MCH 30.1 30.4 30.2  MCHC 31.3 31.7 31.8  RDW 13.4 13.6 14.1  PLT 304 237 191    Cardiac Enzymes Recent Labs  Lab  03-18-2019 1117  TROPONINI 0.03*   No results for input(s): TROPIPOC in the last 168 hours.   BNP Recent Labs  Lab 03/26/19 0830  BNP 950.0*     Radiology    Ct Abdomen Pelvis W Contrast  Result Date: 04/16/2019 CLINICAL DATA:  Constipation since Thursday. Vomiting. Hysterectomy. Appendectomy. High-grade bowel obstruction. EXAM: CT ABDOMEN AND PELVIS WITH CONTRAST TECHNIQUE: Multidetector CT imaging of the abdomen and pelvis was performed using the standard protocol following bolus administration of intravenous contrast. CONTRAST:  100mL OMNIPAQUE IOHEXOL 300 MG/ML  SOLN COMPARISON:  None. FINDINGS: Lower chest: Right base atelectasis. Left base collapse/consolidative change. Moderate cardiomegaly with small pericardial effusion. Small bilateral pleural effusions. A large hiatal hernia, with approximately 1/2 of the stomach  positioned in the lower chest. Hepatobiliary: Anterior left hepatic lobe cyst. Cholecystectomy, without biliary ductal dilatation. Pancreas: Normal, without mass or ductal dilatation. Spleen: Normal in size, without focal abnormality. Adrenals/Urinary Tract: Normal adrenal glands. Bilateral renal low-density lesions which are likely cysts and minimally complex cysts. Other renal lesions are too small to characterize. No hydronephrosis. Normal urinary bladder. Stomach/Bowel: The stomach is distended and fluid-filled. The left side of the colon is relatively decompressed with scattered diverticula. Proximal and mid small bowel loops are dilated including at up to 2.9 cm. This continues to the level of a transition point within the central pelvis, likely in the proximal ileum on image 55/3 and coronal image 41. No obstructive mass identified. No complicating ischemia. Vascular/Lymphatic: Aortic and branch vessel atherosclerosis. No abdominopelvic adenopathy. Reproductive: Hysterectomy.  No adnexal mass. Other: Mild pelvic floor laxity.  No free intraperitoneal air. Musculoskeletal: Lumbosacral spondylosis with moderate convex left lumbar spine curvature. IMPRESSION: 1. High-grade partial small-bowel obstruction, to the level of the mid small bowel. Most likely related to adhesions. No complicating ischemia. 2. Distended stomach with large hiatal hernia. The patient may benefit from nasogastric tube placement. 3. Small bilateral pleural effusions with left worse than right base airspace disease. On the right, likely atelectasis. On the left, infection or aspiration cannot be excluded. 4. Cardiomegaly with small pericardial effusion. 5.  Aortic Atherosclerosis (ICD10-I70.0). Electronically Signed   By: Jeronimo GreavesKyle  Talbot M.D.   On: 05-31-202020 14:02   Dg Chest Port 1 View  Result Date: 03/27/2019 CLINICAL DATA:  Acute respiratory failure with hypoxia. EXAM: PORTABLE CHEST 1 VIEW COMPARISON:  One-view chest x-ray 03/26/2019  FINDINGS: Heart is enlarged. Left hemidiaphragm is elevated. NG tube is in the stomach. Left-sided PICC line terminates in the right atrium. Pulmonary vascular congestion is increased. Bilateral infiltrates have increased. IMPRESSION: 1. Increasing bilateral infiltrates, left greater than right. This is concerning for infection. 2. Elevation of left hemidiaphragm. 3. Stable cardiomegaly. 4. Left-sided PICC line terminates in the right atrium. Electronically Signed   By: Marin Robertshristopher  Mattern M.D.   On: 03/27/2019 08:37   Dg Chest Port 1 View  Result Date: 03/26/2019 CLINICAL DATA:  Follow-up PICC line placement EXAM: PORTABLE CHEST 1 VIEW COMPARISON:  05-31-202020 FINDINGS: Left-sided PICC line is noted with catheter tip at the cavoatrial junction. Gastric catheter is noted coiled within the stomach. Elevation of the left hemidiaphragm is noted with some associated infiltrates bilaterally left greater than right similar to that seen on the prior exam. No bony abnormality is noted. IMPRESSION: Stable infiltrates bilaterally left greater than right. New left-sided PICC line in satisfactory position. Electronically Signed   By: Alcide CleverMark  Lukens M.D.   On: 03/26/2019 12:46   Dg Chest Port 1 View  Result Date:  03/19/2019 CLINICAL DATA:  NG tube placement. EXAM: PORTABLE CHEST 1 VIEW COMPARISON:  2019-01-17 and CT 2019-01-17 FINDINGS: Patient is slightly rotated to the left. Nasogastric tube is coiled once over the stomach in patient's known large hiatal hernia and then proceeds inferiorly over the left upper quadrant and off the film as tip is not visualized. Non radiopaque side-port is over the stomach in the left upper abdomen. Exam demonstrates adequate lung volumes with worsening bilateral perihilar opacification suggesting interstitial edema and less likely infection. Persistent opacification over the left base likely effusion with atelectasis. Stable mild hazy density over the right base which may represent a small  effusion with atelectasis. Infection over the lung bases is possible. Stable cardiomegaly. Remainder of the exam is unchanged. IMPRESSION: Worsening bilateral perihilar opacification suggesting worsening interstitial edema. Stable bibasilar opacification likely small effusions left greater than right with associated basilar atelectasis. Infection in the mid to lower lungs is possible. Stable cardiomegaly. Nasogastric tube coiled once over the large hiatal hernia with side-port over the stomach in the left upper quadrant and tip not visualized. Electronically Signed   By: Elberta Fortisaniel  Boyle M.D.   On: 2019-01-17 20:28   Dg Chest Portable 1 View  Result Date: 03/19/2019 CLINICAL DATA:  Nasogastric tube placement EXAM: PORTABLE CHEST 1 VIEW COMPARISON:  Abdominal CT from earlier today FINDINGS: Nasogastric tube which at least reaches the stomach (which is low lying and distended by CT). There is haziness at both lung bases where there is atelectasis and pleural fluid by prior CT. Cardiopericardial enlargement. IMPRESSION: 1. The nasogastric tube reaches the stomach at least. 2. Cardiomegaly with bilateral pleural effusion obscuring the lower lobes. Electronically Signed   By: Marnee SpringJonathon  Watts M.D.   On: 2019-01-17 16:36   Dg Abd Portable 2v  Result Date: 03/26/2019 CLINICAL DATA:  Small bowel obstruction, colon resection. EXAM: PORTABLE ABDOMEN - 2 VIEW COMPARISON:  CT abdomen pelvis 2019-01-17. FINDINGS: Nasogastric tube terminates in the stomach. Persistent small bowel dilatation. No minimal colonic gas. Retained contrast in the bladder with Foley catheter in place. Postoperative changes in the left lower quadrant. Incidental imaging of the lower chest shows left lower lobe consolidation with patchy airspace opacification bilaterally. Bilateral pleural effusions are noted as well. IMPRESSION: 1. Persistent small bowel obstruction. 2. Left lower lobe consolidation with patchy bilateral airspace opacification and  bilateral pleural effusions, better evaluated on chest radiograph done the same day. Electronically Signed   By: Leanna BattlesMelinda  Blietz M.D.   On: 03/26/2019 12:46    Cardiac Studies   Echocardiogram 03/26/2019:  1. The left ventricle has hyperdynamic systolic function, with an ejection fraction of >65%. The cavity size was normal. There is moderate asymmetric left ventricular hypertrophy with sigmoid shaped septum. Left ventricular diastolic Doppler parameters  are indeterminate in the setting of atrial fibrillation. No evidence of left ventricular regional wall motion abnormalities.  2. The right ventricle has normal systolic function. The cavity was normal. There is no increase in right ventricular wall thickness. Right ventricular systolic pressure is mildly elevated with an estimated pressure of 40.1 mmHg.  3. Left atrial size was severely dilated.  4. Right atrial size was mildly dilated.  5. Moderate pericardial effusion.  6. The pericardial effusion is anterior to the right ventricle.  7. The mitral valve is grossly normal. Mild thickening of the mitral valve leaflet. There is mild mitral annular calcification present. Mitral valve regurgitation is moderate by color flow Doppler. The MR jet is posteriorly-directed.  8. The aortic valve  is tricuspid. Mild calcification of the aortic valve. Mild aortic annular calcification noted. There is increased LVOT/transaortic velocity that reflects small LVOT with turbulent flow and mitral SAM, although not clearly aortic  stenosis.  9. The tricuspid valve is grossly normal. 10. The aortic root is normal in size and structure. 11. The inferior vena cava was normal in size with <50% respiratory variability.  Patient Profile     85 y.o. female with a history of permanent atrial fibrillation diagnosed in December 2019 (also intermittent atrial flutter based on records) and hypertension currently admitted with small bowel obstruction and sepsis secondary to  pneumonia.  Assessment & Plan    1.  Permanent atrial fibrillation with intermittent atrial flutter, diagnosed in December 2019 and followed by Dr. Kathryne SharperKotlaba.  She currently has RVR in the setting of physiologic stress with comorbid illnesses.  Also n.p.o. due to small bowel obstruction with NG tube in place, has been placed on intravenous diltiazem which she is tolerating although blood pressure currently limits further up titration.  Eliquis is currently on hold.  2.  Heart murmur secondary to hyperdynamic LVEF with turbulent flow in the LVOT and anterior systolic motion of the mitral valve.  Aortic valve is mildly sclerotic but not stenotic.  She does have moderate mitral regurgitation.  3.  Small bowel obstruction, currently undergoing decompression and clinically improving at this point.  She has been seen by Surgery.  Operation is not planned at this time.  4.  Sepsis with pneumonia, currently on antibiotics and afebrile.  She still has oxygen requirement.  Chest x-ray shows increasing bilateral infiltrates, left greater than right.  Continue supportive measures and management by primary team.  Would continue intravenous diltiazem as long as she is tolerating, as noted above blood pressure has limited upward titration and heart rate remains elevated, but a lot of this is due to physiologic stress.  Depending on clinical course, may consider adding IV Lopressor on standing basis as she seemed to tolerate beta-blocker therapy better as an outpatient.  She is on IV heparin at this time, Eliquis has been discontinued.  Discussed with patient and son.  Signed, Nona DellSamuel Kalicia Dufresne, MD  03/27/2019, 9:59 AM

## 2019-03-27 NOTE — Progress Notes (Signed)
Late entry 0100- RN went in room to check on pt- RR 40x/min- RN woke pt up to put bipap back on. Pts RR dropped to 18-22, pt alert and following commands.  Left pt on HFNC @ 4L. When pt sleeps her RR increases and when awake, decreases.

## 2019-03-27 NOTE — Progress Notes (Signed)
[facility] Progress Note     Subjective: Went back on Bipap for RR to 40 overnight.  On HFNC now, doing well. No complaints. No abdominal pain. HR continues to fluctuate from 105-150. No BM or flatus, NG output down but still dark.   Objective: Vital signs in last 24 hours: Temp:  [97.4 F (36.3 C)-101.3 F (38.5 C)] 99.1 F (37.3 C) (06/09 0744) Pulse Rate:  [68-172] 86 (06/09 0830) Resp:  [10-41] 38 (06/09 0830) BP: (81-117)/(40-64) 106/50 (06/09 0830) SpO2:  [92 %-100 %] 92 % (06/09 0830) Weight:  [49 kg] 49 kg (06/09 0500) Last BM Date: 03/21/19  Intake/Output from previous day: 06/08 0701 - 06/09 0700 In: 670.9 [P.O.:50; I.V.:420.9; IV Piggyback:200] Out: 875 [Urine:625; Emesis/NG output:250] Intake/Output this shift: Total I/O In: 26 [I.V.:26] Out: -   General appearance: alert, cooperative and no distress Resp: HFNC, IS pulling 400 GI: soft, mildly distended, nontender  Lab Results:  Recent Labs    03/26/19 0418 03/27/19 0612  WBC 13.9* 15.5*  HGB 12.3 11.4*  HCT 38.8 35.9*  PLT 237 191   BMET Recent Labs    03/26/19 0418 03/27/19 0612  NA 138 140  K 4.0 4.2  CL 103 106  CO2 25 26  GLUCOSE 200* 89  BUN 29* 38*  CREATININE 1.23* 1.32*  CALCIUM 9.0 9.2    Studies/Results: Ct Abdomen Pelvis W Contrast  Result Date: 04/17/2019 CLINICAL DATA:  Constipation since Thursday. Vomiting. Hysterectomy. Appendectomy. High-grade bowel obstruction. EXAM: CT ABDOMEN AND PELVIS WITH CONTRAST TECHNIQUE: Multidetector CT imaging of the abdomen and pelvis was performed using the standard protocol following bolus administration of intravenous contrast. CONTRAST:  100mL OMNIPAQUE IOHEXOL 300 MG/ML  SOLN COMPARISON:  None. FINDINGS: Lower chest: Right base atelectasis. Left base collapse/consolidative change. Moderate cardiomegaly with small pericardial effusion. Small bilateral pleural effusions. A large hiatal hernia, with approximately 1/2 of the  stomach positioned in the lower chest. Hepatobiliary: Anterior left hepatic lobe cyst. Cholecystectomy, without biliary ductal dilatation. Pancreas: Normal, without mass or ductal dilatation. Spleen: Normal in size, without focal abnormality. Adrenals/Urinary Tract: Normal adrenal glands. Bilateral renal low-density lesions which are likely cysts and minimally complex cysts. Other renal lesions are too small to characterize. No hydronephrosis. Normal urinary bladder. Stomach/Bowel: The stomach is distended and fluid-filled. The left side of the colon is relatively decompressed with scattered diverticula. Proximal and mid small bowel loops are dilated including at up to 2.9 cm. This continues to the level of a transition point within the central pelvis, likely in the proximal ileum on image 55/3 and coronal image 41. No obstructive mass identified. No complicating ischemia. Vascular/Lymphatic: Aortic and branch vessel atherosclerosis. No abdominopelvic adenopathy. Reproductive: Hysterectomy.  No adnexal mass. Other: Mild pelvic floor laxity.  No free intraperitoneal air. Musculoskeletal: Lumbosacral spondylosis with moderate convex left lumbar spine curvature. IMPRESSION: 1. High-grade partial small-bowel obstruction, to the level of the mid small bowel. Most likely related to adhesions. No complicating ischemia. 2. Distended stomach with large hiatal hernia. The patient may benefit from nasogastric tube placement. 3. Small bilateral pleural effusions with left worse than right base airspace disease. On the right, likely atelectasis. On the left, infection or aspiration cannot be excluded. 4. Cardiomegaly with small pericardial effusion. 5.  Aortic Atherosclerosis (ICD10-I70.0). Electronically Signed   By: Jeronimo GreavesKyle  Talbot M.D.   On: 2019/04/25 14:02   Dg Chest Port 1 View  Result Date: 03/27/2019 CLINICAL DATA:  Acute respiratory failure with hypoxia. EXAM: PORTABLE CHEST 1  VIEW COMPARISON:  One-view chest x-ray  03/26/2019 FINDINGS: Heart is enlarged. Left hemidiaphragm is elevated. NG tube is in the stomach. Left-sided PICC line terminates in the right atrium. Pulmonary vascular congestion is increased. Bilateral infiltrates have increased. IMPRESSION: 1. Increasing bilateral infiltrates, left greater than right. This is concerning for infection. 2. Elevation of left hemidiaphragm. 3. Stable cardiomegaly. 4. Left-sided PICC line terminates in the right atrium. Electronically Signed   By: San Morelle M.D.   On: 03/27/2019 08:37   Dg Chest Port 1 View  Result Date: 03/26/2019 CLINICAL DATA:  Follow-up PICC line placement EXAM: PORTABLE CHEST 1 VIEW COMPARISON:  04/03/2019 FINDINGS: Left-sided PICC line is noted with catheter tip at the cavoatrial junction. Gastric catheter is noted coiled within the stomach. Elevation of the left hemidiaphragm is noted with some associated infiltrates bilaterally left greater than right similar to that seen on the prior exam. No bony abnormality is noted. IMPRESSION: Stable infiltrates bilaterally left greater than right. New left-sided PICC line in satisfactory position. Electronically Signed   By: Inez Catalina M.D.   On: 03/26/2019 12:46   Dg Chest Port 1 View  Result Date: 04/01/2019 CLINICAL DATA:  NG tube placement. EXAM: PORTABLE CHEST 1 VIEW COMPARISON:  03/30/2019 and CT 04/07/2019 FINDINGS: Patient is slightly rotated to the left. Nasogastric tube is coiled once over the stomach in patient's known large hiatal hernia and then proceeds inferiorly over the left upper quadrant and off the film as tip is not visualized. Non radiopaque side-port is over the stomach in the left upper abdomen. Exam demonstrates adequate lung volumes with worsening bilateral perihilar opacification suggesting interstitial edema and less likely infection. Persistent opacification over the left base likely effusion with atelectasis. Stable mild hazy density over the right base which may  represent a small effusion with atelectasis. Infection over the lung bases is possible. Stable cardiomegaly. Remainder of the exam is unchanged. IMPRESSION: Worsening bilateral perihilar opacification suggesting worsening interstitial edema. Stable bibasilar opacification likely small effusions left greater than right with associated basilar atelectasis. Infection in the mid to lower lungs is possible. Stable cardiomegaly. Nasogastric tube coiled once over the large hiatal hernia with side-port over the stomach in the left upper quadrant and tip not visualized. Electronically Signed   By: Marin Olp M.D.   On: 03/21/2019 20:28   Dg Chest Portable 1 View  Result Date: 04/08/2019 CLINICAL DATA:  Nasogastric tube placement EXAM: PORTABLE CHEST 1 VIEW COMPARISON:  Abdominal CT from earlier today FINDINGS: Nasogastric tube which at least reaches the stomach (which is low lying and distended by CT). There is haziness at both lung bases where there is atelectasis and pleural fluid by prior CT. Cardiopericardial enlargement. IMPRESSION: 1. The nasogastric tube reaches the stomach at least. 2. Cardiomegaly with bilateral pleural effusion obscuring the lower lobes. Electronically Signed   By: Monte Fantasia M.D.   On: 04/01/2019 16:36   Dg Abd Portable 2v  Result Date: 03/26/2019 CLINICAL DATA:  Small bowel obstruction, colon resection. EXAM: PORTABLE ABDOMEN - 2 VIEW COMPARISON:  CT abdomen pelvis 04/06/2019. FINDINGS: Nasogastric tube terminates in the stomach. Persistent small bowel dilatation. No minimal colonic gas. Retained contrast in the bladder with Foley catheter in place. Postoperative changes in the left lower quadrant. Incidental imaging of the lower chest shows left lower lobe consolidation with patchy airspace opacification bilaterally. Bilateral pleural effusions are noted as well. IMPRESSION: 1. Persistent small bowel obstruction. 2. Left lower lobe consolidation with patchy bilateral airspace  opacification  and bilateral pleural effusions, better evaluated on chest radiograph done the same day. Electronically Signed   By: Leanna BattlesMelinda  Blietz M.D.   On: 03/26/2019 12:46    Anti-infectives: Anti-infectives (From admission, onward)   Start     Dose/Rate Route Frequency Ordered Stop   03/26/19 0900  meropenem (MERREM) 1,000 mg in sodium chloride 0.9 % 100 mL IVPB     1,000 mg 200 mL/hr over 30 Minutes Intravenous Every 12 hours 03/26/19 0814     05/22/2019 2200  cefTRIAXone (ROCEPHIN) 2 g in sodium chloride 0.9 % 100 mL IVPB  Status:  Discontinued     2 g 200 mL/hr over 30 Minutes Intravenous Every 24 hours 05/22/2019 2044 03/26/19 0814   05/22/2019 2100  metroNIDAZOLE (FLAGYL) IVPB 500 mg  Status:  Discontinued     500 mg 100 mL/hr over 60 Minutes Intravenous Every 8 hours 05/22/2019 2044 03/26/19 [id number]      Assessment/Plan: Carrie Morales is a 85 yo with multiple issues going on including PNA likely from aspiration, A fib with RVR, CHF in the setting of a SBO.  She has no abdominal pain complaints but has not had any bowel function. NG is in place and decreased output but it is still very dark.   -NPO, NG, would do a prolonged trial of nonoperative management due to comorbidities and current heart/ respiratory issues. I do not think she will fair well with surgery and will end up remaining intubated.  We discussed yesterday nonoperative management success, and I am hoping she will improve with time.  -Leukocytosis up but no abdominal pain, think this is again related to PNA  -Discussed with Dr. Arbutus Leasat.    LOS: 2 days    Carrie Morales 03/27/2019

## 2019-03-28 DIAGNOSIS — I5032 Chronic diastolic (congestive) heart failure: Secondary | ICD-10-CM

## 2019-03-28 DIAGNOSIS — I5031 Acute diastolic (congestive) heart failure: Secondary | ICD-10-CM

## 2019-03-28 LAB — CBC
HCT: 36.9 % (ref 36.0–46.0)
Hemoglobin: 11.3 g/dL — ABNORMAL LOW (ref 12.0–15.0)
MCH: 29.9 pg (ref 26.0–34.0)
MCHC: 30.6 g/dL (ref 30.0–36.0)
MCV: 97.6 fL (ref 80.0–100.0)
Platelets: 191 10*3/uL (ref 150–400)
RBC: 3.78 MIL/uL — ABNORMAL LOW (ref 3.87–5.11)
RDW: 14.1 % (ref 11.5–15.5)
WBC: 18.1 10*3/uL — ABNORMAL HIGH (ref 4.0–10.5)
nRBC: 0 % (ref 0.0–0.2)

## 2019-03-28 LAB — COMPREHENSIVE METABOLIC PANEL
ALT: 16 U/L (ref 0–44)
AST: 16 U/L (ref 15–41)
Albumin: 2.4 g/dL — ABNORMAL LOW (ref 3.5–5.0)
Alkaline Phosphatase: 53 U/L (ref 38–126)
Anion gap: 8 (ref 5–15)
BUN: 44 mg/dL — ABNORMAL HIGH (ref 8–23)
CO2: 24 mmol/L (ref 22–32)
Calcium: 9.4 mg/dL (ref 8.9–10.3)
Chloride: 107 mmol/L (ref 98–111)
Creatinine, Ser: 1.14 mg/dL — ABNORMAL HIGH (ref 0.44–1.00)
GFR calc Af Amer: 50 mL/min — ABNORMAL LOW (ref >60)
GFR calc non Af Amer: 43 mL/min — ABNORMAL LOW (ref >60)
Glucose, Bld: 132 mg/dL — ABNORMAL HIGH (ref 70–99)
Potassium: 4.3 mmol/L (ref 3.5–5.1)
Sodium: 139 mmol/L (ref 135–145)
Total Bilirubin: 1.2 mg/dL (ref 0.3–1.2)
Total Protein: 5.2 g/dL — ABNORMAL LOW (ref 6.5–8.1)

## 2019-03-28 LAB — HEPARIN LEVEL (UNFRACTIONATED): Heparin Unfractionated: 0.68 IU/mL (ref 0.30–0.70)

## 2019-03-28 LAB — PROCALCITONIN: Procalcitonin: 3.71 ng/mL

## 2019-03-28 MED ORDER — BISACODYL 10 MG RE SUPP
10.0000 mg | Freq: Every day | RECTAL | Status: DC
  Administered 2019-03-28 – 2019-03-29 (×2): 10 mg via RECTAL
  Filled 2019-03-28 (×2): qty 1

## 2019-03-28 MED ORDER — METOPROLOL TARTRATE 5 MG/5ML IV SOLN
2.5000 mg | Freq: Three times a day (TID) | INTRAVENOUS | Status: DC
  Administered 2019-03-28 – 2019-03-30 (×8): 2.5 mg via INTRAVENOUS
  Filled 2019-03-28 (×8): qty 5

## 2019-03-28 MED ORDER — FUROSEMIDE 10 MG/ML IJ SOLN
20.0000 mg | Freq: Every day | INTRAMUSCULAR | Status: DC
  Administered 2019-03-28 – 2019-03-30 (×3): 20 mg via INTRAVENOUS
  Filled 2019-03-28 (×4): qty 2

## 2019-03-28 NOTE — Progress Notes (Signed)
PROGRESS NOTE  Carrie Morales UJW:[id number] DOB: 11/15/1927 DOA: 03/31/2019 PCP: Alinda DeemJannach, Stephen, MD  Brief History:  85 year old female with a history of chronic atrial fibrillation on apixaban, diastolic CHF, hypertension, bowel obstruction status post bowel resection 30 years ago presenting with 1 week history of abdominal pain that worsened over the past 2 to 3 days to the point of resulting in nausea and vomiting.  The patient was also having constipation-like symptoms and tried some over-the-counter laxatives without relief.  Because of her progressive symptoms she presented for further evaluation.  CT of the abdomen and pelvis in the ED showed left basilar collapse less consolidative changes, small bilateral pleural effusions, left greater than right with a large hiatus hernia.  There was also dilatation of the proximal and mid small bowel loops up to 2.9 cm consistent with a high-grade SBO.  The patient was treated with bowel rest and IV fluids.  When she arrived to the medical floor, the patient developed atrial fibrillation with RVR and respiratory distress.  She was transferred to the stepdown unit and started on diltiazem drip and placed on BiPAP.  Subsequently, the patient became hypotensive requiring fluid resuscitation.  Repeat chest x-ray show worsening bilateral perihilar densities with stable bibasilar opacifications.  Lactic acid peaked at 4.4.  The patient was started on ceftriaxone and metronidazole.  Assessment/Plan: Sepsis -Present at the time of admission -Secondary to aspiration pneumonia/CAP -Lactic acid peaked at 4.4 -Judicious IV fluids -Checked procalcitonin  5.86 -Follow blood culture--neg so far -Urinalysis negative -Continue current IV antibiotics and supportive care.  Aspiration Pneumonia/Lobar pneumonia -Continue meropenem and azithromycin. -Continue oxygen supplementation, with intention to wean off as tolerated -Continue supportive  care.  Acute respiratory failure with hypoxia -Secondary to pneumonia  -Reaching 36 hours without the need of BiPAP -Still requiring oxygen supplementation and with appreciated tachypnea on minimal exertion. -continue supportive care, IV antibiotics and follow clinical response. -Given presence of pleural effusion and decreased breath sounds at the bases, patient will be started on low-dose Lasix following cardiology recommendations.  Small bowel obstruction -General surgery consult appreciated -6/10--case discussed with Dr. Henreitta LeberBridges -Continue NG tube and low intermittent suction; overall with significant decrease in NG output. -Remain n.p.o. -Judicious IV fluids/electrolyte support. -Continue Protonix -Increase physical activity as tolerated.  Atrial fibrillation with RVR -Precipitated by her acute medical condition -Currently on diltiazem drip--which will be continue -Patient started on low-dose metoprolol IV -Overall improved rate control. -Echocardiogram--EF >65%, no WMA, mod MR -Patient was on apixaban prior to admission; currently on hold secondary to n.p.o. status.  Continue on IV heparin. -appreciate cardiology consult  Chronic diastolic CHF -daily weights -Accurate I's and O's -Low-dose Lasix recommended by cardiology service. -Follow volume status.  Acute on chronic renal failure: Stage III at baseline.   -Secondary to hemodynamic changes and hypotension. -Baseline creatinine~0.8-1.1 -monitor with diuresis -Creatinine peak of 1.3; down to 1.1 today. -Continue to monitor renal function trend.  Hyperglycemia -check A1C--5.3 -Elevated blood sugars in the setting of stress demargination.   Disposition Plan:   Remain in SDU Family Communication:   Son updated at bedside 6/9  Consultants:  General surgery; Cardiology  Code Status:  FULL  DVT Prophylaxis:  IV Heparin   Procedures: As Listed in Progress Note  Above  Antibiotics: Ceftriaxone/flagyl 6/7>>6/8 Merrem 6/8>>>   The patient is critically ill with multiple organ system failure requiring high complexity decision making for assessment and support, frequent evaluation and titration of  therapies, application of advance monitoring technologies and extensive interpretation of multiple databases.  Time: 35 minutes.   Subjective: Patient is breathing slightly easier today; he still requiring nasal cannula supplementation and demonstrating tachypnea/difficulty speaking in full sentences.  No chest pain, no abdominal pain, no nausea, no vomiting.  Reports passing gas x2 overnight.  Patient is afebrile.  Objective: Vitals:   03/28/19 0900 03/28/19 0930 03/28/19 1134 03/28/19 1617  BP: 112/62 (!) 114/53    Pulse: 67 65 (!) 119 99  Resp: (!) 39 (!) 27 (!) 32 (!) 43  Temp:   98 F (36.7 C) 98.9 F (37.2 C)  TempSrc:   Oral Oral  SpO2: 91%  93% (!) 88%  Weight:      Height:        Intake/Output Summary (Last 24 hours) at 03/28/2019 1848 Last data filed at 03/28/2019 1745 Gross per 24 hour  Intake 1891.73 ml  Output 700 ml  Net 1191.73 ml   Weight change: 0.5 kg  Exam: General exam: Alert, awake, oriented x 3; currently afebrile.  Still requiring oxygen supplementation and demonstrating mild difficulty speaking in full sentences.  Denies chest pain, no nausea, no vomiting.  Reports passing gas overnight.  NG tube in place. Respiratory system: Decreased breath sounds at the bases, positive rhonchi right, no using accessory muscles.  Positive tachypnea on examination Cardiovascular system: Irregular irregular; positive systolic ejection murmur, no rubs, no gallops, no JVD.   Gastrointestinal system: Abdomen is nondistended, soft and nontender. No organomegaly or masses felt.  No bowel sounds on exam. Central nervous system: Alert and oriented. No focal neurological deficits. Extremities: No C/C/E, +pedal pulses Skin: No rashes,  lesions or ulcers Psychiatry: Judgement and insight appear normal. Mood & affect appropriate.   Data Reviewed: I have personally reviewed following labs and imaging studies   Basic Metabolic Panel: Recent Labs  Lab 03/30/2019 1117 03/26/19 0418 03/27/19 0612 03/28/19 0425  NA 138 138 140 139  K 4.5 4.0 4.2 4.3  CL 95* 103 106 107  CO2 27 25 26 24   GLUCOSE 163* 200* 89 132*  BUN 20 29* 38* 44*  CREATININE 0.84 1.23* 1.32* 1.14*  CALCIUM 10.3 9.0 9.2 9.4  MG  --   --  2.0  --   PHOS  --   --  3.1  --    Liver Function Tests: Recent Labs  Lab March 30, 2019 1117 03/27/19 0612 03/28/19 0425  AST 25 18 16   ALT 25 17 16   ALKPHOS 67 40 53  BILITOT 1.5* 1.0 1.2  PROT 7.5 5.3* 5.2*  ALBUMIN 4.0 2.5* 2.4*   Recent Labs  Lab 30-Mar-2019 1117  LIPASE 46   CBC: Recent Labs  Lab March 30, 2019 1117 03/26/19 0418 03/27/19 0612 03/28/19 0425  WBC 12.6* 13.9* 15.5* 18.1*  NEUTROABS 11.2*  --   --   --   HGB 16.6* 12.3 11.4* 11.3*  HCT 53.1* 38.8 35.9* 36.9  MCV 96.2 95.8 95.2 97.6  PLT 304 237 191 191   Cardiac Enzymes: Recent Labs  Lab 03-30-2019 1117  TROPONINI 0.03*   CBG: Recent Labs  Lab 03/26/19 1609 03/26/19 1938 03/27/19 0010 03/27/19 0444 03/27/19 0747  GLUCAP 88 98 93 75 75   HbA1C: Recent Labs    03/26/19 0830  HGBA1C 5.3   Urine analysis:    Component Value Date/Time   COLORURINE AMBER (A) 03/30/2019 1158   APPEARANCEUR HAZY (A) March 30, 2019 1158   LABSPEC 1.027 2019-03-30 1158   PHURINE 5.0  04/06/2019 1158   GLUCOSEU NEGATIVE 03/24/2019 1158   HGBUR NEGATIVE 03/31/2019 1158   BILIRUBINUR NEGATIVE 03/23/2019 1158   KETONESUR 20 (A) 03/24/2019 1158   PROTEINUR 100 (A) 03/21/2019 1158   UROBILINOGEN 0.2 07/24/2014 1534   NITRITE NEGATIVE 04/15/2019 1158   LEUKOCYTESUR NEGATIVE 03/24/2019 1158    Recent Results (from the past 240 hour(s))  SARS Coronavirus 2 (CEPHEID - Performed in [facility] hospital lab), Hosp Order     Status: None   Collection  Time: 03/21/2019  2:26 PM  Result Value Ref Range Status   SARS Coronavirus 2 NEGATIVE NEGATIVE Final    Comment: (NOTE) If result is NEGATIVE SARS-CoV-2 target nucleic acids are NOT DETECTED. The SARS-CoV-2 RNA is generally detectable in upper and lower  respiratory specimens during the acute phase of infection. The lowest  concentration of SARS-CoV-2 viral copies this assay can detect is 250  copies / mL. A negative result does not preclude SARS-CoV-2 infection  and should not be used as the sole basis for treatment or other  patient management decisions.  A negative result may occur with  improper specimen collection / handling, submission of specimen other  than nasopharyngeal swab, presence of viral mutation(s) within the  areas targeted by this assay, and inadequate number of viral copies  (<250 copies / mL). A negative result must be combined with clinical  observations, patient history, and epidemiological information. If result is POSITIVE SARS-CoV-2 target nucleic acids are DETECTED. The SARS-CoV-2 RNA is generally detectable in upper and lower  respiratory specimens dur ing the acute phase of infection.  Positive  results are indicative of active infection with SARS-CoV-2.  Clinical  correlation with patient history and other diagnostic information is  necessary to determine patient infection status.  Positive results do  not rule out bacterial infection or co-infection with other viruses. If result is PRESUMPTIVE POSTIVE SARS-CoV-2 nucleic acids MAY BE PRESENT.   A presumptive positive result was obtained on the submitted specimen  and confirmed on repeat testing.  While 2019 novel coronavirus  (SARS-CoV-2) nucleic acids may be present in the submitted sample  additional confirmatory testing may be necessary for epidemiological  and / or clinical management purposes  to differentiate between  SARS-CoV-2 and other Sarbecovirus currently known to infect humans.  If  clinically indicated additional testing with an alternate test  methodology [phone]) is advised. The SARS-CoV-2 RNA is generally  detectable in upper and lower respiratory sp ecimens during the acute  phase of infection. The expected result is Negative. Fact Sheet for Patients:  BoilerBrush.com.cy Fact Sheet for Healthcare Providers: https://pope.com/ This test is not yet approved or cleared by the [place] FDA and has been authorized for detection and/or diagnosis of SARS-CoV-2 by FDA under an Emergency Use Authorization (EUA).  This EUA will remain in effect (meaning this test can be used) for the duration of the COVID-19 declaration under Section 564(b)(1) of the Act, 21 U.S.C. section 360bbb-3(b)(1), unless the authorization is terminated or revoked sooner. Performed at [facility], [address] [place], [place] [address]   MRSA PCR Screening     Status: None   Collection Time: 03/30/2019  7:54 PM  Result Value Ref Range Status   MRSA by PCR NEGATIVE NEGATIVE Final    Comment:        The GeneXpert MRSA Assay (FDA approved for NASAL specimens only), is one component of a comprehensive MRSA colonization surveillance program. It is not intended to diagnose MRSA infection nor  to guide or monitor treatment for MRSA infections. Performed at [facility], [address] [place], [place] [address]   Culture, blood (Routine X 2) w Reflex to ID Panel     Status: None (Preliminary result)   Collection Time: 04/13/2019  9:50 PM  Result Value Ref Range Status   Specimen Description LEFT ANTECUBITAL  Final   Special Requests   Final    BOTTLES DRAWN AEROBIC AND ANAEROBIC Blood Culture adequate volume   Culture   Final    NO GROWTH 3 DAYS Performed at [facility], [address] [place], [place] [address]    Report Status PENDING  Incomplete     Scheduled Meds:  bisacodyl  10 mg Rectal Daily   chlorhexidine  15 mL Mouth  Rinse BID   Chlorhexidine Gluconate Cloth  6 each Topical Daily   furosemide  20 mg Intravenous Daily   mouth rinse  15 mL Mouth Rinse q12n4p   metoprolol tartrate  2.5 mg Intravenous Q8H   pantoprazole (PROTONIX) IV  40 mg Intravenous Q24H   sodium chloride flush  10-40 mL Intracatheter Q12H   sodium chloride flush  3 mL Intravenous Q12H   Continuous Infusions:  sodium chloride 250 mL (03/26/19 0934)   azithromycin 500 mg (03/28/19 1016)   dextrose 5% lactated ringers with KCl 20 mEq/L 75 mL/hr at 03/28/19 1445   diltiazem (CARDIZEM) infusion 12.5 mg/hr (03/28/19 1447)   heparin 500 Units/hr (03/28/19 1449)   meropenem (MERREM) IV 200 mL/hr at 03/28/19 0900    Procedures/Studies: Ct Chest Wo Contrast  Result Date: 03/27/2019 CLINICAL DATA:  Acute respiratory illness.  Hypoxia. EXAM: CT CHEST WITHOUT CONTRAST TECHNIQUE: Multidetector CT imaging of the chest was performed following the standard protocol without IV contrast. COMPARISON:  None FINDINGS: Cardiovascular: Moderate cardiac enlargement and small pericardial effusion. Aortic atherosclerosis. Calcification in the left main, left circumflex coronary arteries identified. Mediastinum/Nodes: Normal appearance of the thyroid gland. The trachea appears patent and is midline. Large hiatal hernia. Nasogastric tube is identified. The tube forms a loop within the hiatal hernia and then continues into the intra-abdominal stomach. Right paratracheal lymph node measures 1.5 cm, image 53/4. Left paratracheal lymph node measures 1.6 cm, image 43/4. Subcarinal lymph node measures 2.1 cm. Hilar structures are suboptimally evaluated due to lack of IV contrast material. No axillary or supraclavicular adenopathy. Lungs/Pleura: Small bilateral pleural effusions identified. The left pleural effusion appears mildly loculated. There is subsegmental scratch set partial consolidation in atelectasis of the right lower lobe noted. There is complete  airspace consolidation of the entire left lower lobe. Partial consolidation in atelectasis of the lingula. Patchy ground-glass and airspace densities noted within the right middle lobe and central left upper lobe. Upper Abdomen: No acute abnormality. Musculoskeletal: Bones appear osteopenic. Kyphoscoliosis deformity of the thoracic spine noted. IMPRESSION: 1. Small to moderate bilateral pleural effusions. Multifocal, bilateral airspace consolidation identified compatible with pneumonia. The left lower lobe is completely consolidated/collapsed. There is partial consolidation/collapse of the right lower lobe and lingula. Patchy ground-glass and airspace densities are noted within the right middle lobe and both upper lobes. 2. Enlarged mediastinal lymph nodes. In the setting of CHF and pneumonia these are nonspecific and may be reactive. Follow-up imaging with CT of the chest in 3 months is recommended. This recommendation follows ACR consensus guidelines: Managing Incidental Findings on Thoracic CT: Mediastinal and Cardiovascular Findings. A White Paper of the ACR Incidental Findings Committee. J Am Coll Radiol. 2018; 15: 0981-1914: 1087-1096. 3. Cardiac enlargement and  small pericardial effusion 4. Aortic Atherosclerosis (ICD10-I70.0). Coronary artery calcifications. Electronically Signed   By: Signa Kell M.D.   On: 03/27/2019 15:36   Ct Abdomen Pelvis W Contrast  Result Date: 04/12/2019 CLINICAL DATA:  Constipation since Thursday. Vomiting. Hysterectomy. Appendectomy. High-grade bowel obstruction. EXAM: CT ABDOMEN AND PELVIS WITH CONTRAST TECHNIQUE: Multidetector CT imaging of the abdomen and pelvis was performed using the standard protocol following bolus administration of intravenous contrast. CONTRAST:  OMNIPAQUE IOHEXOL 300 MG/ML  SOLN COMPARISON:  None. FINDINGS: Lower chest: Right base atelectasis. Left base collapse/consolidative change. Moderate cardiomegaly with small pericardial effusion. Small bilateral  pleural effusions. A large hiatal hernia, with approximately 1/2 of the stomach positioned in the lower chest. Hepatobiliary: Anterior left hepatic lobe cyst. Cholecystectomy, without biliary ductal dilatation. Pancreas: Normal, without mass or ductal dilatation. Spleen: Normal in size, without focal abnormality. Adrenals/Urinary Tract: Normal adrenal glands. Bilateral renal low-density lesions which are likely cysts and minimally complex cysts. Other renal lesions are too small to characterize. No hydronephrosis. Normal urinary bladder. Stomach/Bowel: The stomach is distended and fluid-filled. The left side of the colon is relatively decompressed with scattered diverticula. Proximal and mid small bowel loops are dilated including at up to 2.9 cm. This continues to the level of a transition point within the central pelvis, likely in the proximal ileum on image 55/3 and coronal image 41. No obstructive mass identified. No complicating ischemia. Vascular/Lymphatic: Aortic and branch vessel atherosclerosis. No abdominopelvic adenopathy. Reproductive: Hysterectomy.  No adnexal mass. Other: Mild pelvic floor laxity.  No free intraperitoneal air. Musculoskeletal: Lumbosacral spondylosis with moderate convex left lumbar spine curvature. IMPRESSION: 1. High-grade partial small-bowel obstruction, to the level of the mid small bowel. Most likely related to adhesions. No complicating ischemia. 2. Distended stomach with large hiatal hernia. The patient may benefit from nasogastric tube placement. 3. Small bilateral pleural effusions with left worse than right base airspace disease. On the right, likely atelectasis. On the left, infection or aspiration cannot be excluded. 4. Cardiomegaly with small pericardial effusion. 5.  Aortic Atherosclerosis (ICD10-I70.0). Electronically Signed   By: Jeronimo Greaves M.D.   On: 04/03/2019 14:02   Dg Chest Port 1 View  Result Date: 03/27/2019 CLINICAL DATA:  Acute respiratory failure with  hypoxia. EXAM: PORTABLE CHEST 1 VIEW COMPARISON:  One-view chest x-ray 03/26/2019 FINDINGS: Heart is enlarged. Left hemidiaphragm is elevated. NG tube is in the stomach. Left-sided PICC line terminates in the right atrium. Pulmonary vascular congestion is increased. Bilateral infiltrates have increased. IMPRESSION: 1. Increasing bilateral infiltrates, left greater than right. This is concerning for infection. 2. Elevation of left hemidiaphragm. 3. Stable cardiomegaly. 4. Left-sided PICC line terminates in the right atrium. Electronically Signed   By: Marin Roberts M.D.   On: 03/27/2019 08:37   Dg Chest Port 1 View  Result Date: 03/26/2019 CLINICAL DATA:  Follow-up PICC line placement EXAM: PORTABLE CHEST 1 VIEW COMPARISON:  04/15/2019 FINDINGS: Left-sided PICC line is noted with catheter tip at the cavoatrial junction. Gastric catheter is noted coiled within the stomach. Elevation of the left hemidiaphragm is noted with some associated infiltrates bilaterally left greater than right similar to that seen on the prior exam. No bony abnormality is noted. IMPRESSION: Stable infiltrates bilaterally left greater than right. New left-sided PICC line in satisfactory position. Electronically Signed   By: Alcide Clever M.D.   On: 03/26/2019 12:46   Dg Chest Port 1 View  Result Date: 03/21/2019 CLINICAL DATA:  NG tube placement. EXAM: PORTABLE CHEST 1  VIEW COMPARISON:  April 26, 2019 and CT April 26, 2019 FINDINGS: Patient is slightly rotated to the left. Nasogastric tube is coiled once over the stomach in patient's known large hiatal hernia and then proceeds inferiorly over the left upper quadrant and off the film as tip is not visualized. Non radiopaque side-port is over the stomach in the left upper abdomen. Exam demonstrates adequate lung volumes with worsening bilateral perihilar opacification suggesting interstitial edema and less likely infection. Persistent opacification over the left base likely effusion with  atelectasis. Stable mild hazy density over the right base which may represent a small effusion with atelectasis. Infection over the lung bases is possible. Stable cardiomegaly. Remainder of the exam is unchanged. IMPRESSION: Worsening bilateral perihilar opacification suggesting worsening interstitial edema. Stable bibasilar opacification likely small effusions left greater than right with associated basilar atelectasis. Infection in the mid to lower lungs is possible. Stable cardiomegaly. Nasogastric tube coiled once over the large hiatal hernia with side-port over the stomach in the left upper quadrant and tip not visualized. Electronically Signed   By: Elberta Fortisaniel  Boyle M.D.   On: April 26, 2019 20:28   Dg Chest Portable 1 View  Result Date: 04/08/2019 CLINICAL DATA:  Nasogastric tube placement EXAM: PORTABLE CHEST 1 VIEW COMPARISON:  Abdominal CT from earlier today FINDINGS: Nasogastric tube which at least reaches the stomach (which is low lying and distended by CT). There is haziness at both lung bases where there is atelectasis and pleural fluid by prior CT. Cardiopericardial enlargement. IMPRESSION: 1. The nasogastric tube reaches the stomach at least. 2. Cardiomegaly with bilateral pleural effusion obscuring the lower lobes. Electronically Signed   By: Marnee SpringJonathon  Watts M.D.   On: April 26, 2019 16:36   Dg Abd Portable 2v  Result Date: 03/26/2019 CLINICAL DATA:  Small bowel obstruction, colon resection. EXAM: PORTABLE ABDOMEN - 2 VIEW COMPARISON:  CT abdomen pelvis April 26, 2019. FINDINGS: Nasogastric tube terminates in the stomach. Persistent small bowel dilatation. No minimal colonic gas. Retained contrast in the bladder with Foley catheter in place. Postoperative changes in the left lower quadrant. Incidental imaging of the lower chest shows left lower lobe consolidation with patchy airspace opacification bilaterally. Bilateral pleural effusions are noted as well. IMPRESSION: 1. Persistent small bowel obstruction.  2. Left lower lobe consolidation with patchy bilateral airspace opacification and bilateral pleural effusions, better evaluated on chest radiograph done the same day. Electronically Signed   By: Leanna BattlesMelinda  Blietz M.D.   On: 03/26/2019 12:46    Vassie Lollarlos Eldor Conaway, MD  [facility] Pager [phone]   03/28/2019, 6:48 PM   LOS: 3 days

## 2019-03-28 NOTE — Progress Notes (Addendum)
Progress Note  Patient Name: Carrie AltesKatherine Bathgate Date of Encounter: 03/28/2019  Primary Cardiologist: Laural Goldenavid Kotlaba, MD [facility]  Subjective   States that she feels better, did pass some flatus.  No abdominal pain, nausea, or emesis.  Still has NG tube for bowel decompression.  Inpatient Medications    Scheduled Meds:  chlorhexidine  15 mL Mouth Rinse BID   Chlorhexidine Gluconate Cloth  6 each Topical Daily   mouth rinse  15 mL Mouth Rinse q12n4p   metoprolol tartrate  2.5 mg Intravenous Q8H   pantoprazole (PROTONIX) IV  40 mg Intravenous Q24H   sodium chloride flush  10-40 mL Intracatheter Q12H   sodium chloride flush  3 mL Intravenous Q12H   Continuous Infusions:  sodium chloride 250 mL (03/26/19 0934)   azithromycin Stopped (03/27/19 1203)   dextrose 5% lactated ringers with KCl 20 mEq/L 75 mL/hr at 03/28/19 0300   diltiazem (CARDIZEM) infusion 12.5 mg/hr (03/28/19 0724)   heparin 550 Units/hr (03/28/19 0300)   meropenem (MERREM) IV 1,000 mg (03/28/19 0831)   PRN Meds: sodium chloride, acetaminophen **OR** acetaminophen, albuterol, ondansetron **OR** ondansetron (ZOFRAN) IV, phenol, polyethylene glycol, sodium chloride flush, sodium chloride flush, traZODone   Vital Signs    Vitals:   03/28/19 0630 03/28/19 0700 03/28/19 0742 03/28/19 0800  BP: (!) 111/45 114/68  (!) 117/48  Pulse: 90 (!) 157 (!) 143 92  Resp: (!) 34 (!) 35 (!) 37 (!) 38  Temp:   98.7 F (37.1 C)   TempSrc:   Oral   SpO2: 93% 93% 91% 91%  Weight:      Height:        Intake/Output Summary (Last 24 hours) at 03/28/2019 0855 Last data filed at 03/28/2019 0655 Gross per 24 hour  Intake 2226.86 ml  Output 450 ml  Net 1776.86 ml   Filed Weights   03/26/19 0500 03/27/19 0500 03/28/19 0500  Weight: 48.3 kg 49 kg 49.5 kg    Telemetry    Atrial fibrillation.  Personally reviewed.  Physical Exam   GEN:  Frail-appearing elderly woman, no acute distress.  NG tube in  place. Neck: No JVD. Cardiac:  Irregularly irregular, 3/6 systolic murmur, no gallop.  Respiratory:  Decreased breath sounds with rhonchi, no wheezing. GI: Soft, nontender, bowel sounds present. MS: No edema; No deformity. Neuro:  Nonfocal. Psych: Alert and oriented x 3. Normal affect.  Labs    Chemistry Recent Labs  Lab 04/11/2019 1117 03/26/19 0418 03/27/19 0612 03/28/19 0425  NA 138 138 140 139  K 4.5 4.0 4.2 4.3  CL 95* 103 106 107  CO2 27 25 26 24   GLUCOSE 163* 200* 89 132*  BUN 20 29* 38* 44*  CREATININE 0.84 1.23* 1.32* 1.14*  CALCIUM 10.3 9.0 9.2 9.4  PROT 7.5  --  5.3* 5.2*  ALBUMIN 4.0  --  2.5* 2.4*  AST 25  --  18 16  ALT 25  --  17 16  ALKPHOS 67  --  40 53  BILITOT 1.5*  --  1.0 1.2  GFRNONAA >60 39* 36* 43*  GFRAA >60 45* 42* 50*  ANIONGAP 16* 10 8 8      Hematology Recent Labs  Lab 03/26/19 0418 03/27/19 0612 03/28/19 0425  WBC 13.9* 15.5* 18.1*  RBC 4.05 3.77* 3.78*  HGB 12.3 11.4* 11.3*  HCT 38.8 35.9* 36.9  MCV 95.8 95.2 97.6  MCH 30.4 30.2 29.9  MCHC 31.7 31.8 30.6  RDW 13.6 14.1 14.1  PLT 237 191  191    Cardiac Enzymes Recent Labs  Lab 03/21/2019 1117  TROPONINI 0.03*   No results for input(s): TROPIPOC in the last 168 hours.   BNP Recent Labs  Lab 03/26/19 0830  BNP 950.0*     Radiology    Ct Chest Wo Contrast  Result Date: 03/27/2019 CLINICAL DATA:  Acute respiratory illness.  Hypoxia. EXAM: CT CHEST WITHOUT CONTRAST TECHNIQUE: Multidetector CT imaging of the chest was performed following the standard protocol without IV contrast. COMPARISON:  None FINDINGS: Cardiovascular: Moderate cardiac enlargement and small pericardial effusion. Aortic atherosclerosis. Calcification in the left main, left circumflex coronary arteries identified. Mediastinum/Nodes: Normal appearance of the thyroid gland. The trachea appears patent and is midline. Large hiatal hernia. Nasogastric tube is identified. The tube forms a loop within the hiatal  hernia and then continues into the intra-abdominal stomach. Right paratracheal lymph node measures 1.5 cm, image 53/4. Left paratracheal lymph node measures 1.6 cm, image 43/4. Subcarinal lymph node measures 2.1 cm. Hilar structures are suboptimally evaluated due to lack of IV contrast material. No axillary or supraclavicular adenopathy. Lungs/Pleura: Small bilateral pleural effusions identified. The left pleural effusion appears mildly loculated. There is subsegmental scratch set partial consolidation in atelectasis of the right lower lobe noted. There is complete airspace consolidation of the entire left lower lobe. Partial consolidation in atelectasis of the lingula. Patchy ground-glass and airspace densities noted within the right middle lobe and central left upper lobe. Upper Abdomen: No acute abnormality. Musculoskeletal: Bones appear osteopenic. Kyphoscoliosis deformity of the thoracic spine noted. IMPRESSION: 1. Small to moderate bilateral pleural effusions. Multifocal, bilateral airspace consolidation identified compatible with pneumonia. The left lower lobe is completely consolidated/collapsed. There is partial consolidation/collapse of the right lower lobe and lingula. Patchy ground-glass and airspace densities are noted within the right middle lobe and both upper lobes. 2. Enlarged mediastinal lymph nodes. In the setting of CHF and pneumonia these are nonspecific and may be reactive. Follow-up imaging with CT of the chest in 3 months is recommended. This recommendation follows ACR consensus guidelines: Managing Incidental Findings on Thoracic CT: Mediastinal and Cardiovascular Findings. A White Paper of the ACR Incidental Findings Committee. J Am Coll Radiol. 2018; 15: 1610-9604: 1087-1096. 3. Cardiac enlargement and small pericardial effusion 4. Aortic Atherosclerosis (ICD10-I70.0). Coronary artery calcifications. Electronically Signed   By: Signa Kellaylor  Stroud M.D.   On: 03/27/2019 15:36   Dg Chest Port 1  View  Result Date: 03/27/2019 CLINICAL DATA:  Acute respiratory failure with hypoxia. EXAM: PORTABLE CHEST 1 VIEW COMPARISON:  One-view chest x-ray 03/26/2019 FINDINGS: Heart is enlarged. Left hemidiaphragm is elevated. NG tube is in the stomach. Left-sided PICC line terminates in the right atrium. Pulmonary vascular congestion is increased. Bilateral infiltrates have increased. IMPRESSION: 1. Increasing bilateral infiltrates, left greater than right. This is concerning for infection. 2. Elevation of left hemidiaphragm. 3. Stable cardiomegaly. 4. Left-sided PICC line terminates in the right atrium. Electronically Signed   By: Marin Robertshristopher  Mattern M.D.   On: 03/27/2019 08:37   Dg Chest Port 1 View  Result Date: 03/26/2019 CLINICAL DATA:  Follow-up PICC line placement EXAM: PORTABLE CHEST 1 VIEW COMPARISON:  03/24/2019 FINDINGS: Left-sided PICC line is noted with catheter tip at the cavoatrial junction. Gastric catheter is noted coiled within the stomach. Elevation of the left hemidiaphragm is noted with some associated infiltrates bilaterally left greater than right similar to that seen on the prior exam. No bony abnormality is noted. IMPRESSION: Stable infiltrates bilaterally left greater than right. New left-sided PICC  line in satisfactory position. Electronically Signed   By: Alcide CleverMark  Lukens M.D.   On: 03/26/2019 12:46   Dg Abd Portable 2v  Result Date: 03/26/2019 CLINICAL DATA:  Small bowel obstruction, colon resection. EXAM: PORTABLE ABDOMEN - 2 VIEW COMPARISON:  CT abdomen pelvis 2018/12/17. FINDINGS: Nasogastric tube terminates in the stomach. Persistent small bowel dilatation. No minimal colonic gas. Retained contrast in the bladder with Foley catheter in place. Postoperative changes in the left lower quadrant. Incidental imaging of the lower chest shows left lower lobe consolidation with patchy airspace opacification bilaterally. Bilateral pleural effusions are noted as well. IMPRESSION: 1. Persistent  small bowel obstruction. 2. Left lower lobe consolidation with patchy bilateral airspace opacification and bilateral pleural effusions, better evaluated on chest radiograph done the same day. Electronically Signed   By: Leanna BattlesMelinda  Blietz M.D.   On: 03/26/2019 12:46    Cardiac Studies   Echocardiogram 03/26/2019: 1. The left ventricle has hyperdynamic systolic function, with an ejection fraction of >65%. The cavity size was normal. There is moderate asymmetric left ventricular hypertrophy with sigmoid shaped septum. Left ventricular diastolic Doppler parameters  are indeterminate in the setting of atrial fibrillation. No evidence of left ventricular regional wall motion abnormalities. 2. The right ventricle has normal systolic function. The cavity was normal. There is no increase in right ventricular wall thickness. Right ventricular systolic pressure is mildly elevated with an estimated pressure of 40.1 mmHg. 3. Left atrial size was severely dilated. 4. Right atrial size was mildly dilated. 5. Moderate pericardial effusion. 6. The pericardial effusion is anterior to the right ventricle. 7. The mitral valve is grossly normal. Mild thickening of the mitral valve leaflet. There is mild mitral annular calcification present. Mitral valve regurgitation is moderate by color flow Doppler. The MR jet is posteriorly-directed. 8. The aortic valve is tricuspid. Mild calcification of the aortic valve. Mild aortic annular calcification noted. There is increased LVOT/transaortic velocity that reflects small LVOT with turbulent flow and mitral SAM, although not clearly aortic  stenosis. 9. The tricuspid valve is grossly normal. 10. The aortic root is normal in size and structure. 11. The inferior vena cava was normal in size with <50% respiratory variability.  Patient Profile     85 y.o. female with a history of permanent atrial fibrillation diagnosed in December 2019 (also intermittent atrial flutter  based on records) and hypertension currently admitted with small bowel obstruction and sepsis secondary to pneumonia.  Assessment & Plan    1.  Permanent atrial fibrillation and intermittent atrial flutter diagnosed in 2019.  She has had recent RVR in the setting of physiologic stress with comorbid illnesses including small bowel obstruction and pneumonia.  She is currently tolerating intravenous diltiazem and heart rate has been more recently in the 90 to low 110 range.  Eliquis is currently on hold, and she is on IV heparin.  2.  Heart murmur secondary to hyperdynamic LVEF with small LVOT and turbulent flow associated with anterior systolic motion of the mitral valve.  Aortic valve is sclerotic but not stenotic.  She also has moderate mitral regurgitation.  3.  Small bowel obstruction, continues with bowel decompression, NG tube in place.  Clinically improving.  She has been seen by General Surgery.  4.  Sepsis with pneumonia, continues on antibiotics and is afebrile.  The from yesterday showed small to moderate bilateral pleural effusions with multifocal bilateral airspace consolidation compatible with pneumonia.  Also probable reactive mediastinal lymph nodes.  5.  Incidentally noted coronary and aortic  calcifications by CT imaging.  Blood pressure and heart rate are generally better today under the circumstances.  She is on Lopressor 2.5 mg IV every 8 hours along with IV diltiazem, tolerating both.  She also continues on heparin.  Hopefully as small bowel obstruction improves and she is able to take p.o.'s again, we can get her back on oral Lopressor.  Plan to start her on Lasix 20 mg IV daily, she was on this as an outpatient and with associated pleural effusions we will try to get a better handle on her volume status.  Obviously, need to avoid overdiuresis given her relatively tenuous status.  Signed, Rozann Lesches, MD  03/28/2019, 8:55 AM

## 2019-03-28 NOTE — Care Management Important Message (Signed)
Important Message  Patient Details  Name: Carrie Morales MRN: [id number] Date of Birth: 09/23/1928   Medicare Important Message Given:  Yes    Tommy Medal 03/28/2019, 1:03 PM

## 2019-03-28 NOTE — Progress Notes (Signed)
ANTICOAGULATION CONSULT NOTE - Initial Consult  Pharmacy Consult for lovenox--> heparin Indication: atrial fibrillation  Allergies  Allergen Reactions  . Codeine   . Penicillins   . Sulfa Antibiotics     Patient Measurements: Height: 5\' 4"  (162.6 cm) Weight: 109 lb 2 oz (49.5 kg) IBW/kg (Calculated) : 54.7  HEPARIN DW (KG): 47.6  Vital Signs: Temp: 98.7 F (37.1 C) (06/10 0742) Temp Source: Oral (06/10 0742) BP: 114/68 (06/10 0700) Pulse Rate: 143 (06/10 0742)  Labs: Recent Labs    04/09/2019 1117 03/26/19 0418 03/26/19 0956 03/27/19 0612 03/28/19 0425  HGB 16.6* 12.3  --  11.4* 11.3*  HCT 53.1* 38.8  --  35.9* 36.9  PLT 304 237  --  191 191  APTT  --   --  35 45*  --   HEPARINUNFRC  --   --  1.16* 0.70 0.68  CREATININE 0.84 1.23*  --  1.32* 1.14*  TROPONINI 0.03*  --   --   --   --     Estimated Creatinine Clearance: 26.7 mL/min (A) (by C-G formula based on SCr of 1.14 mg/dL (H)).   Assessment: Pharmacy consulted to dose heparin(was on lovenox) for patient with atrial fibrillation. Renal function worsened so switching to heparin.  Patient was on apixaban prior to admission with last dose given 6/7 AM, last dose of lovenox given 6/7 at 2100. Will order APTT and HL baseline. Monitor with APTT levels to adjust dosing d/t xarelto interference, until APTT and HL correlate Will start heparin at 2100(24 hours after last lovenox dose).    6/10 AM UPDATE:  heparin level is 0.68 IU/mL Will decrease heparin drip by another 50 units/hr     Goal of Therapy:   Heparin level 0.3-0.7 Monitor platelets by anticoagulation protocol: Yes    Plan:  Reduce  heparin infusion to 500 units/hr  Monitor platelet count closely for further decrease to 50 % of baseline Check   anti-Xa level   daily while on heparin Continue to monitor H&H and platelets   Despina Pole, Pharm. D. Clinical Pharmacist 03/28/2019 8:12 AM

## 2019-03-28 NOTE — Progress Notes (Signed)
Patient given bisacodyl suppository, patient up to bedside commode.  Patient had medium amount of formed dark brown stool.

## 2019-03-28 NOTE — Progress Notes (Signed)
[facility] Progress Note     Subjective: No major issues. Having some flatus. Minimal NG output.  Feeling ok otherwise. No pain.   Objective: Vital signs in last 24 hours: Temp:  [97.6 F (36.4 C)-98.9 F (37.2 C)] 98.9 F (37.2 C) (06/10 1617) Pulse Rate:  [48-163] 99 (06/10 1617) Resp:  [14-46] 43 (06/10 1617) BP: (94-123)/(39-71) 114/53 (06/10 0930) SpO2:  [88 %-98 %] 88 % (06/10 1617) Weight:  [49.5 kg] 49.5 kg (06/10 0500) Last BM Date: 03/21/19  Intake/Output from previous day: 06/09 0701 - 06/10 0700 In: 2226.9 [I.V.:1526.8; IV Piggyback:700] Out: 450 [Urine:400; Emesis/NG output:50] Intake/Output this shift: Total I/O In: 614.2 [I.V.:518.7; IV Piggyback:95.5] Out: -   General appearance: alert, cooperative and no distress Resp: normal work of breathing GI: soft, minimally distended, nontender  Lab Results:  Recent Labs    03/27/19 0612 03/28/19 0425  WBC 15.5* 18.1*  HGB 11.4* 11.3*  HCT 35.9* 36.9  PLT 191 191   BMET Recent Labs    03/27/19 0612 03/28/19 0425  NA 140 139  K 4.2 4.3  CL 106 107  CO2 26 24  GLUCOSE 89 132*  BUN 38* 44*  CREATININE 1.32* 1.14*  CALCIUM 9.2 9.4    Studies/Results: Ct Chest Wo Contrast  Result Date: 03/27/2019 CLINICAL DATA:  Acute respiratory illness.  Hypoxia. EXAM: CT CHEST WITHOUT CONTRAST TECHNIQUE: Multidetector CT imaging of the chest was performed following the standard protocol without IV contrast. COMPARISON:  None FINDINGS: Cardiovascular: Moderate cardiac enlargement and small pericardial effusion. Aortic atherosclerosis. Calcification in the left main, left circumflex coronary arteries identified. Mediastinum/Nodes: Normal appearance of the thyroid gland. The trachea appears patent and is midline. Large hiatal hernia. Nasogastric tube is identified. The tube forms a loop within the hiatal hernia and then continues into the intra-abdominal stomach. Right paratracheal lymph node measures  1.5 cm, image 53/4. Left paratracheal lymph node measures 1.6 cm, image 43/4. Subcarinal lymph node measures 2.1 cm. Hilar structures are suboptimally evaluated due to lack of IV contrast material. No axillary or supraclavicular adenopathy. Lungs/Pleura: Small bilateral pleural effusions identified. The left pleural effusion appears mildly loculated. There is subsegmental scratch set partial consolidation in atelectasis of the right lower lobe noted. There is complete airspace consolidation of the entire left lower lobe. Partial consolidation in atelectasis of the lingula. Patchy ground-glass and airspace densities noted within the right middle lobe and central left upper lobe. Upper Abdomen: No acute abnormality. Musculoskeletal: Bones appear osteopenic. Kyphoscoliosis deformity of the thoracic spine noted. IMPRESSION: 1. Small to moderate bilateral pleural effusions. Multifocal, bilateral airspace consolidation identified compatible with pneumonia. The left lower lobe is completely consolidated/collapsed. There is partial consolidation/collapse of the right lower lobe and lingula. Patchy ground-glass and airspace densities are noted within the right middle lobe and both upper lobes. 2. Enlarged mediastinal lymph nodes. In the setting of CHF and pneumonia these are nonspecific and may be reactive. Follow-up imaging with CT of the chest in 3 months is recommended. This recommendation follows ACR consensus guidelines: Managing Incidental Findings on Thoracic CT: Mediastinal and Cardiovascular Findings. A White Paper of the ACR Incidental Findings Committee. J Am Coll Radiol. 2018; 15: 1610-9604: 1087-1096. 3. Cardiac enlargement and small pericardial effusion 4. Aortic Atherosclerosis (ICD10-I70.0). Coronary artery calcifications. Electronically Signed   By: Signa Kellaylor  Stroud M.D.   On: 03/27/2019 15:36   Dg Chest Port 1 View  Result Date: 03/27/2019 CLINICAL DATA:  Acute respiratory failure with hypoxia. EXAM: PORTABLE CHEST 1  VIEW  COMPARISON:  One-view chest x-ray 03/26/2019 FINDINGS: Heart is enlarged. Left hemidiaphragm is elevated. NG tube is in the stomach. Left-sided PICC line terminates in the right atrium. Pulmonary vascular congestion is increased. Bilateral infiltrates have increased. IMPRESSION: 1. Increasing bilateral infiltrates, left greater than right. This is concerning for infection. 2. Elevation of left hemidiaphragm. 3. Stable cardiomegaly. 4. Left-sided PICC line terminates in the right atrium. Electronically Signed   By: San Morelle M.D.   On: 03/27/2019 08:37    Anti-infectives: Anti-infectives (From admission, onward)   Start     Dose/Rate Route Frequency Ordered Stop   03/27/19 1045  azithromycin (ZITHROMAX) 500 mg in sodium chloride 0.9 % 250 mL IVPB     500 mg 250 mL/hr over 60 Minutes Intravenous Every 24 hours 03/27/19 1038     03/26/19 0900  meropenem (MERREM) 1,000 mg in sodium chloride 0.9 % 100 mL IVPB     1,000 mg 200 mL/hr over 30 Minutes Intravenous Every 12 hours 03/26/19 0814     04/09/2019 2200  cefTRIAXone (ROCEPHIN) 2 g in sodium chloride 0.9 % 100 mL IVPB  Status:  Discontinued     2 g 200 mL/hr over 30 Minutes Intravenous Every 24 hours 03/19/2019 2044 03/26/19 0814   04/13/2019 2100  metroNIDAZOLE (FLAGYL) IVPB 500 mg  Status:  Discontinued     500 mg 100 mL/hr over 60 Minutes Intravenous Every 8 hours 03/20/2019 2044 03/26/19 0814      Assessment/Plan: Carrie Morales is a 85 yo with a resolving SBO. Having some flatus but no BM. We discussed option of pulling NG but need to possibly replace. She wants to keep for now. She feels like she could have a BM if she got to a commode.   -Suppository and up to the commode at bedside  -NG and NPO with ice for now -Continuing nonop management as long as possible due to the risk of surgery in patient with PNA, A fib with RVR recently,  CHF, etc   LOS: 3 days    Virl Cagey 03/28/2019

## 2019-03-29 DIAGNOSIS — J181 Lobar pneumonia, unspecified organism: Secondary | ICD-10-CM

## 2019-03-29 LAB — CBC
HCT: 38.8 % (ref 36.0–46.0)
Hemoglobin: 11.8 g/dL — ABNORMAL LOW (ref 12.0–15.0)
MCH: 29.9 pg (ref 26.0–34.0)
MCHC: 30.4 g/dL (ref 30.0–36.0)
MCV: 98.5 fL (ref 80.0–100.0)
Platelets: 223 10*3/uL (ref 150–400)
RBC: 3.94 MIL/uL (ref 3.87–5.11)
RDW: 14.2 % (ref 11.5–15.5)
WBC: 18 10*3/uL — ABNORMAL HIGH (ref 4.0–10.5)
nRBC: 0 % (ref 0.0–0.2)

## 2019-03-29 LAB — BASIC METABOLIC PANEL
Anion gap: 4 — ABNORMAL LOW (ref 5–15)
BUN: 39 mg/dL — ABNORMAL HIGH (ref 8–23)
CO2: 29 mmol/L (ref 22–32)
Calcium: 9.8 mg/dL (ref 8.9–10.3)
Chloride: 108 mmol/L (ref 98–111)
Creatinine, Ser: 0.79 mg/dL (ref 0.44–1.00)
GFR calc Af Amer: >60 mL/min (ref >60)
GFR calc non Af Amer: >60 mL/min (ref >60)
Glucose, Bld: 141 mg/dL — ABNORMAL HIGH (ref 70–99)
Potassium: 4.6 mmol/L (ref 3.5–5.1)
Sodium: 141 mmol/L (ref 135–145)

## 2019-03-29 LAB — LEGIONELLA PNEUMOPHILA SEROGP 1 UR AG: L. pneumophila Serogp 1 Ur Ag: NEGATIVE

## 2019-03-29 LAB — MAGNESIUM: Magnesium: 2.3 mg/dL (ref 1.7–2.4)

## 2019-03-29 LAB — HEPARIN LEVEL (UNFRACTIONATED): Heparin Unfractionated: 0.36 IU/mL (ref 0.30–0.70)

## 2019-03-29 NOTE — Plan of Care (Signed)
  Problem: Acute Rehab PT Goals(only PT should resolve) Goal: Pt Will Go Supine/Side To Sit Outcome: Progressing Flowsheets (Taken 03/29/2019 1505) Pt will go Supine/Side to Sit: with supervision Goal: Patient Will Transfer Sit To/From Stand Outcome: Progressing Irena (Taken 03/29/2019 1505) Patient will transfer sit to/from stand: with min guard assist Goal: Pt Will Transfer Bed To Chair/Chair To Bed Outcome: Progressing Flowsheets (Taken 03/29/2019 1505) Pt will Transfer Bed to Chair/Chair to Bed: min guard assist Goal: Pt Will Ambulate Outcome: Progressing Flowsheets (Taken 03/29/2019 1505) Pt will Ambulate:  50 feet  with min guard assist  with rolling walker  with cane   3:06 PM, 03/29/19 Lonell Grandchild, MPT Physical Therapist with [facility] [phone] office [phone] mobile phone

## 2019-03-29 NOTE — TOC Initial Note (Signed)
Transition of Care [facility]) - Initial/Assessment Note    Patient Details  Name: Carrie Morales MRN: [id number] Date of Birth: 07-16-1928  Transition of Care [facility]) CM/SW Contact:    Aymee Fomby, Chauncey Reading, RN Phone Number: 03/29/2019, 2:59 PM  Clinical Narrative:    SBO. From home alone, son lives next door. 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  Discussed SNF recommendation with patient and son (via phone). Preference would be for patient to return home with home health services. Patient's husband had home health with [facility] and they would like home health with them as well.  Plan would be for patient to continue to work with PT and progress close to her baseline and return home with home health and son checking in on her often.  TOC to follow patient. (Left SNF list in room for review for son in case needed).    Expected Discharge Plan: [place] Barriers to Discharge: No Barriers Identified   Patient Goals and CMS Choice   CMS Medicare.gov Compare Post Acute Care list provided to:: Patient(and discussed with son-Stuart- via phone) Choice offered to / list presented to : Patient(and son)  Expected Discharge Plan and Services Expected Discharge Plan: [place]   Discharge Planning Services: CM Consult Post Acute Care Choice: [place] arrangements for the past 2 months: Single Family Home                     Prior Living Arrangements/Services Living arrangements for the past 2 months: Single Family Home Lives with:: Self Patient language and need for interpreter reviewed:: Yes Do you feel safe going back to the place where you live?: Yes      Need for Family Participation in Patient Care: Yes (Comment) Care giver support system in place?: Yes (comment)   Criminal Activity/Legal Involvement Pertinent to Current Situation/Hospitalization: No - Comment as needed  Activities of  Daily Living Home Assistive Devices/Equipment: Eyeglasses, Gilford Rile (specify type) ADL Screening (condition at time of admission) Patient's cognitive ability adequate to safely complete daily activities?: Yes Is the patient deaf or have difficulty hearing?: No Does the patient have difficulty seeing, even when wearing glasses/contacts?: No Does the patient have difficulty concentrating, remembering, or making decisions?: No Patient able to express need for assistance with ADLs?: Yes Does the patient have difficulty dressing or bathing?: No Independently performs ADLs?: Yes (appropriate for developmental age) Does the patient have difficulty walking or climbing stairs?: No Weakness of Legs: Both Weakness of Arms/Hands: None  Permission Sought/Granted Permission sought to share information with : Family Supports                Emotional Assessment Appearance:: Appears stated age Attitude/Demeanor/Rapport: Engaged   Orientation: : Oriented to Place, Oriented to Self, Oriented to  Time      Admission diagnosis:  SBO (small bowel obstruction) ([place]) [K56.609] Elevated troponin [R79.89] Atrial fibrillation with RVR ([place]) [I48.91] Partial intestinal obstruction, unspecified cause ([place]) [K56.600] Patient Active Problem List   Diagnosis Date Noted  . Lobar pneumonia ([place]) 03/27/2019  . Elevated troponin   . Acute respiratory failure with hypoxia ([place]) 03/26/2019  . Acute diastolic CHF (congestive heart failure) ([place]) 03/26/2019  . Atrial fibrillation with RVR ([place])   . High Grade SBO (small bowel obstruction)  04/12/2019  . Essential hypertension 04/02/2019  . Chronic a-fib 04/05/2019  . PNA (pneumonia)--Aspiration 04/08/2019   PCP:  Earney Mallet,  MD Pharmacy:   [facility] [place] - [address] [place] [address] Phone: [phone] Fax: [phone]  [facility] [place] [place], [place] - [address] [place] [address] Phone: [phone] Fax: [phone]     Social Determinants of Health (SDOH) Interventions    Readmission Risk Interventions No flowsheet data found.

## 2019-03-29 NOTE — Progress Notes (Signed)
Progress Note  Patient Name: Carrie AltesKatherine Hatchell Date of Encounter: 03/29/2019  Primary Cardiologist: Laural Goldenavid Kotlaba, MD [facility])  Subjective   No palpitations or shortness of breath at rest.  States that she had a formed bowel movement yesterday.  No abdominal pain or emesis.  NG tube remains in place.  Inpatient Medications    Scheduled Meds:  bisacodyl  10 mg Rectal Daily   Chlorhexidine Gluconate Cloth  6 each Topical Daily   furosemide  20 mg Intravenous Daily   mouth rinse  15 mL Mouth Rinse q12n4p   metoprolol tartrate  2.5 mg Intravenous Q8H   pantoprazole (PROTONIX) IV  40 mg Intravenous Q24H   sodium chloride flush  10-40 mL Intracatheter Q12H   sodium chloride flush  3 mL Intravenous Q12H   Continuous Infusions:  sodium chloride 250 mL (03/26/19 0934)   azithromycin Stopped (03/28/19 1116)   dextrose 5% lactated ringers with KCl 20 mEq/L 75 mL/hr at 03/29/19 0600   diltiazem (CARDIZEM) infusion 12.5 mg/hr (03/29/19 0600)   heparin 500 Units/hr (03/29/19 0600)   meropenem (MERREM) IV Stopped (03/28/19 2148)   PRN Meds: sodium chloride, acetaminophen **OR** acetaminophen, albuterol, ondansetron **OR** ondansetron (ZOFRAN) IV, phenol, polyethylene glycol, sodium chloride flush, sodium chloride flush, traZODone   Vital Signs    Vitals:   03/29/19 0630 03/29/19 0700 03/29/19 0730 03/29/19 0742  BP: 104/76 118/76 (!) 115/58   Pulse: 72 83 83 80  Resp: (!) 26 (!) 26 (!) 38 (!) 33  Temp:    98.5 F (36.9 C)  TempSrc:    Oral  SpO2: 90% 90% 90% 91%  Weight:      Height:        Intake/Output Summary (Last 24 hours) at 03/29/2019 0830 Last data filed at 03/29/2019 0600 Gross per 24 hour  Intake 2544.75 ml  Output 900 ml  Net 1644.75 ml   Filed Weights   03/27/19 0500 03/28/19 0500 03/29/19 0405  Weight: 49 kg 49.5 kg 52.4 kg    Telemetry    Atrial fibrillation.  Personally reviewed.  Physical Exam   GEN:  Frail-appearing elderly  woman, no acute distress.  NG tube in place. Neck: No JVD. Cardiac:  Irregularly irregular, 3/6 systolic murmur, no gallop.  Respiratory:  Decreased breath sounds with egophony lower lung zones with rhonchi midlevel, no wheezing. GI: Soft, nontender, bowel sounds present. MS: No edema; No deformity. Neuro:  Nonfocal. Psych: Alert and oriented x 3. Normal affect.  Labs    Chemistry Recent Labs  Lab 07-18-2019 1117  03/27/19 0612 03/28/19 0425 03/29/19 0453  NA 138   < > 140 139 141  K 4.5   < > 4.2 4.3 4.6  CL 95*   < > 106 107 108  CO2 27   < > 26 24 29   GLUCOSE 163*   < > 89 132* 141*  BUN 20   < > 38* 44* 39*  CREATININE 0.84   < > 1.32* 1.14* 0.79  CALCIUM 10.3   < > 9.2 9.4 9.8  PROT 7.5  --  5.3* 5.2*  --   ALBUMIN 4.0  --  2.5* 2.4*  --   AST 25  --  18 16  --   ALT 25  --  17 16  --   ALKPHOS 67  --  40 53  --   BILITOT 1.5*  --  1.0 1.2  --   GFRNONAA >60   < > 36* 43* >60  GFRAA >60   < > 42* 50* >60  ANIONGAP 16*   < > 8 8 4*   < > = values in this interval not displayed.     Hematology Recent Labs  Lab 03/27/19 0612 03/28/19 0425 03/29/19 0453  WBC 15.5* 18.1* 18.0*  RBC 3.77* 3.78* 3.94  HGB 11.4* 11.3* 11.8*  HCT 35.9* 36.9 38.8  MCV 95.2 97.6 98.5  MCH 30.2 29.9 29.9  MCHC 31.8 30.6 30.4  RDW 14.1 14.1 14.2  PLT 191 191 223    Cardiac Enzymes Recent Labs  Lab Jan 03, 2019 1117  TROPONINI 0.03*   No results for input(s): TROPIPOC in the last 168 hours.   BNP Recent Labs  Lab 03/26/19 0830  BNP 950.0*     Radiology    Ct Chest Wo Contrast  Result Date: 03/27/2019 CLINICAL DATA:  Acute respiratory illness.  Hypoxia. EXAM: CT CHEST WITHOUT CONTRAST TECHNIQUE: Multidetector CT imaging of the chest was performed following the standard protocol without IV contrast. COMPARISON:  None FINDINGS: Cardiovascular: Moderate cardiac enlargement and small pericardial effusion. Aortic atherosclerosis. Calcification in the left main, left circumflex  coronary arteries identified. Mediastinum/Nodes: Normal appearance of the thyroid gland. The trachea appears patent and is midline. Large hiatal hernia. Nasogastric tube is identified. The tube forms a loop within the hiatal hernia and then continues into the intra-abdominal stomach. Right paratracheal lymph node measures 1.5 cm, image 53/4. Left paratracheal lymph node measures 1.6 cm, image 43/4. Subcarinal lymph node measures 2.1 cm. Hilar structures are suboptimally evaluated due to lack of IV contrast material. No axillary or supraclavicular adenopathy. Lungs/Pleura: Small bilateral pleural effusions identified. The left pleural effusion appears mildly loculated. There is subsegmental scratch set partial consolidation in atelectasis of the right lower lobe noted. There is complete airspace consolidation of the entire left lower lobe. Partial consolidation in atelectasis of the lingula. Patchy ground-glass and airspace densities noted within the right middle lobe and central left upper lobe. Upper Abdomen: No acute abnormality. Musculoskeletal: Bones appear osteopenic. Kyphoscoliosis deformity of the thoracic spine noted. IMPRESSION: 1. Small to moderate bilateral pleural effusions. Multifocal, bilateral airspace consolidation identified compatible with pneumonia. The left lower lobe is completely consolidated/collapsed. There is partial consolidation/collapse of the right lower lobe and lingula. Patchy ground-glass and airspace densities are noted within the right middle lobe and both upper lobes. 2. Enlarged mediastinal lymph nodes. In the setting of CHF and pneumonia these are nonspecific and may be reactive. Follow-up imaging with CT of the chest in 3 months is recommended. This recommendation follows ACR consensus guidelines: Managing Incidental Findings on Thoracic CT: Mediastinal and Cardiovascular Findings. A White Paper of the ACR Incidental Findings Committee. J Am Coll Radiol. 2018; 15: 0865-7846: 1087-1096. 3.  Cardiac enlargement and small pericardial effusion 4. Aortic Atherosclerosis (ICD10-I70.0). Coronary artery calcifications. Electronically Signed   By: Signa Kellaylor  Stroud M.D.   On: 03/27/2019 15:36    Cardiac Studies   Echocardiogram 03/26/2019: 1. The left ventricle has hyperdynamic systolic function, with an ejection fraction of >65%. The cavity size was normal. There is moderate asymmetric left ventricular hypertrophy with sigmoid shaped septum. Left ventricular diastolic Doppler parameters  are indeterminate in the setting of atrial fibrillation. No evidence of left ventricular regional wall motion abnormalities. 2. The right ventricle has normal systolic function. The cavity was normal. There is no increase in right ventricular wall thickness. Right ventricular systolic pressure is mildly elevated with an estimated pressure of 40.1 mmHg. 3. Left atrial size was severely dilated.  4. Right atrial size was mildly dilated. 5. Moderate pericardial effusion. 6. The pericardial effusion is anterior to the right ventricle. 7. The mitral valve is grossly normal. Mild thickening of the mitral valve leaflet. There is mild mitral annular calcification present. Mitral valve regurgitation is moderate by color flow Doppler. The MR jet is posteriorly-directed. 8. The aortic valve is tricuspid. Mild calcification of the aortic valve. Mild aortic annular calcification noted. There is increased LVOT/transaortic velocity that reflects small LVOT with turbulent flow and mitral SAM, although not clearly aortic  stenosis. 9. The tricuspid valve is grossly normal. 10. The aortic root is normal in size and structure. 11. The inferior vena cava was normal in size with <50% respiratory variability.  Patient Profile     85 y.o. female with a history of permanent atrial fibrillation diagnosed in December 2019 (also intermittent atrial flutter based on records) andhypertension currently admitted with small bowel  obstruction and sepsis secondary to pneumonia.  Assessment & Plan    1.  Permanent atrial fibrillation and intermittent atrial flutter diagnosed in 2019, followed by Dr. Darral Dash in [place].  She has had recent RVR in the setting of physiologic stress including small bowel obstruction and pneumonia.  Heart rate is under much better control on combination of intravenous diltiazem and IV Lopressor.  Eliquis has been held and she is on IV heparin at this point.  2.  Heart murmur secondary to hyperdynamic LVEF with small LVOT and turbulent flow associated with anterior systolic motion of the mitral valve.  She has aortic valve sclerosis but not stenosis.  Moderate mitral regurgitation present as well.  3.  Small bowel obstruction, NG tube remains in place with bowel rest.  She has been improving clinically and had a formed bowel movement yesterday.  She is being followed by General Surgery.  4.  Sepsis with pneumonia, remains on antibiotics per primary team and is afebrile.  She had small to moderate bilateral pleural effusions in association with multifocal bilateral airspace consolidation by CT imaging.  Also probable reactive mediastinal lymph nodes.  5.  Incidentally noted coronary and aortic calcifications by CT imaging.  No major change in cardiac plan at this time, continue current heart rate control strategy which has been effective, she is tolerating both IV diltiazem and IV Lopressor.  Continue heparin while off Eliquis.  Lasix was started yesterday given pleural effusions, she was also on diuretic as an outpatient.  As she is able to return to p.o. status, we can assist with adjustment back to an oral heart rate control regimen.  Signed, Rozann Lesches, MD  03/29/2019, 8:30 AM

## 2019-03-29 NOTE — Evaluation (Signed)
Physical Therapy Evaluation Patient Details Name: Carrie Morales MRN: [id number] DOB: [birth year] Today's Date: 03/29/2019   History of Present Illness  Carrie Morales  is a 85 y.o. female with past medical history relevant for chronic A. fib on chronic anticoagulation with Eliquis, dCHF,hypertension and history of multiple laparotomies/abdominal surgeries including prior colon resection (30 yrs ago for colonic diverticular abscess), prior appendectomy, prior abdominal hysterectomy, prior ectopic pregnancy surgery with LSO, prior ovarian cyst surgery on the right who presents with concerns about constipation and abdominal pain and recurrent emesis--    Clinical Impression  Patient presents up in chair (assisted by nursing staff) and agreeable for therapy.  Patient demonstrates slow labored movement for sit to stands, transfers and limited for ambulation due to fatigue and poor standing balance requiring use of RW for safety.  Patient requested to go back to bed after therapy due to fatigue.  Patient will benefit from continued physical therapy in hospital and recommended venue below to increase strength, balance, endurance for safe ADLs and gait.    Follow Up Recommendations SNF;Supervision - Intermittent;Supervision/Assistance - 24 hour    Equipment Recommendations  None recommended by PT    Recommendations for Other Services       Precautions / Restrictions Precautions Precautions: Fall Restrictions Weight Bearing Restrictions: No      Mobility  Bed Mobility Overal bed mobility: Needs Assistance Bed Mobility: Supine to Sit;Sit to Supine     Supine to sit: Supervision;Min guard Sit to supine: Min guard;Min assist   General bed mobility comments: slow labored movement  Transfers Overall transfer level: Needs assistance Equipment used: Rolling walker (2 wheeled);None Transfers: Sit to/from [employer] to Stand: Min assist Stand pivot transfers:  Min assist       General transfer comment: unsteady on feet having to lean on nearby objects for support, safer using RW  Ambulation/Gait Ambulation/Gait assistance: Min assist;Mod assist Gait Distance (Feet): 16 Feet Assistive device: Rolling walker (2 wheeled) Gait Pattern/deviations: Decreased step length - right;Decreased step length - left;Decreased stride length Gait velocity: decreased   General Gait Details: slow labored cadence with difficulty making turns due to BLE weakness and limited due to c/o fatigue, on 5 LPM O2 with SpO2 dropping from 93% to 80% during ambulation  Stairs            Wheelchair Mobility    Modified Rankin (Stroke Patients Only)       Balance Overall balance assessment: Needs assistance Sitting-balance support: Feet supported;No upper extremity supported Sitting balance-Leahy Scale: Fair Sitting balance - Comments: seated at bedside   Standing balance support: Bilateral upper extremity supported;During functional activity Standing balance-Leahy Scale: Fair Standing balance comment: using RW                             Pertinent Vitals/Pain Pain Assessment: No/denies pain    Home Living Family/patient expects to be discharged to:: Private residence Living Arrangements: Alone Available Help at Discharge: Family Type of Home: House Home Access: Stairs to enter Entrance Stairs-Rails: None Entrance Stairs-Number of Steps: 1 Home Layout: Two level Home Equipment: Bedside commode;Walker - 2 wheels;Wheelchair - Brewing technologist      Prior Function Level of Independence: Needs assistance   Gait / Transfers Assistance Needed: household and short distanced community ambulator without AD  ADL's / Homemaking Assistance Needed: assisted by son and daughter-n-law that lives next door per patient        Hand  Dominance        Extremity/Trunk Assessment   Upper Extremity Assessment Upper Extremity Assessment:  Generalized weakness    Lower Extremity Assessment Lower Extremity Assessment: Generalized weakness    Cervical / Trunk Assessment Cervical / Trunk Assessment: Kyphotic  Communication   Communication: No difficulties  Cognition Arousal/Alertness: Awake/alert Behavior During Therapy: WFL for tasks assessed/performed Overall Cognitive Status: Within Functional Limits for tasks assessed                                        General Comments      Exercises     Assessment/Plan    PT Assessment Patient needs continued PT services  PT Problem List Decreased strength;Decreased activity tolerance;Decreased balance;Decreased mobility       PT Treatment Interventions Balance training;Gait training;Stair training;Functional mobility training;Therapeutic activities;Therapeutic exercise    PT Goals (Current goals can be found in the Care Plan section)  Acute Rehab PT Goals Patient Stated Goal: return home with family to assist PT Goal Formulation: With patient Time For Goal Achievement: 04/12/19 Potential to Achieve Goals: Good    Frequency Min 3X/week   Barriers to discharge        Co-evaluation               AM-PAC PT "6 Clicks" Mobility  Outcome Measure Help needed turning from your back to your side while in a flat bed without using bedrails?: A Little Help needed moving from lying on your back to sitting on the side of a flat bed without using bedrails?: A Little Help needed moving to and from a bed to a chair (including a wheelchair)?: A Little Help needed standing up from a chair using your arms (e.g., wheelchair or bedside chair)?: A Little Help needed to walk in hospital room?: A Lot Help needed climbing 3-5 steps with a railing? : A Lot 6 Click Score: 16    End of Session Equipment Utilized During Treatment: Oxygen Activity Tolerance: Patient tolerated treatment well;Patient limited by fatigue Patient left: in bed;with call bell/phone  within reach Nurse Communication: Mobility status PT Visit Diagnosis: Unsteadiness on feet (R26.81);Other abnormalities of gait and mobility (R26.89);Muscle weakness (generalized) (M62.81)    Time: [id number] PT Time Calculation (min) (ACUTE ONLY): 33 min   Charges:   PT Evaluation $PT Eval Moderate Complexity: 1 Mod PT Treatments $Therapeutic Activity: 23-37 mins        3:04 PM, 03/29/19 Ocie BobJames Wing Gfeller, MPT Physical Therapist with [facility] [phone] office [phone] mobile phone

## 2019-03-29 NOTE — Progress Notes (Signed)
ANTICOAGULATION CONSULT NOTE -  Pharmacy Consult for lovenox--> heparin Indication: atrial fibrillation  Allergies  Allergen Reactions  . Codeine   . Penicillins   . Sulfa Antibiotics     Patient Measurements: Height: 5\' 4"  (162.6 cm) Weight: 115 lb 8.3 oz (52.4 kg) IBW/kg (Calculated) : 54.7  HEPARIN DW (KG): 47.6  Vital Signs: Temp: 98.5 F (36.9 C) (06/11 0405) Temp Source: Oral (06/11 0405) BP: 104/76 (06/11 0630) Pulse Rate: 72 (06/11 0630)  Labs: Recent Labs    03/26/19 0956  03/27/19 0612 03/28/19 0425 03/29/19 0453  HGB  --    < > 11.4* 11.3* 11.8*  HCT  --   --  35.9* 36.9 38.8  PLT  --   --  191 191 223  APTT 35  --  45*  --   --   HEPARINUNFRC 1.16*  --  0.70 0.68 0.36  CREATININE  --   --  1.32* 1.14* 0.79   < > = values in this interval not displayed.    Estimated Creatinine Clearance: 40.2 mL/min (by C-G formula based on SCr of 0.79 mg/dL).   Assessment: Pharmacy consulted to dose heparin(was on lovenox) for patient with atrial fibrillation.  Patient was on apixaban prior to admission with last dose given 6/7 AM, last dose of lovenox given 6/7 at 2100.  heparin level is 0.36  Goal of Therapy:   Heparin level 0.3-0.7 Monitor platelets by anticoagulation protocol: Yes    Plan:  Continue heparin at 500 units/hr Check   anti-Xa level  daily while on heparin Continue to monitor H&H and platelets   Margot Ables, PharmD Clinical Pharmacist 03/29/2019 7:35 AM

## 2019-03-29 NOTE — Progress Notes (Signed)
CVP monitoring discontinued per MD verbal order.  Celestia Khat, RN

## 2019-03-29 NOTE — Progress Notes (Signed)
[facility] Progress Note     Subjective: Doing well. No issues. Up to chair. Ng clamped. BM with the suppository. Not a lot of flatus this AM. BM reported to be solid/ hard.   Objective: Vital signs in last 24 hours: Temp:  [98 F (36.7 C)-98.9 F (37.2 C)] 98.5 F (36.9 C) (06/11 0742) Pulse Rate:  [45-127] 102 (06/11 1002) Resp:  [26-43] 42 (06/11 1002) BP: (104-146)/(48-115) 110/55 (06/11 1002) SpO2:  [87 %-97 %] 89 % (06/11 1002) Weight:  [52.4 kg] 52.4 kg (06/11 0405) Last BM Date: 03/28/19  Intake/Output from previous day: 06/10 0701 - 06/11 0700 In: 2544.8 [I.V.:2344.7; IV Piggyback:200.1] Out: 900 [Urine:650; Emesis/NG output:250] Intake/Output this shift: No intake/output data recorded.  General appearance: alert, cooperative and no distress Resp: normal work of breathing GI: soft, minimally distended, nontender  Lab Results:  Recent Labs    03/28/19 0425 03/29/19 0453  WBC 18.1* 18.0*  HGB 11.3* 11.8*  HCT 36.9 38.8  PLT 191 223   BMET Recent Labs    03/28/19 0425 03/29/19 0453  NA 139 141  K 4.3 4.6  CL 107 108  CO2 24 29  GLUCOSE 132* 141*  BUN 44* 39*  CREATININE 1.14* 0.79  CALCIUM 9.4 9.8   PT/INR No results for input(s): LABPROT, INR in the last 72 hours.  Studies/Results: Ct Chest Wo Contrast  Result Date: 03/27/2019 CLINICAL DATA:  Acute respiratory illness.  Hypoxia. EXAM: CT CHEST WITHOUT CONTRAST TECHNIQUE: Multidetector CT imaging of the chest was performed following the standard protocol without IV contrast. COMPARISON:  None FINDINGS: Cardiovascular: Moderate cardiac enlargement and small pericardial effusion. Aortic atherosclerosis. Calcification in the left main, left circumflex coronary arteries identified. Mediastinum/Nodes: Normal appearance of the thyroid gland. The trachea appears patent and is midline. Large hiatal hernia. Nasogastric tube is identified. The tube forms a loop within the hiatal hernia and  then continues into the intra-abdominal stomach. Right paratracheal lymph node measures 1.5 cm, image 53/4. Left paratracheal lymph node measures 1.6 cm, image 43/4. Subcarinal lymph node measures 2.1 cm. Hilar structures are suboptimally evaluated due to lack of IV contrast material. No axillary or supraclavicular adenopathy. Lungs/Pleura: Small bilateral pleural effusions identified. The left pleural effusion appears mildly loculated. There is subsegmental scratch set partial consolidation in atelectasis of the right lower lobe noted. There is complete airspace consolidation of the entire left lower lobe. Partial consolidation in atelectasis of the lingula. Patchy ground-glass and airspace densities noted within the right middle lobe and central left upper lobe. Upper Abdomen: No acute abnormality. Musculoskeletal: Bones appear osteopenic. Kyphoscoliosis deformity of the thoracic spine noted. IMPRESSION: 1. Small to moderate bilateral pleural effusions. Multifocal, bilateral airspace consolidation identified compatible with pneumonia. The left lower lobe is completely consolidated/collapsed. There is partial consolidation/collapse of the right lower lobe and lingula. Patchy ground-glass and airspace densities are noted within the right middle lobe and both upper lobes. 2. Enlarged mediastinal lymph nodes. In the setting of CHF and pneumonia these are nonspecific and may be reactive. Follow-up imaging with CT of the chest in 3 months is recommended. This recommendation follows ACR consensus guidelines: Managing Incidental Findings on Thoracic CT: Mediastinal and Cardiovascular Findings. A White Paper of the ACR Incidental Findings Committee. J Am Coll Radiol. 2018; 15: 4403-4742. 3. Cardiac enlargement and small pericardial effusion 4. Aortic Atherosclerosis (ICD10-I70.0). Coronary artery calcifications. Electronically Signed   By: Kerby Moors M.D.   On: 03/27/2019 15:36    Anti-infectives: Anti-infectives  (From admission,  onward)   Start     Dose/Rate Route Frequency Ordered Stop   03/27/19 1045  azithromycin (ZITHROMAX) 500 mg in sodium chloride 0.9 % 250 mL IVPB     500 mg 250 mL/hr over 60 Minutes Intravenous Every 24 hours 03/27/19 1038     03/26/19 0900  meropenem (MERREM) 1,000 mg in sodium chloride 0.9 % 100 mL IVPB     1,000 mg 200 mL/hr over 30 Minutes Intravenous Every 12 hours 03/26/19 0814     2018-12-05 2200  cefTRIAXone (ROCEPHIN) 2 g in sodium chloride 0.9 % 100 mL IVPB  Status:  Discontinued     2 g 200 mL/hr over 30 Minutes Intravenous Every 24 hours 2018-12-05 2044 03/26/19 0814   2018-12-05 2100  metroNIDAZOLE (FLAGYL) IVPB 500 mg  Status:  Discontinued     500 mg 100 mL/hr over 60 Minutes Intravenous Every 8 hours 2018-12-05 2044 03/26/19 [id number]      Assessment/Plan: Carrie Morales is an 85 yo with resolving SBO. She does not want to have the NG replaced, so NG is clamped right now.  -Continue suppositories -NG clamped -Ice and sips ok -If does well hopefully Ng out tomorrow   Discussed with Dr. Gwenlyn Morales.    LOS: 4 days    [name] 03/29/2019

## 2019-03-29 NOTE — Progress Notes (Signed)
PROGRESS NOTE  Carrie Morales ZOX:[id number] DOB: August 24, 1928 DOA: 03/23/2019 PCP: Alinda Deem, MD  Brief History:  85 year old female with a history of chronic atrial fibrillation on apixaban, diastolic CHF, hypertension, bowel obstruction status post bowel resection 30 years ago presenting with 1 week history of abdominal pain that worsened over the past 2 to 3 days to the point of resulting in nausea and vomiting.  The patient was also having constipation-like symptoms and tried some over-the-counter laxatives without relief.  Because of her progressive symptoms she presented for further evaluation.  CT of the abdomen and pelvis in the ED showed left basilar collapse less consolidative changes, small bilateral pleural effusions, left greater than right with a large hiatus hernia.  There was also dilatation of the proximal and mid small bowel loops up to 2.9 cm consistent with a high-grade SBO.  The patient was treated with bowel rest and IV fluids.  When she arrived to the medical floor, the patient developed atrial fibrillation with RVR and respiratory distress.  She was transferred to the stepdown unit and started on diltiazem drip and placed on BiPAP.  Subsequently, the patient became hypotensive requiring fluid resuscitation.  Repeat chest x-ray show worsening bilateral perihilar densities with stable bibasilar opacifications.  Lactic acid peaked at 4.4.  The patient was started on ceftriaxone and metronidazole.  Assessment/Plan: Sepsis -Present at the time of admission -Secondary to aspiration pneumonia/CAP -Lactic acid peaked at 4.4; follow trend -Judicious IV fluids given -procalcitonin  Peaked at 5.86 -Follow blood culture--neg so far -Urinalysis negative -Continue current IV antibiotics and supportive care.  Aspiration Pneumonia/Lobar pneumonia -Continue meropenem and azithromycin. -Continue oxygen supplementation, with intention to wean off as  tolerated -Continue supportive care follow clinical response.  Acute respiratory failure with hypoxia -Secondary to pneumonia  -Reaching 48 hours without the need of BiPAP -Still requiring oxygen supplementation and with appreciated tachypnea on minimal exertion. -continue supportive care, IV antibiotics and follow clinical response. -Given presence of pleural effusion and decreased breath sounds at the bases, patient started on low-dose Lasix following cardiology recommendations on 6/10; will continue for now. -Overall improving.  Small bowel obstruction -General surgery consult appreciated -6/10--case discussed with Dr. Henreitta Leber -Continue NG tube and low intermittent suction; overall with significant decrease in NG output. -Anticipating clamping trial later today. -Remains n.p.o.; with allowance for ice chips and sips of liquids. -Judicious IV fluids/electrolyte support. -Continue Protonix -Increase physical activity as tolerated. -Repeat 2 views abdominal x-ray in a.m.  Atrial fibrillation with RVR -Precipitated by her acute medical condition -Currently on diltiazem drip--which will be continue -Excellent response to the use of IV metoprolol; will continue current therapy. -Overall improved rate control. -Echocardiogram--EF >65%, no WMA, mod MR -Patient was on apixaban prior to admission; currently on hold secondary to n.p.o. status.  Continue on IV heparin. -appreciate cardiology consult  Chronic diastolic CHF -daily weights -Accurate I's and O's -Continue low-dose Lasix recommended by cardiology service. -Follow volume status.  Acute on chronic renal failure: Stage III at baseline.   -Secondary to hemodynamic changes and hypotension. -Baseline creatinine~0.8-1.1 -monitor with diuresis -Creatinine peak of 1.3; down to 1.1 today. -Continue to monitor renal function trend.  Hyperglycemia -check A1C--5.3 -Elevated blood sugars in the setting of stress  demargination.   Disposition Plan:   Remain in SDU Family Communication:   Son updated at bedside 6/9  Consultants:  General surgery; Cardiology  Code Status:  FULL  DVT Prophylaxis:   Continue  IV Heparin   Procedures: As Listed in Progress Note Above  Antibiotics: Ceftriaxone/flagyl 6/7>>6/8 Merrem 6/8>>>   Patient remains critically ill with multiple organ system failure requiring high complexity decision making for assessment and support, frequent evaluation and titration of therapies, application of advance monitoring technologies and extensive interpretation of multiple databases.  Time: 35 minutes.   Subjective: Patient began 48 hours without the need of BiPAP.  Currently afebrile, denying nausea, vomiting, abdominal pain, and chest pain.  Continues slowly improving.  Overall breathing easier.  Still requiring oxygen supplementation and with NG tube in place.  Objective: Vitals:   03/29/19 1500 03/29/19 1530 03/29/19 1559 03/29/19 1600  BP: 109/64 109/61  (!) 115/59  Pulse:      Resp: (!) 38 (!) 32 (!) 27 (!) 38  Temp:   (!) 97.4 F (36.3 C)   TempSrc:   Oral   SpO2:    92%  Weight:      Height:        Intake/Output Summary (Last 24 hours) at 03/29/2019 1639 Last data filed at 03/29/2019 1400 Gross per 24 hour  Intake 2904.04 ml  Output 901 ml  Net 2003.04 ml   Weight change: 2.9 kg  Exam: General exam: Alert, awake, oriented x 3; no fever.  Still requiring oxygen supplementation, but overall with improved breathing status and no requiring BiPAP.  No nausea, no vomiting, no chest pain or abdominal pain.  Had bowel movement last night after suppository. Respiratory system: Decreased breath sounds at the bases, positive scattered rhonchi, no using accessory muscles. Cardiovascular system: Rate controlled, irregular breathing; positive systolic ejection murmur, no rubs, no gallops.  Gastrointestinal system: Abdomen is nondistended, soft and  nontender. No organomegaly or masses felt.  Decreased breath sounds appreciated on exam. Central nervous system: Alert and oriented. No focal neurological deficits. Extremities: No C/C/E, +pedal pulses Skin: No rashes, lesions or ulcers Psychiatry: Judgement and insight appear normal. Mood & affect appropriate.    Data Reviewed: I have personally reviewed following labs and imaging studies   Basic Metabolic Panel: Recent Labs  Lab 20-Apr-2019 1117 03/26/19 0418 03/27/19 0612 03/28/19 0425 03/29/19 0453  NA 138 138 140 139 141  K 4.5 4.0 4.2 4.3 4.6  CL 95* 103 106 107 108  CO2 27 25 26 24 29   GLUCOSE 163* 200* 89 132* 141*  BUN 20 29* 38* 44* 39*  CREATININE 0.84 1.23* 1.32* 1.14* 0.79  CALCIUM 10.3 9.0 9.2 9.4 9.8  MG  --   --  2.0  --  2.3  PHOS  --   --  3.1  --   --    Liver Function Tests: Recent Labs  Lab 2019/04/20 1117 03/27/19 0612 03/28/19 0425  AST 25 18 16   ALT 25 17 16   ALKPHOS 67 40 53  BILITOT 1.5* 1.0 1.2  PROT 7.5 5.3* 5.2*  ALBUMIN 4.0 2.5* 2.4*   Recent Labs  Lab 04/20/19 1117  LIPASE 46   CBC: Recent Labs  Lab Apr 20, 2019 1117 03/26/19 0418 03/27/19 0612 03/28/19 0425 03/29/19 0453  WBC 12.6* 13.9* 15.5* 18.1* 18.0*  NEUTROABS 11.2*  --   --   --   --   HGB 16.6* 12.3 11.4* 11.3* 11.8*  HCT 53.1* 38.8 35.9* 36.9 38.8  MCV 96.2 95.8 95.2 97.6 98.5  PLT 304 237 191 191 223   Cardiac Enzymes: Recent Labs  Lab 04/20/19 1117  TROPONINI 0.03*   CBG: Recent Labs  Lab 03/26/19 1609 03/26/19 1938  03/27/19 0010 03/27/19 0444 03/27/19 0747  GLUCAP 88 98 93 75 75   HbA1C: No results for input(s): HGBA1C in the last 72 hours. Urine analysis:    Component Value Date/Time   COLORURINE AMBER (A) 04/11/2019 1158   APPEARANCEUR HAZY (A) 03/21/2019 1158   LABSPEC 1.027 03/19/2019 1158   PHURINE 5.0 04/02/2019 1158   GLUCOSEU NEGATIVE 03/19/2019 1158   HGBUR NEGATIVE 03/21/2019 1158   BILIRUBINUR NEGATIVE 04/02/2019 1158   KETONESUR  20 (A) 03/31/2019 1158   PROTEINUR 100 (A) 04/14/2019 1158   UROBILINOGEN 0.2 07/24/2014 1534   NITRITE NEGATIVE 04/04/2019 1158   LEUKOCYTESUR NEGATIVE 03/30/2019 1158    Recent Results (from the past 240 hour(s))  SARS Coronavirus 2 (CEPHEID - Performed in [facility] lab), Hosp Order     Status: None   Collection Time: 04/12/2019  2:26 PM   Specimen: Nasopharyngeal Swab  Result Value Ref Range Status   SARS Coronavirus 2 NEGATIVE NEGATIVE Final    Comment: (NOTE) If result is NEGATIVE SARS-CoV-2 target nucleic acids are NOT DETECTED. The SARS-CoV-2 RNA is generally detectable in upper and lower  respiratory specimens during the acute phase of infection. The lowest  concentration of SARS-CoV-2 viral copies this assay can detect is 250  copies / mL. A negative result does not preclude SARS-CoV-2 infection  and should not be used as the sole basis for treatment or other  patient management decisions.  A negative result may occur with  improper specimen collection / handling, submission of specimen other  than nasopharyngeal swab, presence of viral mutation(s) within the  areas targeted by this assay, and inadequate number of viral copies  (<250 copies / mL). A negative result must be combined with clinical  observations, patient history, and epidemiological information. If result is POSITIVE SARS-CoV-2 target nucleic acids are DETECTED. The SARS-CoV-2 RNA is generally detectable in upper and lower  respiratory specimens dur ing the acute phase of infection.  Positive  results are indicative of active infection with SARS-CoV-2.  Clinical  correlation with patient history and other diagnostic information is  necessary to determine patient infection status.  Positive results do  not rule out bacterial infection or co-infection with other viruses. If result is PRESUMPTIVE POSTIVE SARS-CoV-2 nucleic acids MAY BE PRESENT.   A presumptive positive result was obtained on the  submitted specimen  and confirmed on repeat testing.  While 2019 novel coronavirus  (SARS-CoV-2) nucleic acids may be present in the submitted sample  additional confirmatory testing may be necessary for epidemiological  and / or clinical management purposes  to differentiate between  SARS-CoV-2 and other Sarbecovirus currently known to infect humans.  If clinically indicated additional testing with an alternate test  methodology [phone]([id number]) is advised. The SARS-CoV-2 RNA is generally  detectable in upper and lower respiratory sp ecimens during the acute  phase of infection. The expected result is Negative. Fact Sheet for Patients:  BoilerBrush.com.cyhttps://www.fda.gov/media/136312/download Fact Sheet for Healthcare Providers: https://pope.com/https://www.fda.gov/media/136313/download This test is not yet approved or cleared by the [place] FDA and has been authorized for detection and/or diagnosis of SARS-CoV-2 by FDA under an Emergency Use Authorization (EUA).  This EUA will remain in effect (meaning this test can be used) for the duration of the COVID-19 declaration under Section 564(b)(1) of the Act, 21 U.S.C. section 360bbb-3(b)(1), unless the authorization is terminated or revoked sooner. Performed at [facility], [address] [place], [place] [address]   MRSA PCR Screening     Status:  None   Collection Time: 2019/04/14  7:54 PM   Specimen: Nasal Mucosa; Nasopharyngeal  Result Value Ref Range Status   MRSA by PCR NEGATIVE NEGATIVE Final    Comment:        The GeneXpert MRSA Assay (FDA approved for NASAL specimens only), is one component of a comprehensive MRSA colonization surveillance program. It is not intended to diagnose MRSA infection nor to guide or monitor treatment for MRSA infections. Performed at [facility], [address], [place], [place] [address]   Culture, blood (Routine X 2) w Reflex to ID Panel     Status: None (Preliminary result)   Collection Time: 04-14-2019  9:50 PM   Specimen:  Left Antecubital; Blood  Result Value Ref Range Status   Specimen Description LEFT ANTECUBITAL  Final   Special Requests   Final    BOTTLES DRAWN AEROBIC AND ANAEROBIC Blood Culture adequate volume   Culture   Final    NO GROWTH 4 DAYS Performed at [facility], [address] [place], [place] [address]    Report Status PENDING  Incomplete     Scheduled Meds:  bisacodyl  10 mg Rectal Daily   Chlorhexidine Gluconate Cloth  6 each Topical Daily   furosemide  20 mg Intravenous Daily   mouth rinse  15 mL Mouth Rinse q12n4p   metoprolol tartrate  2.5 mg Intravenous Q8H   pantoprazole (PROTONIX) IV  40 mg Intravenous Q24H   sodium chloride flush  10-40 mL Intracatheter Q12H   sodium chloride flush  3 mL Intravenous Q12H   Continuous Infusions:  sodium chloride 250 mL (03/26/19 0934)   azithromycin Stopped (03/29/19 1134)   dextrose 5% lactated ringers with KCl 20 mEq/L 75 mL/hr at 03/29/19 1400   diltiazem (CARDIZEM) infusion 12.5 mg/hr (03/29/19 1400)   heparin 500 Units/hr (03/29/19 1400)   meropenem (MERREM) IV Stopped (03/29/19 0948)    Procedures/Studies: Ct Chest Wo Contrast  Result Date: 03/27/2019 CLINICAL DATA:  Acute respiratory illness.  Hypoxia. EXAM: CT CHEST WITHOUT CONTRAST TECHNIQUE: Multidetector CT imaging of the chest was performed following the standard protocol without IV contrast. COMPARISON:  None FINDINGS: Cardiovascular: Moderate cardiac enlargement and small pericardial effusion. Aortic atherosclerosis. Calcification in the left main, left circumflex coronary arteries identified. Mediastinum/Nodes: Normal appearance of the thyroid gland. The trachea appears patent and is midline. Large hiatal hernia. Nasogastric tube is identified. The tube forms a loop within the hiatal hernia and then continues into the intra-abdominal stomach. Right paratracheal lymph node measures 1.5 cm, image 53/4. Left paratracheal lymph node measures 1.6 cm, image 43/4.  Subcarinal lymph node measures 2.1 cm. Hilar structures are suboptimally evaluated due to lack of IV contrast material. No axillary or supraclavicular adenopathy. Lungs/Pleura: Small bilateral pleural effusions identified. The left pleural effusion appears mildly loculated. There is subsegmental scratch set partial consolidation in atelectasis of the right lower lobe noted. There is complete airspace consolidation of the entire left lower lobe. Partial consolidation in atelectasis of the lingula. Patchy ground-glass and airspace densities noted within the right middle lobe and central left upper lobe. Upper Abdomen: No acute abnormality. Musculoskeletal: Bones appear osteopenic. Kyphoscoliosis deformity of the thoracic spine noted. IMPRESSION: 1. Small to moderate bilateral pleural effusions. Multifocal, bilateral airspace consolidation identified compatible with pneumonia. The left lower lobe is completely consolidated/collapsed. There is partial consolidation/collapse of the right lower lobe and lingula. Patchy ground-glass and airspace densities are noted within the right middle lobe and both upper lobes. 2. Enlarged mediastinal lymph  nodes. In the setting of CHF and pneumonia these are nonspecific and may be reactive. Follow-up imaging with CT of the chest in 3 months is recommended. This recommendation follows ACR consensus guidelines: Managing Incidental Findings on Thoracic CT: Mediastinal and Cardiovascular Findings. A White Paper of the ACR Incidental Findings Committee. J Am Coll Radiol. 2018; 15: 9604-5409: 1087-1096. 3. Cardiac enlargement and small pericardial effusion 4. Aortic Atherosclerosis (ICD10-I70.0). Coronary artery calcifications. Electronically Signed   By: Signa Kellaylor  Stroud M.D.   On: 03/27/2019 15:36   Ct Abdomen Pelvis W Contrast  Result Date: 03/19/2019 CLINICAL DATA:  Constipation since Thursday. Vomiting. Hysterectomy. Appendectomy. High-grade bowel obstruction. EXAM: CT ABDOMEN AND PELVIS WITH  CONTRAST TECHNIQUE: Multidetector CT imaging of the abdomen and pelvis was performed using the standard protocol following bolus administration of intravenous contrast. CONTRAST:  100mL OMNIPAQUE IOHEXOL 300 MG/ML  SOLN COMPARISON:  None. FINDINGS: Lower chest: Right base atelectasis. Left base collapse/consolidative change. Moderate cardiomegaly with small pericardial effusion. Small bilateral pleural effusions. A large hiatal hernia, with approximately 1/2 of the stomach positioned in the lower chest. Hepatobiliary: Anterior left hepatic lobe cyst. Cholecystectomy, without biliary ductal dilatation. Pancreas: Normal, without mass or ductal dilatation. Spleen: Normal in size, without focal abnormality. Adrenals/Urinary Tract: Normal adrenal glands. Bilateral renal low-density lesions which are likely cysts and minimally complex cysts. Other renal lesions are too small to characterize. No hydronephrosis. Normal urinary bladder. Stomach/Bowel: The stomach is distended and fluid-filled. The left side of the colon is relatively decompressed with scattered diverticula. Proximal and mid small bowel loops are dilated including at up to 2.9 cm. This continues to the level of a transition point within the central pelvis, likely in the proximal ileum on image 55/3 and coronal image 41. No obstructive mass identified. No complicating ischemia. Vascular/Lymphatic: Aortic and branch vessel atherosclerosis. No abdominopelvic adenopathy. Reproductive: Hysterectomy.  No adnexal mass. Other: Mild pelvic floor laxity.  No free intraperitoneal air. Musculoskeletal: Lumbosacral spondylosis with moderate convex left lumbar spine curvature. IMPRESSION: 1. High-grade partial small-bowel obstruction, to the level of the mid small bowel. Most likely related to adhesions. No complicating ischemia. 2. Distended stomach with large hiatal hernia. The patient may benefit from nasogastric tube placement. 3. Small bilateral pleural effusions with  left worse than right base airspace disease. On the right, likely atelectasis. On the left, infection or aspiration cannot be excluded. 4. Cardiomegaly with small pericardial effusion. 5.  Aortic Atherosclerosis (ICD10-I70.0). Electronically Signed   By: Jeronimo GreavesKyle  Talbot M.D.   On: 04/08/2019 14:02   Dg Chest Port 1 View  Result Date: 03/27/2019 CLINICAL DATA:  Acute respiratory failure with hypoxia. EXAM: PORTABLE CHEST 1 VIEW COMPARISON:  One-view chest x-ray 03/26/2019 FINDINGS: Heart is enlarged. Left hemidiaphragm is elevated. NG tube is in the stomach. Left-sided PICC line terminates in the right atrium. Pulmonary vascular congestion is increased. Bilateral infiltrates have increased. IMPRESSION: 1. Increasing bilateral infiltrates, left greater than right. This is concerning for infection. 2. Elevation of left hemidiaphragm. 3. Stable cardiomegaly. 4. Left-sided PICC line terminates in the right atrium. Electronically Signed   By: Marin Robertshristopher  Mattern M.D.   On: 03/27/2019 08:37   Dg Chest Port 1 View  Result Date: 03/26/2019 CLINICAL DATA:  Follow-up PICC line placement EXAM: PORTABLE CHEST 1 VIEW COMPARISON:  04/15/2019 FINDINGS: Left-sided PICC line is noted with catheter tip at the cavoatrial junction. Gastric catheter is noted coiled within the stomach. Elevation of the left hemidiaphragm is noted with some associated infiltrates bilaterally left greater than right  similar to that seen on the prior exam. No bony abnormality is noted. IMPRESSION: Stable infiltrates bilaterally left greater than right. New left-sided PICC line in satisfactory position. Electronically Signed   By: Alcide CleverMark  Lukens M.D.   On: 03/26/2019 12:46   Dg Chest Port 1 View  Result Date: 03/24/2019 CLINICAL DATA:  NG tube placement. EXAM: PORTABLE CHEST 1 VIEW COMPARISON:  03/31/2019 and CT 04/17/2019 FINDINGS: Patient is slightly rotated to the left. Nasogastric tube is coiled once over the stomach in patient's known large hiatal  hernia and then proceeds inferiorly over the left upper quadrant and off the film as tip is not visualized. Non radiopaque side-port is over the stomach in the left upper abdomen. Exam demonstrates adequate lung volumes with worsening bilateral perihilar opacification suggesting interstitial edema and less likely infection. Persistent opacification over the left base likely effusion with atelectasis. Stable mild hazy density over the right base which may represent a small effusion with atelectasis. Infection over the lung bases is possible. Stable cardiomegaly. Remainder of the exam is unchanged. IMPRESSION: Worsening bilateral perihilar opacification suggesting worsening interstitial edema. Stable bibasilar opacification likely small effusions left greater than right with associated basilar atelectasis. Infection in the mid to lower lungs is possible. Stable cardiomegaly. Nasogastric tube coiled once over the large hiatal hernia with side-port over the stomach in the left upper quadrant and tip not visualized. Electronically Signed   By: Elberta Fortisaniel  Boyle M.D.   On: 03/22/2019 20:28   Dg Chest Portable 1 View  Result Date: 03/19/2019 CLINICAL DATA:  Nasogastric tube placement EXAM: PORTABLE CHEST 1 VIEW COMPARISON:  Abdominal CT from earlier today FINDINGS: Nasogastric tube which at least reaches the stomach (which is low lying and distended by CT). There is haziness at both lung bases where there is atelectasis and pleural fluid by prior CT. Cardiopericardial enlargement. IMPRESSION: 1. The nasogastric tube reaches the stomach at least. 2. Cardiomegaly with bilateral pleural effusion obscuring the lower lobes. Electronically Signed   By: Marnee SpringJonathon  Watts M.D.   On: 04/13/2019 16:36   Dg Abd Portable 2v  Result Date: 03/26/2019 CLINICAL DATA:  Small bowel obstruction, colon resection. EXAM: PORTABLE ABDOMEN - 2 VIEW COMPARISON:  CT abdomen pelvis 04/05/2019. FINDINGS: Nasogastric tube terminates in the stomach.  Persistent small bowel dilatation. No minimal colonic gas. Retained contrast in the bladder with Foley catheter in place. Postoperative changes in the left lower quadrant. Incidental imaging of the lower chest shows left lower lobe consolidation with patchy airspace opacification bilaterally. Bilateral pleural effusions are noted as well. IMPRESSION: 1. Persistent small bowel obstruction. 2. Left lower lobe consolidation with patchy bilateral airspace opacification and bilateral pleural effusions, better evaluated on chest radiograph done the same day. Electronically Signed   By: Leanna BattlesMelinda  Blietz M.D.   On: 03/26/2019 12:46    Vassie Lollarlos Cecilie Heidel, MD  [facility] Pager [phone]   03/29/2019, 4:39 PM   LOS: 4 days

## 2019-03-30 ENCOUNTER — Inpatient Hospital Stay (HOSPITAL_COMMUNITY): Payer: Medicare Other | Admitting: Anesthesiology

## 2019-03-30 ENCOUNTER — Inpatient Hospital Stay (HOSPITAL_COMMUNITY): Payer: Medicare Other

## 2019-03-30 DIAGNOSIS — I2510 Atherosclerotic heart disease of native coronary artery without angina pectoris: Secondary | ICD-10-CM

## 2019-03-30 DIAGNOSIS — J80 Acute respiratory distress syndrome: Secondary | ICD-10-CM

## 2019-03-30 DIAGNOSIS — I10 Essential (primary) hypertension: Secondary | ICD-10-CM

## 2019-03-30 DIAGNOSIS — K56600 Partial intestinal obstruction, unspecified as to cause: Secondary | ICD-10-CM

## 2019-03-30 DIAGNOSIS — R7989 Other specified abnormal findings of blood chemistry: Secondary | ICD-10-CM

## 2019-03-30 DIAGNOSIS — I2583 Coronary atherosclerosis due to lipid rich plaque: Secondary | ICD-10-CM

## 2019-03-30 DIAGNOSIS — K5651 Intestinal adhesions [bands], with partial obstruction: Secondary | ICD-10-CM

## 2019-03-30 DIAGNOSIS — Z978 Presence of other specified devices: Secondary | ICD-10-CM

## 2019-03-30 DIAGNOSIS — J9621 Acute and chronic respiratory failure with hypoxia: Secondary | ICD-10-CM

## 2019-03-30 LAB — BLOOD GAS, ARTERIAL
Acid-Base Excess: 4.4 mmol/L — ABNORMAL HIGH (ref 0.0–2.0)
Acid-Base Excess: 3.2 mmol/L — ABNORMAL HIGH (ref 0.0–2.0)
Acid-Base Excess: 4.5 mmol/L — ABNORMAL HIGH (ref 0.0–2.0)
Bicarbonate: 27 mmol/L (ref 20.0–28.0)
Bicarbonate: 26 mmol/L (ref 20.0–28.0)
Bicarbonate: 27.6 mmol/L (ref 20.0–28.0)
FIO2: 100
FIO2: 100
FIO2: 100
O2 Saturation: 75.4 %
O2 Saturation: 89.4 %
O2 Saturation: 93 %
Patient temperature: 36.8
Patient temperature: 37.1
Patient temperature: 37
pCO2 arterial: 53.2 mmHg — ABNORMAL HIGH (ref 32.0–48.0)
pCO2 arterial: 58.3 mmHg — ABNORMAL HIGH (ref 32.0–48.0)
pCO2 arterial: 52.3 mmHg — ABNORMAL HIGH (ref 32.0–48.0)
pH, Arterial: 7.363 (ref 7.350–7.450)
pH, Arterial: 7.316 — ABNORMAL LOW (ref 7.350–7.450)
pH, Arterial: 7.37 (ref 7.350–7.450)
pO2, Arterial: 43.1 mmHg — ABNORMAL LOW (ref 83.0–108.0)
pO2, Arterial: 63.2 mmHg — ABNORMAL LOW (ref 83.0–108.0)
pO2, Arterial: 69.4 mmHg — ABNORMAL LOW (ref 83.0–108.0)

## 2019-03-30 LAB — CBC
HCT: 40 % (ref 36.0–46.0)
Hemoglobin: 12 g/dL (ref 12.0–15.0)
MCH: 29.9 pg (ref 26.0–34.0)
MCHC: 30 g/dL (ref 30.0–36.0)
MCV: 99.5 fL (ref 80.0–100.0)
Platelets: 224 10*3/uL (ref 150–400)
RBC: 4.02 MIL/uL (ref 3.87–5.11)
RDW: 14.2 % (ref 11.5–15.5)
WBC: 17.8 10*3/uL — ABNORMAL HIGH (ref 4.0–10.5)
nRBC: 0 % (ref 0.0–0.2)

## 2019-03-30 LAB — LACTIC ACID, PLASMA: Lactic Acid, Venous: 1.5 mmol/L (ref 0.5–1.9)

## 2019-03-30 LAB — CULTURE, BLOOD (ROUTINE X 2)
Culture: NO GROWTH
Special Requests: ADEQUATE

## 2019-03-30 LAB — GLUCOSE, CAPILLARY: Glucose-Capillary: 143 mg/dL — ABNORMAL HIGH (ref 70–99)

## 2019-03-30 LAB — HEPARIN LEVEL (UNFRACTIONATED)
Heparin Unfractionated: <0.1 IU/mL — ABNORMAL LOW (ref 0.30–0.70)
Heparin Unfractionated: <0.1 IU/mL — ABNORMAL LOW (ref 0.30–0.70)

## 2019-03-30 MED ORDER — HEPARIN BOLUS VIA INFUSION
1000.0000 [IU] | Freq: Once | INTRAVENOUS | Status: AC
  Administered 2019-03-30: 1000 [IU] via INTRAVENOUS
  Filled 2019-03-30: qty 1000

## 2019-03-30 MED ORDER — HEPARIN BOLUS VIA INFUSION
1500.0000 [IU] | Freq: Once | INTRAVENOUS | Status: AC
  Administered 2019-03-30: 1500 [IU] via INTRAVENOUS
  Filled 2019-03-30: qty 1500

## 2019-03-30 MED ORDER — CHLORHEXIDINE GLUCONATE 0.12% ORAL RINSE (MEDLINE KIT)
15.0000 mL | Freq: Two times a day (BID) | OROMUCOSAL | Status: DC
  Administered 2019-03-30 – 2019-04-10 (×22): 15 mL via OROMUCOSAL

## 2019-03-30 MED ORDER — ORAL CARE MOUTH RINSE
15.0000 mL | OROMUCOSAL | Status: DC
  Administered 2019-03-30 – 2019-04-10 (×104): 15 mL via OROMUCOSAL

## 2019-03-30 MED ORDER — FENTANYL BOLUS VIA INFUSION
25.0000 ug | INTRAVENOUS | Status: DC | PRN
  Administered 2019-04-05 – 2019-04-09 (×8): 25 ug via INTRAVENOUS
  Filled 2019-03-30: qty 25

## 2019-03-30 MED ORDER — BISACODYL 10 MG RE SUPP: 10.0000 mg | Freq: Every day | RECTAL | Status: DC | PRN

## 2019-03-30 MED ORDER — FENTANYL 2500MCG IN NS 250ML (10MCG/ML) PREMIX INFUSION
25.0000 ug/h | INTRAVENOUS | Status: DC
  Administered 2019-03-30: 14:00:00 25 ug/h via INTRAVENOUS
  Administered 2019-04-02 – 2019-04-03 (×2): 50 ug/h via INTRAVENOUS
  Administered 2019-04-05: 75 ug/h via INTRAVENOUS
  Administered 2019-04-07: 25 ug/h via INTRAVENOUS
  Filled 2019-03-30 (×6): qty 250

## 2019-03-30 MED ORDER — MIDAZOLAM HCL 2 MG/2ML IJ SOLN
1.0000 mg | INTRAMUSCULAR | Status: DC | PRN
  Administered 2019-03-31 – 2019-04-04 (×2): 1 mg via INTRAVENOUS
  Filled 2019-03-30: qty 2

## 2019-03-30 MED ORDER — MIDAZOLAM HCL 2 MG/2ML IJ SOLN: 1.0000 mg | INTRAMUSCULAR | Status: DC | PRN

## 2019-03-30 MED ORDER — FENTANYL CITRATE (PF) 100 MCG/2ML IJ SOLN
25.0000 ug | Freq: Once | INTRAMUSCULAR | Status: AC
  Administered 2019-03-30: 14:00:00 25 ug via INTRAVENOUS

## 2019-03-30 MED ORDER — FENTANYL CITRATE (PF) 100 MCG/2ML IJ SOLN: 25.0000 ug | INTRAMUSCULAR | Status: DC | PRN

## 2019-03-30 MED ORDER — METOPROLOL TARTRATE 5 MG/5ML IV SOLN
5.0000 mg | Freq: Three times a day (TID) | INTRAVENOUS | Status: DC
  Administered 2019-03-30 – 2019-04-05 (×16): 5 mg via INTRAVENOUS
  Filled 2019-03-30 (×17): qty 5

## 2019-03-30 MED ORDER — PROPOFOL 10 MG/ML IV BOLUS
INTRAVENOUS | Status: DC | PRN
  Administered 2019-03-30: 20 mg via INTRAVENOUS

## 2019-03-30 MED ORDER — SUCCINYLCHOLINE CHLORIDE 20 MG/ML IJ SOLN
INTRAMUSCULAR | Status: DC | PRN
  Administered 2019-03-30: 100 mg via INTRAVENOUS

## 2019-03-30 MED ORDER — FUROSEMIDE 10 MG/ML IJ SOLN
40.0000 mg | Freq: Two times a day (BID) | INTRAMUSCULAR | Status: DC
  Administered 2019-03-30 – 2019-04-02 (×6): 40 mg via INTRAVENOUS
  Filled 2019-03-30 (×6): qty 4

## 2019-03-30 MED ORDER — MIDAZOLAM HCL 2 MG/2ML IJ SOLN
1.0000 mg | INTRAMUSCULAR | Status: DC | PRN
  Filled 2019-03-30: qty 2

## 2019-03-30 NOTE — Progress Notes (Signed)
PROGRESS NOTE  Carrie Morales FBP:[id number] DOB: [birth year] DOA: 04/16/2019 PCP: Earney Mallet, MD  Brief History:  85 year old female with a history of chronic atrial fibrillation on apixaban, diastolic CHF, hypertension, bowel obstruction status post bowel resection 30 years ago presenting with 1 week history of abdominal pain that worsened over the past 2 to 3 days to the point of resulting in nausea and vomiting.  The patient was also having constipation-like symptoms and tried some over-the-counter laxatives without relief.  Because of her progressive symptoms she presented for further evaluation.  CT of the abdomen and pelvis in the ED showed left basilar collapse less consolidative changes, small bilateral pleural effusions, left greater than right with a large hiatus hernia.  There was also dilatation of the proximal and mid small bowel loops up to 2.9 cm consistent with a high-grade SBO.  The patient was treated with bowel rest and IV fluids.  When she arrived to the medical floor, the patient developed atrial fibrillation with RVR and respiratory distress.  She was transferred to the stepdown unit and started on diltiazem drip and placed on BiPAP.  Subsequently, the patient became hypotensive requiring fluid resuscitation.  Repeat chest x-ray show worsening bilateral perihilar densities with stable bibasilar opacifications.  Lactic acid peaked at 4.4.  The patient was started on ceftriaxone and metronidazole.  Assessment/Plan: Sepsis -Present at the time of admission -Secondary to aspiration pneumonia/CAP -Lactic acid peaked at 4.4; now normal. -procalcitonin  Peaked at 5.86 -WBCs started to trend down. -Follow blood culture--neg so far -Urinalysis negative -Continue current IV antibiotics and supportive care.  Acute respiratory failure with hypoxia hypercapnia -Secondary to pneumonia, ARDS and acute on chronic heart failure. -Patient failed response of BiPAP and in  the requiring intubation and  mechanical ventilation -Follow ARDS protocol -Appreciate pulmonology's assistance and recommendations -Continue current IV antibiotics -Diuresis as tolerated -Follow clinical response.  Small bowel obstruction -General surgery consult appreciated -6/10--case discussed with Dr. Constance Haw -Patient having bowel movements -NG tube has been discontinued following general surgery recommendations. -Continue electrolytes repletion. -Continue Protonix -Following stability will ended requiring tube feedings while intubated. -Anticipating clamping trial later today. -Remains n.p.o. today -Repeat 2 views abdominal x-ray demonstrated improvement in the small bowel dilatation and not new focal abnormality.  Atrial fibrillation with RVR -Precipitated by her acute medical condition -Excellent response to the use of IV metoprolol -Will discontinue Cardizem and) to have better blood pressure control for diuresis -Continue heparin drip -Overall with improved rate control. -Echocardiogram--EF >65%, no WMA, mod MR -Appreciate cardiology consult  Acute on chronic diastolic CHF -Continue daily weights -Follow strict I's and O's -IV Lasix 40 mg every 12 hours -Follow volume status.  Acute on chronic renal failure: Stage III at baseline.   -Secondary to hemodynamic changes and hypotension. -Baseline creatinine~0.8-1.1 -monitor with diuresis -Creatinine peak of 1.3; and then down to 1.1 -Will continue to monitor trend especially while receiving IV Lasix now.  Hyperglycemia -check A1C--5.3 -Elevated blood sugars in the setting of stress demargination.   Disposition Plan:   Remain in SDU Family Communication:   Son updated at bedside 6/9  Consultants:  General surgery; Cardiology; pulmonology  Code Status:  FULL  DVT Prophylaxis:   Continue IV Heparin   Procedures: As Listed in Progress Note Above  Antibiotics: Ceftriaxone/flagyl  6/7>>6/8 Merrem 6/8>>>   Patient remains critically ill with multiple organ system failure requiring high complexity decision making for assessment and support, frequent evaluation  and titration of therapies, application of advance monitoring technologies and extensive interpretation of multiple databases.  Patient's son was updated at baseline and all questions answered.  Chest x-ray demonstrated ARDS pattern with high concerns for severe vascular congestion.  Reviewing records patient is +5 L since admission.  ABG while receiving 100% BiPAP with O2 sats in the 40% and severe tachypnea with respiratory rate in the 40s to 50s.  Patient has been electively intubated.  Critical care time: 45 minutes.   Subjective: NG tube removed at the end of 03/29/2019; denies abdominal pain, no chest pain, no nausea or vomiting.  Continues to have bowel movements.  Worsening respiratory status since 2 AM in the morning requiring placement on BiPAP.  Objective: Vitals:   03/30/19 1715 03/30/19 1730 03/30/19 1745 03/30/19 1800  BP: (!) 103/52 93/60 (!) 106/55 (!) 90/52  Pulse: (!) 112 (!) 117 72 67  Resp: (!) 35 (!) 34 (!) 32 (!) 31  Temp:      TempSrc:      SpO2: 100% 100% 100% 100%  Weight:      Height:        Intake/Output Summary (Last 24 hours) at 03/30/2019 1805 Last data filed at 03/30/2019 1804 Gross per 24 hour  Intake 2678.29 ml  Output 1050 ml  Net 1628.29 ml   Weight change: -2.7 kg  Exam: General exam: Alert, awake, oriented x 3; experiencing significant shortness of breath, tachypnea and hypoxia since 2 AM in the morning.  Patient was placed on BiPAP and despite that intervention continue to have a heart rate in the 50s and ABG demonstrating O2 sats in the 40 percent. Respiratory system: Fine crackles at the bases, diffuse rhonchi it, positive tachypnea, mild use of accessory muscles. Cardiovascular system: Irregular, positive systolic murmur, no rubs, no gallops. Gastrointestinal  system: Abdomen is nondistended, soft and nontender. No organomegaly or masses felt.  Positive bowel sounds.  NG tube will discontinued on 03/29/2019. Central nervous system: Alert and oriented. No focal neurological deficits. Extremities: No C/C/E in her lower extremities, +pedal pulses.  Positive trace-1+ edema in her upper extremities bilaterally. Skin: No rashes, lesions or ulcers Psychiatry: Judgement and insight appear normal. Mood & affect appropriate.    Data Reviewed: I have personally reviewed following labs and imaging studies   Basic Metabolic Panel: Recent Labs  Lab 04/06/2019 1117 03/26/19 0418 03/27/19 0612 03/28/19 0425 03/29/19 0453  NA 138 138 140 139 141  K 4.5 4.0 4.2 4.3 4.6  CL 95* 103 106 107 108  CO2 _0 GLUCOSE 163* 200* 89 132* 141*  BUN 20 29* 38* 44* 39*  CREATININE 0.84 1.23* 1.32* 1.14* 0.79  CALCIUM 10.3 9.0 9.2 9.4 9.8  MG  --   --  2.0  --  2.3  PHOS  --   --  3.1  --   --    Liver Function Tests: Recent Labs  Lab 03/22/2019 1117 03/27/19 0612 03/28/19 0425  AST _1 ALT _2 ALKPHOS 67 40 53  BILITOT 1.5* 1.0 1.2  PROT 7.5 5.3* 5.2*  ALBUMIN 4.0 2.5* 2.4*   Recent Labs  Lab 03/27/2019 1117  LIPASE 46   CBC: Recent Labs  Lab 04/15/2019 1117 03/26/19 0418 03/27/19 0612 03/28/19 0425 03/29/19 0453 03/30/19 0444  WBC 12.6* 13.9* 15.5* 18.1* 18.0* 17.8*  NEUTROABS 11.2*  --   --   --   --   --   HGB  16.6* 12.3 11.4* 11.3* 11.8* 12.0  HCT 53.1* 38.8 35.9* 36.9 38.8 40.0  MCV 96.2 95.8 95.2 97.6 98.5 99.5  PLT 304 237 191 191 223 224   Cardiac Enzymes: Recent Labs  Lab 03/27/2019 1117  TROPONINI 0.03*   CBG: Recent Labs  Lab 03/26/19 1609 03/26/19 1938 03/27/19 0010 03/27/19 0444 03/27/19 0747  GLUCAP 88 98 93 75 75   HbA1C: No results for input(s): HGBA1C in the last 72 hours. Urine analysis:    Component Value Date/Time   COLORURINE AMBER (A) 04/14/2019 1158   APPEARANCEUR HAZY (A) 03/24/2019  1158   LABSPEC 1.027 04/13/2019 1158   PHURINE 5.0 04/08/2019 1158   GLUCOSEU NEGATIVE 03/30/2019 1158   HGBUR NEGATIVE 03/23/2019 1158   BILIRUBINUR NEGATIVE 04/17/2019 1158   KETONESUR 20 (A) 04/09/2019 1158   PROTEINUR 100 (A) 03/21/2019 1158   UROBILINOGEN 0.2 07/24/2014 1534   NITRITE NEGATIVE 03/29/2019 1158   LEUKOCYTESUR NEGATIVE 04/16/2019 1158    Recent Results (from the past 240 hour(s))  SARS Coronavirus 2 (CEPHEID - Performed in [place] hospital lab), Hosp Order     Status: None   Collection Time: 04/04/2019  2:26 PM   Specimen: Nasopharyngeal Swab  Result Value Ref Range Status   SARS Coronavirus 2 NEGATIVE NEGATIVE Final    Comment: (NOTE) If result is NEGATIVE SARS-CoV-2 target nucleic acids are NOT DETECTED. The SARS-CoV-2 RNA is generally detectable in upper and lower  respiratory specimens during the acute phase of infection. The lowest  concentration of SARS-CoV-2 viral copies this assay can detect is 250  copies / mL. A negative result does not preclude SARS-CoV-2 infection  and should not be used as the sole basis for treatment or other  patient management decisions.  A negative result may occur with  improper specimen collection / handling, submission of specimen other  than nasopharyngeal swab, presence of viral mutation(s) within the  areas targeted by this assay, and inadequate number of viral copies  (<250 copies / mL). A negative result must be combined with clinical  observations, patient history, and epidemiological information. If result is POSITIVE SARS-CoV-2 target nucleic acids are DETECTED. The SARS-CoV-2 RNA is generally detectable in upper and lower  respiratory specimens dur ing the acute phase of infection.  Positive  results are indicative of active infection with SARS-CoV-2.  Clinical  correlation with patient history and other diagnostic information is  necessary to determine patient infection status.  Positive results do  not rule  out bacterial infection or co-infection with other viruses. If result is PRESUMPTIVE POSTIVE SARS-CoV-2 nucleic acids MAY BE PRESENT.   A presumptive positive result was obtained on the submitted specimen  and confirmed on repeat testing.  While 2019 novel coronavirus  (SARS-CoV-2) nucleic acids may be present in the submitted sample  additional confirmatory testing may be necessary for epidemiological  and / or clinical management purposes  to differentiate between  SARS-CoV-2 and other Sarbecovirus currently known to infect humans.  If clinically indicated additional testing with an alternate test  methodology [id number]) is advised. The SARS-CoV-2 RNA is generally  detectable in upper and lower respiratory sp ecimens during the acute  phase of infection. The expected result is Negative. Fact Sheet for Patients:  StrictlyIdeas.no Fact Sheet for Healthcare Providers: BankingDealers.co.za This test is not yet approved or cleared by the [place] FDA and has been authorized for detection and/or diagnosis of SARS-CoV-2 by FDA under an Emergency Use Authorization (EUA).  This EUA will remain  in effect (meaning this test can be used) for the duration of the COVID-19 declaration under Section 564(b)(1) of the Act, 21 U.S.C. section 360bbb-3(b)(1), unless the authorization is terminated or revoked sooner. Performed at [facility], [address] [place], [place] [address]   MRSA PCR Screening     Status: None   Collection Time: 04/04/2019  7:54 PM   Specimen: Nasal Mucosa; Nasopharyngeal  Result Value Ref Range Status   MRSA by PCR NEGATIVE NEGATIVE Final    Comment:        The GeneXpert MRSA Assay (FDA approved for NASAL specimens only), is one component of a comprehensive MRSA colonization surveillance program. It is not intended to diagnose MRSA infection nor to guide or monitor treatment for MRSA infections. Performed at [facility], [address] [place], [place] [address]   Culture, blood (Routine X 2) w Reflex to ID Panel     Status: None   Collection Time: 03/22/2019  9:50 PM   Specimen: Left Antecubital; Blood  Result Value Ref Range Status   Specimen Description LEFT ANTECUBITAL  Final   Special Requests   Final    BOTTLES DRAWN AEROBIC AND ANAEROBIC Blood Culture adequate volume   Culture   Final    NO GROWTH 5 DAYS Performed at [facility], [address], [place], [place] [address]    Report Status 03/30/2019 FINAL  Final     Scheduled Meds:  chlorhexidine gluconate (MEDLINE KIT)  15 mL Mouth Rinse BID   Chlorhexidine Gluconate Cloth  6 each Topical Daily   furosemide  40 mg Intravenous BID   mouth rinse  15 mL Mouth Rinse 10 times per day   metoprolol tartrate  2.5 mg Intravenous Q8H   pantoprazole (PROTONIX) IV  40 mg Intravenous Q24H   sodium chloride flush  10-40 mL Intracatheter Q12H   sodium chloride flush  3 mL Intravenous Q12H   Continuous Infusions:  sodium chloride 250 mL (03/26/19 0934)   dextrose 5% lactated ringers with KCl 20 mEq/L 75 mL/hr at 03/30/19 1804   diltiazem (CARDIZEM) infusion 5 mg/hr (03/30/19 1804)   fentaNYL infusion INTRAVENOUS 25 mcg/hr (03/30/19 1356)   heparin 900 Units/hr (03/30/19 1804)   meropenem (MERREM) IV Stopped (03/30/19 0826)    Procedures/Studies: Ct Chest Wo Contrast  Result Date: 03/27/2019 CLINICAL DATA:  Acute respiratory illness.  Hypoxia. EXAM: CT CHEST WITHOUT CONTRAST TECHNIQUE: Multidetector CT imaging of the chest was performed following the standard protocol without IV contrast. COMPARISON:  None FINDINGS: Cardiovascular: Moderate cardiac enlargement and small pericardial effusion. Aortic atherosclerosis. Calcification in the left main, left circumflex coronary arteries identified. Mediastinum/Nodes: Normal appearance of the thyroid gland. The trachea appears patent and is midline. Large hiatal hernia. Nasogastric tube is  identified. The tube forms a loop within the hiatal hernia and then continues into the intra-abdominal stomach. Right paratracheal lymph node measures 1.5 cm, image 53/4. Left paratracheal lymph node measures 1.6 cm, image 43/4. Subcarinal lymph node measures 2.1 cm. Hilar structures are suboptimally evaluated due to lack of IV contrast material. No axillary or supraclavicular adenopathy. Lungs/Pleura: Small bilateral pleural effusions identified. The left pleural effusion appears mildly loculated. There is subsegmental scratch set partial consolidation in atelectasis of the right lower lobe noted. There is complete airspace consolidation of the entire left lower lobe. Partial consolidation in atelectasis of the lingula. Patchy ground-glass and airspace densities noted within the right middle lobe and central left upper lobe. Upper Abdomen: No acute abnormality. Musculoskeletal: Bones  appear osteopenic. Kyphoscoliosis deformity of the thoracic spine noted. IMPRESSION: 1. Small to moderate bilateral pleural effusions. Multifocal, bilateral airspace consolidation identified compatible with pneumonia. The left lower lobe is completely consolidated/collapsed. There is partial consolidation/collapse of the right lower lobe and lingula. Patchy ground-glass and airspace densities are noted within the right middle lobe and both upper lobes. 2. Enlarged mediastinal lymph nodes. In the setting of CHF and pneumonia these are nonspecific and may be reactive. Follow-up imaging with CT of the chest in 3 months is recommended. This recommendation follows ACR consensus guidelines: Managing Incidental Findings on Thoracic CT: Mediastinal and Cardiovascular Findings. A White Paper of the ACR Incidental Findings Committee. J Am Coll Radiol. 2018; 15: 1829-9371. 3. Cardiac enlargement and small pericardial effusion 4. Aortic Atherosclerosis (ICD10-I70.0). Coronary artery calcifications. Electronically Signed   By: Kerby Moors M.D.    On: 03/27/2019 15:36   Ct Abdomen Pelvis W Contrast  Result Date: 04/12/2019 CLINICAL DATA:  Constipation since Thursday. Vomiting. Hysterectomy. Appendectomy. High-grade bowel obstruction. EXAM: CT ABDOMEN AND PELVIS WITH CONTRAST TECHNIQUE: Multidetector CT imaging of the abdomen and pelvis was performed using the standard protocol following bolus administration of intravenous contrast. CONTRAST:  127m OMNIPAQUE IOHEXOL 300 MG/ML  SOLN COMPARISON:  None. FINDINGS: Lower chest: Right base atelectasis. Left base collapse/consolidative change. Moderate cardiomegaly with small pericardial effusion. Small bilateral pleural effusions. A large hiatal hernia, with approximately 1/2 of the stomach positioned in the lower chest. Hepatobiliary: Anterior left hepatic lobe cyst. Cholecystectomy, without biliary ductal dilatation. Pancreas: Normal, without mass or ductal dilatation. Spleen: Normal in size, without focal abnormality. Adrenals/Urinary Tract: Normal adrenal glands. Bilateral renal low-density lesions which are likely cysts and minimally complex cysts. Other renal lesions are too small to characterize. No hydronephrosis. Normal urinary bladder. Stomach/Bowel: The stomach is distended and fluid-filled. The left side of the colon is relatively decompressed with scattered diverticula. Proximal and mid small bowel loops are dilated including at up to 2.9 cm. This continues to the level of a transition point within the central pelvis, likely in the proximal ileum on image 55/3 and coronal image 41. No obstructive mass identified. No complicating ischemia. Vascular/Lymphatic: Aortic and branch vessel atherosclerosis. No abdominopelvic adenopathy. Reproductive: Hysterectomy.  No adnexal mass. Other: Mild pelvic floor laxity.  No free intraperitoneal air. Musculoskeletal: Lumbosacral spondylosis with moderate convex left lumbar spine curvature. IMPRESSION: 1. High-grade partial small-bowel obstruction, to the level of  the mid small bowel. Most likely related to adhesions. No complicating ischemia. 2. Distended stomach with large hiatal hernia. The patient may benefit from nasogastric tube placement. 3. Small bilateral pleural effusions with left worse than right base airspace disease. On the right, likely atelectasis. On the left, infection or aspiration cannot be excluded. 4. Cardiomegaly with small pericardial effusion. 5.  Aortic Atherosclerosis (ICD10-I70.0). Electronically Signed   By: KAbigail MiyamotoM.D.   On: 04/01/2019 14:02   Dg Chest Port 1 View  Result Date: 03/30/2019 CLINICAL DATA:  Shortness of breath. EXAM: PORTABLE CHEST 1 VIEW COMPARISON:  03/27/2019 FINDINGS: The heart is enlarged but stable.  Left PICC line appears stable. Severe and progressive bilateral airspace process. Persistent small pleural effusions. No pneumothorax. IMPRESSION: Severe and progressive bilateral airspace process. Electronically Signed   By: PMarijo SanesM.D.   On: 03/30/2019 10:41   Dg Chest Port 1 View  Result Date: 03/27/2019 CLINICAL DATA:  Acute respiratory failure with hypoxia. EXAM: PORTABLE CHEST 1 VIEW COMPARISON:  One-view chest x-ray 03/26/2019 FINDINGS: Heart is enlarged. Left  hemidiaphragm is elevated. NG tube is in the stomach. Left-sided PICC line terminates in the right atrium. Pulmonary vascular congestion is increased. Bilateral infiltrates have increased. IMPRESSION: 1. Increasing bilateral infiltrates, left greater than right. This is concerning for infection. 2. Elevation of left hemidiaphragm. 3. Stable cardiomegaly. 4. Left-sided PICC line terminates in the right atrium. Electronically Signed   By: San Morelle M.D.   On: 03/27/2019 08:37   Dg Chest Port 1 View  Result Date: 03/26/2019 CLINICAL DATA:  Follow-up PICC line placement EXAM: PORTABLE CHEST 1 VIEW COMPARISON:  03/28/2019 FINDINGS: Left-sided PICC line is noted with catheter tip at the cavoatrial junction. Gastric catheter is noted coiled  within the stomach. Elevation of the left hemidiaphragm is noted with some associated infiltrates bilaterally left greater than right similar to that seen on the prior exam. No bony abnormality is noted. IMPRESSION: Stable infiltrates bilaterally left greater than right. New left-sided PICC line in satisfactory position. Electronically Signed   By: Inez Catalina M.D.   On: 03/26/2019 12:46   Dg Chest Port 1 View  Result Date: 03/29/2019 CLINICAL DATA:  NG tube placement. EXAM: PORTABLE CHEST 1 VIEW COMPARISON:  04/13/2019 and CT 04/06/2019 FINDINGS: Patient is slightly rotated to the left. Nasogastric tube is coiled once over the stomach in patient's known large hiatal hernia and then proceeds inferiorly over the left upper quadrant and off the film as tip is not visualized. Non radiopaque side-port is over the stomach in the left upper abdomen. Exam demonstrates adequate lung volumes with worsening bilateral perihilar opacification suggesting interstitial edema and less likely infection. Persistent opacification over the left base likely effusion with atelectasis. Stable mild hazy density over the right base which may represent a small effusion with atelectasis. Infection over the lung bases is possible. Stable cardiomegaly. Remainder of the exam is unchanged. IMPRESSION: Worsening bilateral perihilar opacification suggesting worsening interstitial edema. Stable bibasilar opacification likely small effusions left greater than right with associated basilar atelectasis. Infection in the mid to lower lungs is possible. Stable cardiomegaly. Nasogastric tube coiled once over the large hiatal hernia with side-port over the stomach in the left upper quadrant and tip not visualized. Electronically Signed   By: Marin Olp M.D.   On: 03/28/2019 20:28   Dg Chest Portable 1 View  Result Date: 03/29/2019 CLINICAL DATA:  Nasogastric tube placement EXAM: PORTABLE CHEST 1 VIEW COMPARISON:  Abdominal CT from earlier today  FINDINGS: Nasogastric tube which at least reaches the stomach (which is low lying and distended by CT). There is haziness at both lung bases where there is atelectasis and pleural fluid by prior CT. Cardiopericardial enlargement. IMPRESSION: 1. The nasogastric tube reaches the stomach at least. 2. Cardiomegaly with bilateral pleural effusion obscuring the lower lobes. Electronically Signed   By: Monte Fantasia M.D.   On: 04/08/2019 16:36   Dg Chest Port 1v Same Day  Result Date: 03/30/2019 CLINICAL DATA:  Status post intubation and OG tube placement today. EXAM: PORTABLE CHEST 1 VIEW COMPARISON:  Single-view of the chest earlier today. FINDINGS: Endotracheal tube tip is 1.5 cm above the carina. The tube could be withdrawn 1-2 cm for better positioning. OG tube courses into the stomach and below the inferior margin the film. Severe and diffuse bilateral airspace disease is again seen. Cardiac silhouette is obscured. No pneumothorax is identified. IMPRESSION: ETT tip is 1.5 cm above the carina. The tube could be withdrawn 1-2 cm. OG tube courses into the stomach and below the inferior margin the  film. No change in diffuse bilateral airspace disease. Electronically Signed   By: Inge Rise M.D.   On: 03/30/2019 11:53   Dg Abd 2 Views  Result Date: 03/30/2019 CLINICAL DATA:  Follow up small bowel obstruction EXAM: ABDOMEN - 2 VIEW COMPARISON:  03/26/2019 FINDINGS: Stable scoliosis is noted. Bibasilar infiltrative changes are seen. A few persistent loops of dilated small bowel are seen. No free air is noted. The overall appearance has improved slightly in the interval from the prior exam. IMPRESSION: Slight improvement in mild small bowel dilatation. No new focal abnormality is seen. Electronically Signed   By: Inez Catalina M.D.   On: 03/30/2019 08:08   Dg Abd Portable 2v  Result Date: 03/26/2019 CLINICAL DATA:  Small bowel obstruction, colon resection. EXAM: PORTABLE ABDOMEN - 2 VIEW COMPARISON:  CT  abdomen pelvis 04/15/2019. FINDINGS: Nasogastric tube terminates in the stomach. Persistent small bowel dilatation. No minimal colonic gas. Retained contrast in the bladder with Foley catheter in place. Postoperative changes in the left lower quadrant. Incidental imaging of the lower chest shows left lower lobe consolidation with patchy airspace opacification bilaterally. Bilateral pleural effusions are noted as well. IMPRESSION: 1. Persistent small bowel obstruction. 2. Left lower lobe consolidation with patchy bilateral airspace opacification and bilateral pleural effusions, better evaluated on chest radiograph done the same day. Electronically Signed   By: Lorin Picket M.D.   On: 03/26/2019 12:46    Barton Dubois, MD  [facility] Pager [phone]   03/30/2019, 6:05 PM   LOS: 5 days

## 2019-03-30 NOTE — Progress Notes (Signed)
Initial Nutrition Assessment  DOCUMENTATION CODES:  Not applicable  INTERVENTION:  As of tomorrow, it will be 1 week since patient has taken any nutrition orally. Once cleared by surgery and hemodynamically stable, recommend prompt initiation of EN support. Recommendations are as follow:  Vital High Protein '@50cc'$ /hr (1242m per day) to provide 1200 kcals, 105 gm protein, 1003 ml free water daily.  NUTRITION DIAGNOSIS:  Inadequate oral intake related to inability to eat as evidenced by NPO status.  GOAL:  Oral intake to meet >90% of needs  MONITOR:  PO intake, Weight trends, Supplement acceptance, Diet advancement, Vent status, Labs, I & O's, TF tolerance  REASON FOR ASSESSMENT:  Ventilator    ASSESSMENT:  85y/o female PMHx Afib, HTN, dCHF, CKD3, and extensive hx of abd surgeries, including partial colectomy. Presented to ED 6/7 with n/v, no bm x4 days (usually has BM daily) and inability to tolerate oral intake. Abd CT showed High grade partial SBO at level of mid small bowel as well as possible aspiration PNA. Pt had ngt placed and met sepsis criteria on admission.   Pt identified as newly intubated patient. Per chart, pt was progressing from SBO presumably d/t adhesions up until last night when her respiratory status began to acutely deteriorate. CXR showed white out of L lung and substantial infiltrate on right. MD reports pt has ARDS.   Pt intubated/sedated. Her son is at bedside- was cleared by administration to see pt. He reports the pt is not a great eater and at baseline just "eats like a bird". He says she did not follow any type of therapeutic diet. She did take a daily mvi. He believes her UBW is 105, though comments it fluctuates with swelling. Acutely, he says the patient developed her n/v last Saturday. This was the last time the patient had any oral intake. Son promptly took patient to ED the next day.   Nutritionally, pt has been NPO her entire admission with exception  of 6/7-6/8 when she was briefly trialed on Heart Healthy Diet (however there is no documented intake during this period). Given she reported being unable to tolerate PO intake for several days PTA, it is likely pt has had essentially no oral nutrition in about a week. Will recommend prompt initiation of EN. NGT was removed yesterday, though currently has OGT in place.   Weight wise, the first measurement in chart that is documented as being measured was taken 6/9- was 108 lbs. She gained up to 115.5 lbs, but has decreased back to 109.5 w/ diureseis.   While RD was speaking with Son, pts BP dropped below safe-feeding threshold of MAP 50, was 44. RN reports pts BP is tenuous. RD messaged MD regarding initiation of TF. MD wants to wait to tomorrow given only recent return of her bowel function. Will place recommendations in anticipation of starting TF tomorrow.   Patient is currently intubated on ventilator support MV: 11.0 L/min Temp (24hrs), Avg:98 F (36.7 C), Min:97.4 F (36.3 C), Max:98.8 F (37.1 C) Propofol: d/cd BP: 89/45 (58)  Labs:  Bgs: 132-141, Albumin: 2.4, WBC: 17.8 (acutely increased) Meds: PPi, IVF, IV abx, Lasix Sedation/Analgesia: Fentanyl  Recent Labs  Lab 03/27/19 0612 03/28/19 0425 03/29/19 0453  NA 140 139 141  K 4.2 4.3 4.6  CL 106 107 108  CO2 '26 24 29  '$ BUN 38* 44* 39*  CREATININE 1.32* 1.14* 0.79  CALCIUM 9.2 9.4 9.8  MG 2.0  --  2.3  PHOS 3.1  --   --  GLUCOSE 89 132* 141*   NUTRITION - FOCUSED PHYSICAL EXAM: Deferred  Diet Order:   Diet Order            Diet NPO time specified Except for: Ice Chips, Sips with Meds  Diet effective now             EDUCATION NEEDS:  No education needs have been identified at this time  Skin:  Skin Assessment: Reviewed RN Assessment  Last BM:  6/12  Height:  Ht Readings from Last 1 Encounters:  03/30/19 '5\' 4"'$  (1.626 m)   Weight:  Wt Readings from Last 1 Encounters:  03/30/19 49.7 kg   Wt Readings from  Last 10 Encounters:  03/30/19 49.7 kg  07/24/14 54.9 kg   Ideal Body Weight:  54.54 kg  BMI:  Body mass index is 18.81 kg/m.  Estimated Nutritional Needs:  Kcal:  1200 (Psu 2003b) Protein:  84-98g Pro (1.7-2g/kg bw) Fluid:  Defer to MD  Burtis Junes RD, LDN, CNSC Clinical Nutrition Available Tues-Sat via Pager: [phone] 03/30/2019 3:14 PM

## 2019-03-30 NOTE — Progress Notes (Signed)
Patient continues to desat into lower 80s even on 100% bipap. Respirations continue to be in the higher 40s to 50s. Dr. Dyann Kief notified. Son at bedside. ABG obtained as well as portable chest xray. 40mg  Lasix was also given.

## 2019-03-30 NOTE — Progress Notes (Signed)
Approximately 0130 patient's O2 saturation began to decrease on 5L HFNC to 82-85 range. Oxygen increased to 7L and saturation increased to 88. Patient also with wheezing before breathing treatment and crackles after. Tachypnea present with respiratory rate up to 50's. MD in to see patient. NG tube removed, no complaints of abdominal pain or nausea/vomiting, bipap placed.

## 2019-03-30 NOTE — Progress Notes (Signed)
PT Cancellation Note  Patient Details Name: Carrie Morales MRN: [id number] DOB: Dec 07, 1927   Cancelled Treatment:    Reason Eval/Treat Not Completed: Medical issues which prohibited therapy. Patient s/p intubation and unable to participate with therapy at this time, will need new PT consult resume therapy when patient is medically stable.  Thank you.  2:25 PM, 03/30/19 Lonell Grandchild, MPT Physical Therapist with [facility] [phone] office [phone] mobile phone

## 2019-03-30 NOTE — Care Management Important Message (Signed)
Important Message  Patient Details  Name: Carrie Morales MRN: [id number] Date of Birth: 1927/11/28   Medicare Important Message Given:  Yes    Tommy Medal 03/30/2019, 2:02 PM

## 2019-03-30 NOTE — Progress Notes (Signed)
[facility] Progress Note     Subjective: Patient seen shortly before her intubation. Had increased work of breathing RR 40-50 and O2 88% range on 100%.  She had no complaints of abdominal pain, NG had been removed overnight for bipap use.  She had 3 BMs yesterday. Discussed high likelihood of intubation with patient and son given CXR with bilateral opacities and ABG with compensated respiratory acidosis and hypoxia. Shortly after patient intubated.   Objective: Vital signs in last 24 hours: Temp:  [97.4 F (36.3 C)-98.5 F (36.9 C)] 98 F (36.7 C) (06/12 0745) Pulse Rate:  [38-157] 76 (06/12 0900) Resp:  [17-54] 53 (06/12 0900) BP: (106-144)/(46-92) 144/92 (06/12 0900) SpO2:  [85 %-95 %] 89 % (06/12 0900) Weight:  [49.7 kg] 49.7 kg (06/12 0500) Last BM Date: 03/29/19  Intake/Output from previous day: 06/11 0701 - 06/12 0700 In: 2238.5 [I.V.:1788.5; IV Piggyback:450] Out: 1751 [Urine:1750; Stool:1] Intake/Output this shift: Total I/O In: 620.8 [I.V.:520.8; IV Piggyback:100] Out: -   General appearance: alert, cooperative and mild distress Resp: Bipap, shallow breathing, RR 40-50 GI: soft, mildly distended, non tender  Lab Results:  Recent Labs    03/29/19 0453 03/30/19 0444  WBC 18.0* 17.8*  HGB 11.8* 12.0  HCT 38.8 40.0  PLT 223 224   BMET Recent Labs    03/28/19 0425 03/29/19 0453  NA 139 141  K 4.3 4.6  CL 107 108  CO2 24 29  GLUCOSE 132* 141*  BUN 44* 39*  CREATININE 1.14* 0.79  CALCIUM 9.4 9.8   PT/INR No results for input(s): LABPROT, INR in the last 72 hours.  Studies/Results: Dg Chest Port 1 View  Result Date: 03/30/2019 CLINICAL DATA:  Shortness of breath. EXAM: PORTABLE CHEST 1 VIEW COMPARISON:  03/27/2019 FINDINGS: The heart is enlarged but stable.  Left PICC line appears stable. Severe and progressive bilateral airspace process. Persistent small pleural effusions. No pneumothorax. IMPRESSION: Severe and progressive bilateral  airspace process. Electronically Signed   By: Rudie MeyerP.  Gallerani M.D.   On: 03/30/2019 10:41   Dg Abd 2 Views  Result Date: 03/30/2019 CLINICAL DATA:  Follow up small bowel obstruction EXAM: ABDOMEN - 2 VIEW COMPARISON:  03/26/2019 FINDINGS: Stable scoliosis is noted. Bibasilar infiltrative changes are seen. A few persistent loops of dilated small bowel are seen. No free air is noted. The overall appearance has improved slightly in the interval from the prior exam. IMPRESSION: Slight improvement in mild small bowel dilatation. No new focal abnormality is seen. Electronically Signed   By: Alcide CleverMark  Lukens M.D.   On: 03/30/2019 08:08    Anti-infectives: Anti-infectives (From admission, onward)   Start     Dose/Rate Route Frequency Ordered Stop   03/27/19 1045  azithromycin (ZITHROMAX) 500 mg in sodium chloride 0.9 % 250 mL IVPB     500 mg 250 mL/hr over 60 Minutes Intravenous Every 24 hours 03/27/19 1038     03/26/19 0900  meropenem (MERREM) 1,000 mg in sodium chloride 0.9 % 100 mL IVPB     1,000 mg 200 mL/hr over 30 Minutes Intravenous Every 12 hours 03/26/19 0814     01/18/2019 2200  cefTRIAXone (ROCEPHIN) 2 g in sodium chloride 0.9 % 100 mL IVPB  Status:  Discontinued     2 g 200 mL/hr over 30 Minutes Intravenous Every 24 hours 01/18/2019 2044 03/26/19 0814   01/18/2019 2100  metroNIDAZOLE (FLAGYL) IVPB 500 mg  Status:  Discontinued     500 mg 100 mL/hr over 60 Minutes  Intravenous Every 8 hours April 14, 2019 2044 03/26/19 1245      Assessment/Plan: Ms. Ammirati is a 85 yo with resolving SBO. Had BMs and NG clamped yesterday and no vomiting or nausea. NG removed for Bipap. Now intubated.  -No surgical intervention for SBO, has been resolving  -Once stable can trial TF through OG but would advance slowly  -Leukocytosis from PNA, no indication from the abdomen as she has no pain  -Encouraged patient and son to have plan for what comes after intubation/ how long they would want to be intubated. They had  previously told me no to tracheostomy.    LOS: 5 days    Virl Cagey 03/30/2019

## 2019-03-30 NOTE — Consult Note (Signed)
Consult requested by: [facility] hospitalist, Dr. Dyann Kief Consult requested STM:[id number] failure:  HPI: This is an 85 year old who came to the hospital with a high-grade small bowel obstruction aspiration pneumonia sepsis related to that chronic A. fib.  She was admitted on the seventh of this month she had A. fib with RVR had pneumonia presumably from aspiration was septic with a temperature of 102.8 and had the high-grade small bowel obstruction.  She has been treated with antibiotics conservatively has had surgical consultation and had done better until early this morning when her oxygen saturation began to decrease.  She was on a high flow nasal cannula at 5 at that time she was increased to 7 L but continued having trouble and had fairly marked tachypnea.  Nasogastric tube was removed she was not noticed to have vomited she was started on BiPAP but she has continued deterioration.  She required intubation and mechanical ventilation.  Her PO2 was in the 40s on 100% oxygen and BiPAP.  Chest x-ray is markedly abnormal with essentially totally whited out left lung substantial infiltrate on the right.  She is still breathing over the ventilator.  She is hypotensive and she is on propofol so I think we will need to change that.  Chest x-ray after intubation shows that the endotracheal tube is only about 1.5 cm above the carina so it will be pulled back.  On propofol she is still reaching for the endotracheal tube.  The history is from the medical record as she is intubated and on the ventilator. Past Medical History:  Diagnosis Date  . Colonic diverticular abscess   . Essential hypertension   . Osteopenia    09/2014 tab T score -1.1  . Permanent atrial fibrillation    Diagnosed December 2019 - Dr. Darral Dash     Family History  Problem Relation Age of Onset  . Cancer Brother        Lung  . Cancer Brother        Bladder     Social History   Socioeconomic History  . Marital status: Married     Spouse name: Not on file  . Number of children: Not on file  . Years of education: Not on file  . Highest education level: Not on file  Occupational History  . Not on file  Social Needs  . Financial resource strain: Not on file  . Food insecurity    Worry: Not on file    Inability: Not on file  . Transportation needs    Medical: Not on file    Non-medical: Not on file  Tobacco Use  . Smoking status: Former Research scientist (life sciences)  . Smokeless tobacco: Never Used  Substance and Sexual Activity  . Alcohol use: Not Currently    Comment: Occas  . Drug use: Not on file  . Sexual activity: Never  Lifestyle  . Physical activity    Days per week: Not on file    Minutes per session: Not on file  . Stress: Not on file  Relationships  . Social Herbalist on phone: Not on file    Gets together: Not on file    Attends religious service: Not on file    Active member of club or organization: Not on file    Attends meetings of clubs or organizations: Not on file    Relationship status: Not on file  Other Topics Concern  . Not on file  Social History Narrative  . Not on file  ROS: Unable to obtain    Objective: Vital signs in last 24 hours: Temp:  [97.4 F (36.3 C)-98.5 F (36.9 C)] 98 F (36.7 C) (06/12 0745) Pulse Rate:  [38-157] 134 (06/12 1218) Resp:  [17-54] 34 (06/12 1218) BP: (85-144)/(46-92) 85/74 (06/12 1200) SpO2:  [80 %-98 %] 93 % (06/12 1218) Weight:  [49.7 kg] 49.7 kg (06/12 0500) Weight change: -2.7 kg Last BM Date: 03/29/19  Intake/Output from previous day: 06/11 0701 - 06/12 0700 In: 2238.5 [I.V.:1788.5; IV Piggyback:450] Out: 8841 [Urine:1750; Stool:1]  PHYSICAL EXAM Constitutional: She is thin.  She is intubated and on the ventilator.  Eyes: Pupils react ears nose mouth and throat: She has endotracheal and orogastric tube in place.  Cardiovascular: She seems to still be in atrial fib heart rate in the 90s.  Respiratory: Her respiratory effort is  increased.  She has diffuse rales and rhonchi.  No wheezing right now.  Gastrointestinal: Her abdomen is fairly soft.  Musculoskeletal: Cannot assess.  Neurological: Cannot assess.  Psychiatric: Cannot assess  Lab Results: Basic Metabolic Panel: Recent Labs    03/28/19 0425 03/29/19 0453  NA 139 141  K 4.3 4.6  CL 107 108  CO2 24 29  GLUCOSE 132* 141*  BUN 44* 39*  CREATININE 1.14* 0.79  CALCIUM 9.4 9.8  MG  --  2.3   Liver Function Tests: Recent Labs    03/28/19 0425  AST 16  ALT 16  ALKPHOS 53  BILITOT 1.2  PROT 5.2*  ALBUMIN 2.4*   No results for input(s): LIPASE, AMYLASE in the last 72 hours. No results for input(s): AMMONIA in the last 72 hours. CBC: Recent Labs    03/29/19 0453 03/30/19 0444  WBC 18.0* 17.8*  HGB 11.8* 12.0  HCT 38.8 40.0  MCV 98.5 99.5  PLT 223 224   Cardiac Enzymes: No results for input(s): CKTOTAL, CKMB, CKMBINDEX, TROPONINI in the last 72 hours. BNP: No results for input(s): PROBNP in the last 72 hours. D-Dimer: No results for input(s): DDIMER in the last 72 hours. CBG: No results for input(s): GLUCAP in the last 72 hours. Hemoglobin A1C: No results for input(s): HGBA1C in the last 72 hours. Fasting Lipid Panel: No results for input(s): CHOL, HDL, LDLCALC, TRIG, CHOLHDL, LDLDIRECT in the last 72 hours. Thyroid Function Tests: No results for input(s): TSH, T4TOTAL, FREET4, T3FREE, THYROIDAB in the last 72 hours. Anemia Panel: No results for input(s): VITAMINB12, FOLATE, FERRITIN, TIBC, IRON, RETICCTPCT in the last 72 hours. Coagulation: No results for input(s): LABPROT, INR in the last 72 hours. Urine Drug Screen: Drugs of Abuse  No results found for: LABOPIA, COCAINSCRNUR, LABBENZ, AMPHETMU, THCU, LABBARB  Alcohol Level: No results for input(s): ETH in the last 72 hours. Urinalysis: No results for input(s): COLORURINE, LABSPEC, PHURINE, GLUCOSEU, HGBUR, BILIRUBINUR, KETONESUR, PROTEINUR, UROBILINOGEN, NITRITE, LEUKOCYTESUR  in the last 72 hours.  Invalid input(s): APPERANCEUR Misc. Labs:   ABGS: Recent Labs    03/30/19 0913  PHART 7.363  PO2ART 43.1*  HCO3 27.0     MICROBIOLOGY: Recent Results (from the past 240 hour(s))  SARS Coronavirus 2 (CEPHEID - Performed in [place] hospital lab), Hosp Order     Status: None   Collection Time: 04/01/2019  2:26 PM   Specimen: Nasopharyngeal Swab  Result Value Ref Range Status   SARS Coronavirus 2 NEGATIVE NEGATIVE Final    Comment: (NOTE) If result is NEGATIVE SARS-CoV-2 target nucleic acids are NOT DETECTED. The SARS-CoV-2 RNA is generally detectable in upper and  lower  respiratory specimens during the acute phase of infection. The lowest  concentration of SARS-CoV-2 viral copies this assay can detect is 250  copies / mL. A negative result does not preclude SARS-CoV-2 infection  and should not be used as the sole basis for treatment or other  patient management decisions.  A negative result may occur with  improper specimen collection / handling, submission of specimen other  than nasopharyngeal swab, presence of viral mutation(s) within the  areas targeted by this assay, and inadequate number of viral copies  (<250 copies / mL). A negative result must be combined with clinical  observations, patient history, and epidemiological information. If result is POSITIVE SARS-CoV-2 target nucleic acids are DETECTED. The SARS-CoV-2 RNA is generally detectable in upper and lower  respiratory specimens dur ing the acute phase of infection.  Positive  results are indicative of active infection with SARS-CoV-2.  Clinical  correlation with patient history and other diagnostic information is  necessary to determine patient infection status.  Positive results do  not rule out bacterial infection or co-infection with other viruses. If result is PRESUMPTIVE POSTIVE SARS-CoV-2 nucleic acids MAY BE PRESENT.   A presumptive positive result was obtained on the  submitted specimen  and confirmed on repeat testing.  While 2019 novel coronavirus  (SARS-CoV-2) nucleic acids may be present in the submitted sample  additional confirmatory testing may be necessary for epidemiological  and / or clinical management purposes  to differentiate between  SARS-CoV-2 and other Sarbecovirus currently known to infect humans.  If clinically indicated additional testing with an alternate test  methodology [phone]) is advised. The SARS-CoV-2 RNA is generally  detectable in upper and lower respiratory sp ecimens during the acute  phase of infection. The expected result is Negative. Fact Sheet for Patients:  StrictlyIdeas.no Fact Sheet for Healthcare Providers: BankingDealers.co.za This test is not yet approved or cleared by the [place] FDA and has been authorized for detection and/or diagnosis of SARS-CoV-2 by FDA under an Emergency Use Authorization (EUA).  This EUA will remain in effect (meaning this test can be used) for the duration of the COVID-19 declaration under Section 564(b)(1) of the Act, 21 U.S.C. section 360bbb-3(b)(1), unless the authorization is terminated or revoked sooner. Performed at [facility], [address] [place], [place] [address]   MRSA PCR Screening     Status: None   Collection Time: 03/28/2019  7:54 PM   Specimen: Nasal Mucosa; Nasopharyngeal  Result Value Ref Range Status   MRSA by PCR NEGATIVE NEGATIVE Final    Comment:        The GeneXpert MRSA Assay (FDA approved for NASAL specimens only), is one component of a comprehensive MRSA colonization surveillance program. It is not intended to diagnose MRSA infection nor to guide or monitor treatment for MRSA infections. Performed at [facility], [address], [place], [place] [address]   Culture, blood (Routine X 2) w Reflex to ID Panel     Status: None   Collection Time: 03/28/2019  9:50 PM   Specimen: Left Antecubital;  Blood  Result Value Ref Range Status   Specimen Description LEFT ANTECUBITAL  Final   Special Requests   Final    BOTTLES DRAWN AEROBIC AND ANAEROBIC Blood Culture adequate volume   Culture   Final    NO GROWTH 5 DAYS Performed at [facility], [address] [place], [place] [address]    Report Status 03/30/2019 FINAL  Final    Studies/Results: Dg Chest  Port 1 View  Result Date: 03/30/2019 CLINICAL DATA:  Shortness of breath. EXAM: PORTABLE CHEST 1 VIEW COMPARISON:  03/27/2019 FINDINGS: The heart is enlarged but stable.  Left PICC line appears stable. Severe and progressive bilateral airspace process. Persistent small pleural effusions. No pneumothorax. IMPRESSION: Severe and progressive bilateral airspace process. Electronically Signed   By: Marijo Sanes M.D.   On: 03/30/2019 10:41   Dg Chest Port 1v Same Day  Result Date: 03/30/2019 CLINICAL DATA:  Status post intubation and OG tube placement today. EXAM: PORTABLE CHEST 1 VIEW COMPARISON:  Single-view of the chest earlier today. FINDINGS: Endotracheal tube tip is 1.5 cm above the carina. The tube could be withdrawn 1-2 cm for better positioning. OG tube courses into the stomach and below the inferior margin the film. Severe and diffuse bilateral airspace disease is again seen. Cardiac silhouette is obscured. No pneumothorax is identified. IMPRESSION: ETT tip is 1.5 cm above the carina. The tube could be withdrawn 1-2 cm. OG tube courses into the stomach and below the inferior margin the film. No change in diffuse bilateral airspace disease. Electronically Signed   By: Inge Rise M.D.   On: 03/30/2019 11:53   Dg Abd 2 Views  Result Date: 03/30/2019 CLINICAL DATA:  Follow up small bowel obstruction EXAM: ABDOMEN - 2 VIEW COMPARISON:  03/26/2019 FINDINGS: Stable scoliosis is noted. Bibasilar infiltrative changes are seen. A few persistent loops of dilated small bowel are seen. No free air is noted. The overall appearance has improved  slightly in the interval from the prior exam. IMPRESSION: Slight improvement in mild small bowel dilatation. No new focal abnormality is seen. Electronically Signed   By: Inez Catalina M.D.   On: 03/30/2019 08:08    Medications:  Prior to Admission:  Medications Prior to Admission  Medication Sig Dispense Refill Last Dose  . apixaban (ELIQUIS) 2.5 MG TABS tablet Take 2.5 mg by mouth 2 (two) times daily.    03/24/2019 at Unknown time  . aspirin 81 MG tablet Take 81 mg by mouth daily.   unknown  . Calcium Carbonate-Vitamin D (CALCIUM + D PO) Take 1 tablet by mouth daily.    unknown  . digoxin (LANOXIN) 0.125 MG tablet Take 0.125 mg by mouth daily.   03/24/2019 at Unknown time  . furosemide (LASIX) 20 MG tablet Take 20 mg by mouth See admin instructions. Take one tablet by mouth daily 5 days per week   03/23/2019 at Unknown time  . losartan (COZAAR) 50 MG tablet Take 50 mg by mouth daily.    03/24/2019 at Unknown time  . metoprolol tartrate (LOPRESSOR) 100 MG tablet Take 100 mg by mouth 2 (two) times daily.    03/24/2019 at Unknown time  . Multiple Vitamin (MULTIVITAMIN) tablet Take 1 tablet by mouth daily.   03/24/2019  . ondansetron (ZOFRAN) 4 MG tablet Take 4 mg by mouth daily as needed for nausea or vomiting.   03/24/2019 at Unknown time  . potassium chloride (K-DUR) 10 MEQ tablet Take 10 mEq by mouth See admin instructions. Take one tablet daily by mouth 5 days per week   03/23/2019 at Unknown time  . Cholecalciferol (VITAMIN D PO) Take 1 capsule by mouth daily.    03/24/2019   Scheduled: . chlorhexidine gluconate (MEDLINE KIT)  15 mL Mouth Rinse BID  . Chlorhexidine Gluconate Cloth  6 each Topical Daily  . furosemide  40 mg Intravenous BID  . mouth rinse  15 mL Mouth Rinse 10 times per  day  . metoprolol tartrate  2.5 mg Intravenous Q8H  . pantoprazole (PROTONIX) IV  40 mg Intravenous Q24H  . sodium chloride flush  10-40 mL Intracatheter Q12H  . sodium chloride flush  3 mL Intravenous Q12H    Continuous: . sodium chloride 250 mL (03/26/19 0934)  . dextrose 5% lactated ringers with KCl 20 mEq/L 75 mL/hr at 03/30/19 1218  . diltiazem (CARDIZEM) infusion 5 mg/hr (03/30/19 1218)  . heparin 700 Units/hr (03/30/19 1242)  . meropenem [facility]) IV Stopped (03/30/19 0826)   FEX:MDYJWL chloride, acetaminophen **OR** acetaminophen, albuterol, bisacodyl, fentaNYL (SUBLIMAZE) injection, fentaNYL (SUBLIMAZE) injection, midazolam, midazolam, ondansetron **OR** ondansetron (ZOFRAN) IV, phenol, polyethylene glycol, sodium chloride flush, sodium chloride flush, traZODone  Assesment: She was admitted with pneumonia presumably from aspiration.  She was septic on admission.  She also had atrial fib with RVR.  She is felt to have acute diastolic heart failure.  She now has progressive respiratory failure with both hypoxia and hypercapnia.  She has been intubated and placed on mechanical ventilation.  She has ARDS but I think she definitely is volume overloaded as well  She is somewhat hypotensive so I think we need to switch her off of propofol.  Current antibiotic should cover aspiration pneumonia  Her endotracheal tube is only 1.5 cm from the carina so it has been moved back another 1.5 cm.  I think it is unlikely that she will survive this Principal Problem:   High Grade SBO (small bowel obstruction)  Active Problems:   Essential hypertension   Chronic a-fib   PNA (pneumonia)--Aspiration   Acute respiratory failure with hypoxia (HCC)   Acute diastolic CHF (congestive heart failure) (HCC)   Atrial fibrillation with RVR (HCC)   Elevated troponin   Lobar pneumonia (HCC)    Plan: Use ARDS protocol ventilation.  She may require pressors.  The endotracheal tube has been repositioned diurese if possible continue antibiotics    LOS: 5 days   Alonza Bogus 03/30/2019, 12:38 PM

## 2019-03-30 NOTE — Anesthesia Procedure Notes (Signed)
Procedure Name: Intubation Date/Time: 03/30/2019 11:22 AM Performed by: Charmaine Downs, CRNA Pre-anesthesia Checklist: Patient identified, Emergency Drugs available, Suction available, Patient being monitored and Timeout performed Patient Re-evaluated:Patient Re-evaluated prior to induction Oxygen Delivery Method: Ambu bag Preoxygenation: Pre-oxygenation with 100% oxygen Induction Type: IV induction, Rapid sequence and Cricoid Pressure applied Laryngoscope Size: Mac and 3 Grade View: Grade III Tube type: Oral Tube size: 7.0 mm Number of attempts: 1 Airway Equipment and Method: Stylet Placement Confirmation: CO2 detector,  breath sounds checked- equal and bilateral and positive ETCO2 Secured at: 22 cm Tube secured with: Tape Dental Injury: Teeth and Oropharynx as per pre-operative assessment  Difficulty Due To: Difficulty was anticipated, Difficult Airway- due to limited oral opening and Difficult Airway- due to anterior larynx Future Recommendations: Recommend- induction with short-acting agent, and alternative techniques readily available

## 2019-03-30 NOTE — Progress Notes (Signed)
Updated patient's son, Tressie Ellis, over phone about patient's condition and changes over night.

## 2019-03-30 NOTE — TOC Progression Note (Signed)
Transition of Care [facility]) - Progression Note    Patient Details  Name: Carrie Morales MRN: [id number] Date of Birth: [birth year]  [facility] CM/SW Contact  Shade Flood, LCSW Phone Number: 03/30/2019, 11:03 AM  Clinical Narrative:     Pt discussed with MD in Progression today. Per MD, pt's respiratory status has declined and she may need to be intubated. TOC will follow and assist as pt's stay progresses.  Expected Discharge Plan: [place] Barriers to Discharge: No Barriers Identified  Expected Discharge Plan and Services Expected Discharge Plan: [place]   Discharge Planning Services: CM Consult Post Acute Care Choice: [place] arrangements for the past 2 months: Single Family Home                                       Social Determinants of Health (SDOH) Interventions    Readmission Risk Interventions No flowsheet data found.

## 2019-03-30 NOTE — Progress Notes (Signed)
ANTICOAGULATION CONSULT NOTE - Follow Up Consult  Pharmacy Consult for heparin Indication: atrial fibrillation  Labs: Recent Labs    03/28/19 0425 03/29/19 0453 03/30/19 0444 03/30/19 1457  HGB 11.3* 11.8* 12.0  --   HCT 36.9 38.8 40.0  --   PLT 191 223 224  --   HEPARINUNFRC 0.68 0.36 <0.10* <0.10*  CREATININE 1.14* 0.79  --   --     Assessment: 85yo female now undetectable on heparin as PTA Eliquis has likely cleared; no gtt issues or signs of bleeding per RN.  Goal of Therapy:  Heparin level 0.3-0.7 units/ml   Plan:  Re-bolus heparin 1500 units x 1 Increase heparin infusion to 900 units/hr Recheck heparin level in 8 hours and daily.  Margot Ables, PharmD Clinical Pharmacist 03/30/2019 5:50 PM

## 2019-03-30 NOTE — Progress Notes (Signed)
ANTICOAGULATION CONSULT NOTE - Follow Up Consult  Pharmacy Consult for heparin Indication: atrial fibrillation  Labs: Recent Labs    03/28/19 0425 03/29/19 0453 03/30/19 0444  HGB 11.3* 11.8* 12.0  HCT 36.9 38.8 40.0  PLT 191 223 224  HEPARINUNFRC 0.68 0.36 <0.10*  CREATININE 1.14* 0.79  --     Assessment: 85yo female now undetectable on heparin as PTA Eliquis has likely cleared; no gtt issues or signs of bleeding per RN.  Goal of Therapy:  Heparin level 0.3-0.7 units/ml   Plan:  Will give heparin bolus of 1000 units and increase heparin gtt by 4 units/kg/hr to 700 units/hr and check level in 8 hours.    Wynona Neat, PharmD, BCPS  03/30/2019,6:56 AM

## 2019-03-30 NOTE — Progress Notes (Addendum)
Progress Note  Patient Name: Carrie Morales Date of Encounter: 03/30/2019  Primary Cardiologist: Laural Goldenavid Kotlaba, MD [facility]([place])  Subjective   This am, pt w/ increased WOB, decreased O2 sats, CXR w/ increased airspace dz>>pt intubated  Inpatient Medications    Scheduled Meds: . Chlorhexidine Gluconate Cloth  6 each Topical Daily  . furosemide  20 mg Intravenous Daily  . mouth rinse  15 mL Mouth Rinse q12n4p  . metoprolol tartrate  2.5 mg Intravenous Q8H  . pantoprazole (PROTONIX) IV  40 mg Intravenous Q24H  . sodium chloride flush  10-40 mL Intracatheter Q12H  . sodium chloride flush  3 mL Intravenous Q12H   Continuous Infusions: . sodium chloride 250 mL (03/26/19 0934)  . azithromycin 500 mg (03/30/19 0946)  . dextrose 5% lactated ringers with KCl 20 mEq/L 75 mL/hr at 03/30/19 0856  . diltiazem (CARDIZEM) infusion 12.5 mg/hr (03/30/19 0952)  . heparin 700 Units/hr (03/30/19 0856)  . meropenem (MERREM) IV Stopped (03/30/19 0826)   PRN Meds: sodium chloride, acetaminophen **OR** acetaminophen, albuterol, bisacodyl, ondansetron **OR** ondansetron (ZOFRAN) IV, phenol, polyethylene glycol, sodium chloride flush, sodium chloride flush, traZODone   Vital Signs    Vitals:   03/30/19 0900 03/30/19 1000 03/30/19 1100 03/30/19 1127  BP: (!) 144/92 132/62 (!) 93/59   Pulse: 76 86 81   Resp: (!) 53 (!) 50 (!) 52   Temp:      TempSrc:      SpO2: (!) 89% (!) 85% (!) 80% 98%  Weight:      Height:    5\' 4"  (1.626 m)    Intake/Output Summary (Last 24 hours) at 03/30/2019 1146 Last data filed at 03/30/2019 0952 Gross per 24 hour  Intake 2429.98 ml  Output 1750 ml  Net 679.98 ml   Filed Weights   03/28/19 0500 03/29/19 0405 03/30/19 0500  Weight: 49.5 kg 52.4 kg 49.7 kg    Telemetry    Afib, rate generally controlled  Personally reviewed.  Physical Exam   General: frail, elderly, female in no acute distress Head: Eyes PERRLA, No xanthomas.   Normocephalic and  atraumatic Lungs: bilateral rales and rhonchi. Heart: Irreg R&r S1 S2, without MRG.  Pulses are 2+ & equal. No JVD seen, difficult to assess 2nd equipment. Abdomen: Bowel sounds are present, abdomen soft and non-tender without masses or  hernias noted. Msk: unable to test Extremities: No clubbing, cyanosis, has some edema and ecchymosis L forearm, nursing aware Skin:  No rashes or lesions noted.   Labs    Chemistry Recent Labs  Lab 2019/08/10 1117  03/27/19 0612 03/28/19 0425 03/29/19 0453  NA 138   < > 140 139 141  K 4.5   < > 4.2 4.3 4.6  CL 95*   < > 106 107 108  CO2 27   < > 26 24 29   GLUCOSE 163*   < > 89 132* 141*  BUN 20   < > 38* 44* 39*  CREATININE 0.84   < > 1.32* 1.14* 0.79  CALCIUM 10.3   < > 9.2 9.4 9.8  PROT 7.5  --  5.3* 5.2*  --   ALBUMIN 4.0  --  2.5* 2.4*  --   AST 25  --  18 16  --   ALT 25  --  17 16  --   ALKPHOS 67  --  40 53  --   BILITOT 1.5*  --  1.0 1.2  --   GFRNONAA >60   < >  36* 43* >60  GFRAA >60   < > 42* 50* >60  ANIONGAP 16*   < > 8 8 4*   < > = values in this interval not displayed.     Hematology Recent Labs  Lab 03/28/19 0425 03/29/19 0453 03/30/19 0444  WBC 18.1* 18.0* 17.8*  RBC 3.78* 3.94 4.02  HGB 11.3* 11.8* 12.0  HCT 36.9 38.8 40.0  MCV 97.6 98.5 99.5  MCH 29.9 29.9 29.9  MCHC 30.6 30.4 30.0  RDW 14.1 14.2 14.2  PLT 191 223 224    Cardiac Enzymes Recent Labs  Lab 11-26-18 1117  TROPONINI 0.03*      BNP Recent Labs  Lab 03/26/19 0830  BNP 950.0*     Radiology    Dg Chest Port 1 View  Result Date: 03/30/2019 CLINICAL DATA:  Shortness of breath. EXAM: PORTABLE CHEST 1 VIEW COMPARISON:  03/27/2019 FINDINGS: The heart is enlarged but stable.  Left PICC line appears stable. Severe and progressive bilateral airspace process. Persistent small pleural effusions. No pneumothorax. IMPRESSION: Severe and progressive bilateral airspace process. Electronically Signed   By: Rudie MeyerP.  Gallerani M.D.   On: 03/30/2019 10:41    Dg Abd 2 Views  Result Date: 03/30/2019 CLINICAL DATA:  Follow up small bowel obstruction EXAM: ABDOMEN - 2 VIEW COMPARISON:  03/26/2019 FINDINGS: Stable scoliosis is noted. Bibasilar infiltrative changes are seen. A few persistent loops of dilated small bowel are seen. No free air is noted. The overall appearance has improved slightly in the interval from the prior exam. IMPRESSION: Slight improvement in mild small bowel dilatation. No new focal abnormality is seen. Electronically Signed   By: Alcide CleverMark  Lukens M.D.   On: 03/30/2019 08:08    Cardiac Studies   Echocardiogram 03/26/2019: 1. The left ventricle has hyperdynamic systolic function, with an ejection fraction of >65%. The cavity size was normal. There is moderate asymmetric left ventricular hypertrophy with sigmoid shaped septum. Left ventricular diastolic Doppler parameters  are indeterminate in the setting of atrial fibrillation. No evidence of left ventricular regional wall motion abnormalities. 2. The right ventricle has normal systolic function. The cavity was normal. There is no increase in right ventricular wall thickness. Right ventricular systolic pressure is mildly elevated with an estimated pressure of 40.1 mmHg. 3. Left atrial size was severely dilated. 4. Right atrial size was mildly dilated. 5. Moderate pericardial effusion. 6. The pericardial effusion is anterior to the right ventricle. 7. The mitral valve is grossly normal. Mild thickening of the mitral valve leaflet. There is mild mitral annular calcification present. Mitral valve regurgitation is moderate by color flow Doppler. The MR jet is posteriorly-directed. 8. The aortic valve is tricuspid. Mild calcification of the aortic valve. Mild aortic annular calcification noted. There is increased LVOT/transaortic velocity that reflects small LVOT with turbulent flow and mitral SAM, although not clearly aortic  stenosis. 9. The tricuspid valve is grossly normal. 10. The  aortic root is normal in size and structure. 11. The inferior vena cava was normal in size with <50% respiratory variability.  Patient Profile     85 y.o. female with a history of permanent atrial fibrillation diagnosed in December 2019 (also intermittent atrial flutter based on records) andhypertension admitted 06/07 with small bowel obstruction and sepsis secondary to pneumonia.   Assessment & Plan    1.  Permanent atrial fibrillation and intermittent atrial flutter diagnosed in 2019, followed [place] Dr. [place]  - HR 60s-100s on current rx - with worsening resp status,  would make goal HR 90-110, feel her BP would run a little higher w/ less aggressive meds and that would help w/ diuresis - CHA2DS2-VASc = 4 (age x 2, female, HTN), continue heparin till can resume Eliquis  2.  Heart murmur secondary to hyperdynamic LVEF with small LVOT and turbulent flow associated with anterior systolic motion of the mitral valve. - see echo report above - no additional eval needed at this time - need to watch volume, currently wet, but must not get too dry  3.  Small bowel obstruction, NG tube remains in place with bowel rest.  - SBO has improved, per IM/Surgery  4.  Sepsis with pneumonia - ABX per IM   5.  Incidentally noted coronary and aortic calcifications by CT imaging. - no further eval at this time, f/u as outpt  6. Acute diastolic CHF:  - Lasix 20 mg qd started on 06/10 - However, I/O still positive each day - I/O net +4.9 L since admission - increase Lasix to 40 mg IV BID -  IV intake has been > 2 L daily, need to decrease this to 1.5 L or less - decrease IVF to 50 cc/hr, may have to cut back further if this does not pull fluid off - continue to follow daily wts, strict I/O  Signed, Rosaria Ferries, PA-C  03/30/2019, 11:46 AM    The patient was seen and examined, and I agree with the history, physical exam, assessment and plan as documented above. I have also personally  reviewed all relevant documentation, old records, labs, and both radiographic and cardiovascular studies. I have also independently interpreted old and new ECG's.  This morning she had increased work of breathing with hypoxia on 100% BiPAP and tachypnea, eventually requiring intubation. She was given 40 mg Lasix.  CXR showed severe and progressive bilateral airspace process (ARDS).  HR controlled on IV diltiazem 5 mg/hr. SBP basically in high 90/low 100 range. If pressures continue to drop, will need to stop diltiazem and consider pressors.  On IV heparin for anticoagulation.  She is positive nearly 5L and had been receiving over 2L IV fluids daily.  Will increase IV Lasix to 40 mg bid for decompensated diastolic heart failure. Would minimize IV fluids as much as possible.  She is also being treated for sepsis from pneumonia. Surgery has said no need for surgical intervention as SBO has been gradually resolving.  Time spent: 30 minutes  Kate Sable, MD, [facility]  03/30/2019 12:36 PM

## 2019-03-31 ENCOUNTER — Inpatient Hospital Stay (HOSPITAL_COMMUNITY): Payer: Medicare Other

## 2019-03-31 LAB — GLUCOSE, CAPILLARY
Glucose-Capillary: 103 mg/dL — ABNORMAL HIGH (ref 70–99)
Glucose-Capillary: 114 mg/dL — ABNORMAL HIGH (ref 70–99)
Glucose-Capillary: 133 mg/dL — ABNORMAL HIGH (ref 70–99)
Glucose-Capillary: 135 mg/dL — ABNORMAL HIGH (ref 70–99)
Glucose-Capillary: 139 mg/dL — ABNORMAL HIGH (ref 70–99)
Glucose-Capillary: 93 mg/dL (ref 70–99)

## 2019-03-31 LAB — BASIC METABOLIC PANEL
Anion gap: 7 (ref 5–15)
BUN: 35 mg/dL — ABNORMAL HIGH (ref 8–23)
CO2: 27 mmol/L (ref 22–32)
Calcium: 9.6 mg/dL (ref 8.9–10.3)
Chloride: 112 mmol/L — ABNORMAL HIGH (ref 98–111)
Creatinine, Ser: 0.81 mg/dL (ref 0.44–1.00)
GFR calc Af Amer: >60 mL/min (ref >60)
GFR calc non Af Amer: >60 mL/min (ref >60)
Glucose, Bld: 318 mg/dL — ABNORMAL HIGH (ref 70–99)
Potassium: 6.1 mmol/L — ABNORMAL HIGH (ref 3.5–5.1)
Sodium: 146 mmol/L — ABNORMAL HIGH (ref 135–145)

## 2019-03-31 LAB — BLOOD GAS, ARTERIAL
Acid-Base Excess: 5.2 mmol/L — ABNORMAL HIGH (ref 0.0–2.0)
Bicarbonate: 27.7 mmol/L (ref 20.0–28.0)
FIO2: 90
O2 Saturation: 86.4 %
Patient temperature: 37
pCO2 arterial: 59.7 mmHg — ABNORMAL HIGH (ref 32.0–48.0)
pH, Arterial: 7.333 — ABNORMAL LOW (ref 7.350–7.450)
pO2, Arterial: 58.9 mmHg — ABNORMAL LOW (ref 83.0–108.0)

## 2019-03-31 LAB — CBC
HCT: 36.8 % (ref 36.0–46.0)
Hemoglobin: 11 g/dL — ABNORMAL LOW (ref 12.0–15.0)
MCH: 30.1 pg (ref 26.0–34.0)
MCHC: 29.9 g/dL — ABNORMAL LOW (ref 30.0–36.0)
MCV: 100.5 fL — ABNORMAL HIGH (ref 80.0–100.0)
Platelets: 168 10*3/uL (ref 150–400)
RBC: 3.66 MIL/uL — ABNORMAL LOW (ref 3.87–5.11)
RDW: 14.5 % (ref 11.5–15.5)
WBC: 14.9 10*3/uL — ABNORMAL HIGH (ref 4.0–10.5)
nRBC: 0 % (ref 0.0–0.2)

## 2019-03-31 LAB — PHOSPHORUS
Phosphorus: 2.7 mg/dL (ref 2.5–4.6)
Phosphorus: 3 mg/dL (ref 2.5–4.6)

## 2019-03-31 LAB — MAGNESIUM
Magnesium: 2.1 mg/dL (ref 1.7–2.4)
Magnesium: 2.1 mg/dL (ref 1.7–2.4)

## 2019-03-31 LAB — HEPARIN LEVEL (UNFRACTIONATED)
Heparin Unfractionated: 0.22 IU/mL — ABNORMAL LOW (ref 0.30–0.70)
Heparin Unfractionated: 0.22 IU/mL — ABNORMAL LOW (ref 0.30–0.70)
Heparin Unfractionated: <0.1 IU/mL — ABNORMAL LOW (ref 0.30–0.70)

## 2019-03-31 MED ORDER — METHYLPREDNISOLONE SODIUM SUCC 40 MG IJ SOLR
40.0000 mg | Freq: Two times a day (BID) | INTRAMUSCULAR | Status: DC
  Administered 2019-03-31 – 2019-04-06 (×14): 40 mg via INTRAVENOUS
  Filled 2019-03-31 (×14): qty 1

## 2019-03-31 MED ORDER — AMIODARONE HCL IN DEXTROSE 360-4.14 MG/200ML-% IV SOLN
30.0000 mg/h | INTRAVENOUS | Status: DC
  Administered 2019-03-31 – 2019-04-03 (×8): 30 mg/h via INTRAVENOUS
  Filled 2019-03-31 (×8): qty 200

## 2019-03-31 MED ORDER — AMIODARONE HCL IN DEXTROSE 360-4.14 MG/200ML-% IV SOLN
60.0000 mg/h | INTRAVENOUS | Status: AC
  Administered 2019-03-31 (×2): 60 mg/h via INTRAVENOUS
  Filled 2019-03-31: qty 200

## 2019-03-31 MED ORDER — AMIODARONE LOAD VIA INFUSION
150.0000 mg | Freq: Once | INTRAVENOUS | Status: AC
  Administered 2019-03-31: 150 mg via INTRAVENOUS
  Filled 2019-03-31: qty 83.34

## 2019-03-31 MED ORDER — VITAL HIGH PROTEIN PO LIQD
1000.0000 mL | ORAL | Status: DC
  Administered 2019-03-31 – 2019-04-02 (×5): 1000 mL
  Filled 2019-03-31 (×5): qty 1000

## 2019-03-31 NOTE — Progress Notes (Signed)
[facility] Progress Note     Subjective: Tachycardia overnight. OG in place, no output recorded. No BM yesterday. CXR this am with bilateral infiltrate. Dr. Juanetta GoslingHawkins is starting steroids and amiodarone.   Objective: Vital signs in last 24 hours: Temp:  [97.5 F (36.4 C)-99.1 F (37.3 C)] 97.5 F (36.4 C) (06/13 0838) Pulse Rate:  [44-152] 73 (06/13 0500) Resp:  [18-52] 26 (06/13 0500) BP: (62-122)/(38-81) 104/65 (06/13 0500) SpO2:  [80 %-100 %] 96 % (06/13 0853) FiO2 (%):  [90 %-100 %] 100 % (06/13 0853) Last BM Date: 03/29/19  Intake/Output from previous day: 06/12 0701 - 06/13 0700 In: 3008 [I.V.:2139.4; IV Piggyback:443.6] Out: -  Intake/Output this shift: No intake/output data recorded.  General appearance: alert and no distress Resp: intubated GI: soft, mildly distended, nontender  Lab Results:  Recent Labs    03/30/19 0444 03/31/19 0217  WBC 17.8* 14.9*  HGB 12.0 11.0*  HCT 40.0 36.8  PLT 224 168   BMET Recent Labs    03/29/19 0453 03/31/19 0217  NA 141 146*  K 4.6 6.1*  CL 108 112*  CO2 29 27  GLUCOSE 141* 318*  BUN 39* 35*  CREATININE 0.79 0.81  CALCIUM 9.8 9.6   PT/INR No results for input(s): LABPROT, INR in the last 72 hours.  Studies/Results: Dg Chest Port 1 View  Result Date: 03/31/2019 CLINICAL DATA:  Respiratory failure. EXAM: PORTABLE CHEST 1 VIEW COMPARISON:  03/30/2019 FINDINGS: The patient is rotated to the left, and the patient's chin partially obscures the left lung apex. An endotracheal tube remains in place terminating approximately 1.5 cm of above the carina, not significantly changed. An enteric tube courses into the abdomen with tip not imaged. A left PICC terminates over the right atrium. There is persistent extensive airspace opacity throughout both lungs, stable to minimally improved. IMPRESSION: Stable to minimally improved extensive bilateral airspace disease. Electronically Signed   By: Sebastian AcheAllen  Grady M.D.    On: 03/31/2019 09:39   Dg Chest Port 1 View  Result Date: 03/30/2019 CLINICAL DATA:  Shortness of breath. EXAM: PORTABLE CHEST 1 VIEW COMPARISON:  03/27/2019 FINDINGS: The heart is enlarged but stable.  Left PICC line appears stable. Severe and progressive bilateral airspace process. Persistent small pleural effusions. No pneumothorax. IMPRESSION: Severe and progressive bilateral airspace process. Electronically Signed   By: Rudie MeyerP.  Gallerani M.D.   On: 03/30/2019 10:41   Dg Chest Port 1v Same Day  Result Date: 03/30/2019 CLINICAL DATA:  Status post intubation and OG tube placement today. EXAM: PORTABLE CHEST 1 VIEW COMPARISON:  Single-view of the chest earlier today. FINDINGS: Endotracheal tube tip is 1.5 cm above the carina. The tube could be withdrawn 1-2 cm for better positioning. OG tube courses into the stomach and below the inferior margin the film. Severe and diffuse bilateral airspace disease is again seen. Cardiac silhouette is obscured. No pneumothorax is identified. IMPRESSION: ETT tip is 1.5 cm above the carina. The tube could be withdrawn 1-2 cm. OG tube courses into the stomach and below the inferior margin the film. No change in diffuse bilateral airspace disease. Electronically Signed   By: Drusilla Kannerhomas  Dalessio M.D.   On: 03/30/2019 11:53   Dg Abd 2 Views  Result Date: 03/30/2019 CLINICAL DATA:  Follow up small bowel obstruction EXAM: ABDOMEN - 2 VIEW COMPARISON:  03/26/2019 FINDINGS: Stable scoliosis is noted. Bibasilar infiltrative changes are seen. A few persistent loops of dilated small bowel are seen. No free air is noted. The overall  appearance has improved slightly in the interval from the prior exam. IMPRESSION: Slight improvement in mild small bowel dilatation. No new focal abnormality is seen. Electronically Signed   By: Inez Catalina M.D.   On: 03/30/2019 08:08    Anti-infectives: Anti-infectives (From admission, onward)   Start     Dose/Rate Route Frequency Ordered Stop    03/27/19 1045  azithromycin (ZITHROMAX) 500 mg in sodium chloride 0.9 % 250 mL IVPB  Status:  Discontinued     500 mg 250 mL/hr over 60 Minutes Intravenous Every 24 hours 03/27/19 1038 03/30/19 1236   03/26/19 0900  meropenem (MERREM) 1,000 mg in sodium chloride 0.9 % 100 mL IVPB     1,000 mg 200 mL/hr over 30 Minutes Intravenous Every 12 hours 03/26/19 0814     2019/04/09 2200  cefTRIAXone (ROCEPHIN) 2 g in sodium chloride 0.9 % 100 mL IVPB  Status:  Discontinued     2 g 200 mL/hr over 30 Minutes Intravenous Every 24 hours 04/09/19 2044 03/26/19 0814   2019-04-09 2100  metroNIDAZOLE (FLAGYL) IVPB 500 mg  Status:  Discontinued     500 mg 100 mL/hr over 60 Minutes Intravenous Every 8 hours 04/09/19 2044 03/26/19 6546      Assessment/Plan: Ms. Soyars is a 85 yo with resolving SBO and respiratory failure from PNA.  Intubated now.  -Ok to start TF slowly. Would just do low rate and check residuals for first 24 hours.  -Discussed with Dr. Dyann Kief.    LOS: 6 days    Virl Cagey 03/31/2019

## 2019-03-31 NOTE — Progress Notes (Signed)
PT tolerating tube with with no complications. Feed increased by 91mL as directed by nutritionist. Now infusing at 53mL per hr.

## 2019-03-31 NOTE — Progress Notes (Addendum)
Nutrition Brief Note  RD consulted to begin trickle feeds. Pt fully assessed yesterday afternoon. Will initiate recommendations from that note, but at a low rate.   Initiate at Vital High protein at 20cc/hr and increase by 10cc q8 hrs. Will reach goal of 50 cc within 24 hrs. Per MD note, surgery would like to check gastric residuals for the initial 24 hrs after TF start. Placed nursing order for gastric residual check q4 hrs.   Will follow for TF tolerance.   Burtis Junes RD, LDN, CNSC Clinical Nutrition Available Tues-Sat via Pager: [phone] 03/31/2019 12:25 PM

## 2019-03-31 NOTE — Progress Notes (Signed)
PROGRESS NOTE  Carrie Morales KKX:[id number] DOB: April 09, 1928 DOA: 03/20/2019 PCP: Earney Mallet, MD  Brief History:  85 year old female with a history of chronic atrial fibrillation on apixaban, diastolic CHF, hypertension, bowel obstruction status post bowel resection 30 years ago presenting with 1 week history of abdominal pain that worsened over the past 2 to 3 days to the point of resulting in nausea and vomiting.  The patient was also having constipation-like symptoms and tried some over-the-counter laxatives without relief.  Because of her progressive symptoms she presented for further evaluation.  CT of the abdomen and pelvis in the ED showed left basilar collapse less consolidative changes, small bilateral pleural effusions, left greater than right with a large hiatus hernia.  There was also dilatation of the proximal and mid small bowel loops up to 2.9 cm consistent with a high-grade SBO.  The patient was treated with bowel rest and IV fluids.  When she arrived to the medical floor, the patient developed atrial fibrillation with RVR and respiratory distress.  She was transferred to the stepdown unit and started on diltiazem drip and placed on BiPAP.  Subsequently, the patient became hypotensive requiring fluid resuscitation.  Repeat chest x-ray show worsening bilateral perihilar densities with stable bibasilar opacifications.  Lactic acid peaked at 4.4.  The patient was started on ceftriaxone and metronidazole.  Assessment/Plan: Sepsis -Present at the time of admission -Secondary to aspiration pneumonia/CAP -Lactic acid peaked at 4.4; now normal. -procalcitonin  Peaked at 5.86 -WBCs started to trend down; which is something that code changed now that the patient will be started on a steroids. -Follow blood culture--neg so far -Urinalysis negative -Continue current IV antibiotics and supportive care.  Acute respiratory failure with hypoxia hypercapnia -Secondary to  pneumonia, ARDS and acute on chronic heart failure. -Patient failed response of BiPAP and in the requiring intubation and  mechanical ventilation -Follow ARDS protocol -Appreciate pulmonology's assistance and recommendations -Continue current IV antibiotics -Solu-Medrol 40 mg every 12 hours as recommended by pulmonologist. -Diuresis as tolerated -Follow clinical response.  Small bowel obstruction -General surgery consult appreciated -6/10--case discussed with Dr. Constance Haw -Patient having bowel movements -NG tube has been discontinued following general surgery recommendations. -Continue electrolytes repletion. -Continue Protonix -Following stability will ended requiring tube feedings while intubated. -Anticipating clamping trial later today. -start Feeding tubes. -Repeat 2 views abdominal x-ray demonstrated improvement in the small bowel dilatation and not new focal abnormality.  Atrial fibrillation with RVR -Precipitated by her acute medical condition -Will continue metoprolol and to further control patient's rate will use amiodarone. -Continue heparin drip -Overall with improved rate control. -Echocardiogram--EF >65%, no WMA, mod MR -Appreciate cardiology consult  Acute on chronic diastolic CHF -Continue daily weights -Follow strict I's and O's -Continue IV Lasix 40 mg every 12 hours -Follow volume status.  Acute on chronic renal failure: Stage III at baseline.   -Secondary to hemodynamic changes and hypotension. -Baseline creatinine~0.8-1.1 -monitor with diuresis -Creatinine peak of 1.3; and then down to 1.1 -Will continue to monitor trend especially while receiving IV Lasix now.  Hyperglycemia -check A1C--5.3 -Elevated blood sugars in the setting of stress demargination.   Disposition Plan:   Remain in ICU Family Communication:   Son updated at bedside 6/13  Consultants:  General surgery; Cardiology; pulmonology  Code Status:  FULL  DVT Prophylaxis:    Continue IV Heparin   Procedures: As Listed in Progress Note Above  Antibiotics: Ceftriaxone/flagyl 6/7>>6/8 Merrem 6/8>>>  Patient remains  critically ill with multiple organ system failure requiring high complexity decision making for assessment and support, frequent evaluation and titration of therapies, application of advance monitoring technologies and extensive interpretation of multiple databases.  Patient's son was updated at bedside and all questions were answered to his satisfaction.  Chest x-ray to slightly better from a vascular congestion standpoint.  Patient is afebrile; intubated and mechanically ventilated. Case discussed with pulmonology service.    Critical care time: 35 minutes.   Subjective: Intubated and mechanically ventilated.  Afebrile.  Patient using 100% oxygen supplementation with 10 of PEEP, rate of 28 and a volume of 330.  Heart rate elevated and irregular; good urine output reported with current use of Lasix.    Objective: Vitals:   03/31/19 0438 03/31/19 0500 03/31/19 0838 03/31/19 0853  BP:  104/65    Pulse:  73    Resp:  (!) 26    Temp: 97.7 F (36.5 C)  (!) 97.5 F (36.4 C)   TempSrc: Axillary  Axillary   SpO2:  100%  96%  Weight:      Height:        Intake/Output Summary (Last 24 hours) at 03/31/2019 1057 Last data filed at 03/31/2019 0600 Gross per 24 hour  Intake 2387.22 ml  Output --  Net 2387.22 ml   Weight change:   Exam: General exam: Alert, awake, unable to follow commands. Intubated and mechanically ventilated. Respiratory system: Positive Rales bilaterally; diffuse rhonchi.  Patient is left tachypneic and overall breathing more comfortable. Cardiovascular system: Irregular, positive systolic ejection murmur, no rubs, no gallops. Gastrointestinal system: Abdomen is nondistended, soft and nontender. No organomegaly or masses felt. Normal bowel sounds heard. Central nervous system: Alert and oriented. No focal neurological  deficits. Extremities: No cyanosis or clubbing; trace edema upper extremities bilaterally; no swelling or edema in her lower extremities. Skin: No rashes, lesions or ulcers Psychiatry: Mood & affect appropriate.     Data Reviewed: I have personally reviewed following labs and imaging studies   Basic Metabolic Panel: Recent Labs  Lab 03/26/19 0418 03/27/19 0612 03/28/19 0425 03/29/19 0453 03/31/19 0217  NA 138 140 139 141 146*  K 4.0 4.2 4.3 4.6 6.1*  CL 103 106 107 108 112*  CO2 '25 26 24 29 27  '$ GLUCOSE 200* 89 132* 141* 318*  BUN 29* 38* 44* 39* 35*  CREATININE 1.23* 1.32* 1.14* 0.79 0.81  CALCIUM 9.0 9.2 9.4 9.8 9.6  MG  --  2.0  --  2.3  --   PHOS  --  3.1  --   --   --    Liver Function Tests: Recent Labs  Lab 03/23/2019 1117 03/27/19 0612 03/28/19 0425  AST '25 18 16  '$ ALT '25 17 16  '$ ALKPHOS 67 40 53  BILITOT 1.5* 1.0 1.2  PROT 7.5 5.3* 5.2*  ALBUMIN 4.0 2.5* 2.4*   Recent Labs  Lab 03/21/2019 1117  LIPASE 46   CBC: Recent Labs  Lab 03/19/2019 1117  03/27/19 0612 03/28/19 0425 03/29/19 0453 03/30/19 0444 03/31/19 0217  WBC 12.6*   < > 15.5* 18.1* 18.0* 17.8* 14.9*  NEUTROABS 11.2*  --   --   --   --   --   --   HGB 16.6*   < > 11.4* 11.3* 11.8* 12.0 11.0*  HCT 53.1*   < > 35.9* 36.9 38.8 40.0 36.8  MCV 96.2   < > 95.2 97.6 98.5 99.5 100.5*  PLT 304   < > 191  191 223 224 168   < > = values in this interval not displayed.   Cardiac Enzymes: Recent Labs  Lab 04/01/2019 1117  TROPONINI 0.03*   CBG: Recent Labs  Lab 03/27/19 0747 03/30/19 2030 03/31/19 0004 03/31/19 0419 03/31/19 0803  GLUCAP 75 143* 139* 103* 93   Urine analysis:    Component Value Date/Time   COLORURINE AMBER (A) 04/02/2019 1158   APPEARANCEUR HAZY (A) 04/14/2019 1158   LABSPEC 1.027 04/03/2019 1158   PHURINE 5.0 04/09/2019 1158   GLUCOSEU NEGATIVE 04/14/2019 1158   HGBUR NEGATIVE 03/19/2019 1158   BILIRUBINUR NEGATIVE 03/30/2019 1158   KETONESUR 20 (A) 04/13/2019 1158     PROTEINUR 100 (A) 04/04/2019 1158   UROBILINOGEN 0.2 07/24/2014 1534   NITRITE NEGATIVE 04/01/2019 1158   LEUKOCYTESUR NEGATIVE 03/30/2019 1158    Recent Results (from the past 240 hour(s))  SARS Coronavirus 2 (CEPHEID - Performed in [facility] hospital lab), Hosp Order     Status: None   Collection Time: 04/14/2019  2:26 PM   Specimen: Nasopharyngeal Swab  Result Value Ref Range Status   SARS Coronavirus 2 NEGATIVE NEGATIVE Final    Comment: (NOTE) If result is NEGATIVE SARS-CoV-2 target nucleic acids are NOT DETECTED. The SARS-CoV-2 RNA is generally detectable in upper and lower  respiratory specimens during the acute phase of infection. The lowest  concentration of SARS-CoV-2 viral copies this assay can detect is 250  copies / mL. A negative result does not preclude SARS-CoV-2 infection  and should not be used as the sole basis for treatment or other  patient management decisions.  A negative result may occur with  improper specimen collection / handling, submission of specimen other  than nasopharyngeal swab, presence of viral mutation(s) within the  areas targeted by this assay, and inadequate number of viral copies  (<250 copies / mL). A negative result must be combined with clinical  observations, patient history, and epidemiological information. If result is POSITIVE SARS-CoV-2 target nucleic acids are DETECTED. The SARS-CoV-2 RNA is generally detectable in upper and lower  respiratory specimens dur ing the acute phase of infection.  Positive  results are indicative of active infection with SARS-CoV-2.  Clinical  correlation with patient history and other diagnostic information is  necessary to determine patient infection status.  Positive results do  not rule out bacterial infection or co-infection with other viruses. If result is PRESUMPTIVE POSTIVE SARS-CoV-2 nucleic acids MAY BE PRESENT.   A presumptive positive result was obtained on the submitted specimen  and  confirmed on repeat testing.  While 2019 novel coronavirus  (SARS-CoV-2) nucleic acids may be present in the submitted sample  additional confirmatory testing may be necessary for epidemiological  and / or clinical management purposes  to differentiate between  SARS-CoV-2 and other Sarbecovirus currently known to infect humans.  If clinically indicated additional testing with an alternate test  methodology [phone]) is advised. The SARS-CoV-2 RNA is generally  detectable in upper and lower respiratory sp ecimens during the acute  phase of infection. The expected result is Negative. Fact Sheet for Patients:  StrictlyIdeas.no Fact Sheet for Healthcare Providers: BankingDealers.co.za This test is not yet approved or cleared by the [place] FDA and has been authorized for detection and/or diagnosis of SARS-CoV-2 by FDA under an Emergency Use Authorization (EUA).  This EUA will remain in effect (meaning this test can be used) for the duration of the COVID-19 declaration under Section 564(b)(1) of the Act, 21 U.S.C. section 360bbb-3(b)(1),  unless the authorization is terminated or revoked sooner. Performed at [facility], [address] [place], [place] [address]   MRSA PCR Screening     Status: None   Collection Time: 04/05/2019  7:54 PM   Specimen: Nasal Mucosa; Nasopharyngeal  Result Value Ref Range Status   MRSA by PCR NEGATIVE NEGATIVE Final    Comment:        The GeneXpert MRSA Assay (FDA approved for NASAL specimens only), is one component of a comprehensive MRSA colonization surveillance program. It is not intended to diagnose MRSA infection nor to guide or monitor treatment for MRSA infections. Performed at [facility], [address], [place], [place] [address]   Culture, blood (Routine X 2) w Reflex to ID Panel     Status: None   Collection Time: 04/05/2019  9:50 PM   Specimen: Left Antecubital; Blood  Result Value Ref  Range Status   Specimen Description LEFT ANTECUBITAL  Final   Special Requests   Final    BOTTLES DRAWN AEROBIC AND ANAEROBIC Blood Culture adequate volume   Culture   Final    NO GROWTH 5 DAYS Performed at [facility], [address] [place], [place] [address]    Report Status 03/30/2019 FINAL  Final     Scheduled Meds:  chlorhexidine gluconate (MEDLINE KIT)  15 mL Mouth Rinse BID   Chlorhexidine Gluconate Cloth  6 each Topical Daily   furosemide  40 mg Intravenous BID   mouth rinse  15 mL Mouth Rinse 10 times per day   methylPREDNISolone (SOLU-MEDROL) injection  40 mg Intravenous Q12H   metoprolol tartrate  5 mg Intravenous Q8H   pantoprazole (PROTONIX) IV  40 mg Intravenous Q24H   sodium chloride flush  10-40 mL Intracatheter Q12H   sodium chloride flush  3 mL Intravenous Q12H   Continuous Infusions:  sodium chloride 10 mL/hr at 03/31/19 0600   amiodarone 60 mg/hr (03/31/19 0936)   Followed by   amiodarone     dextrose 5% lactated ringers with KCl 20 mEq/L Stopped (03/31/19 0000)   fentaNYL infusion INTRAVENOUS 25 mcg/hr (03/31/19 0600)   heparin 1,000 Units/hr (03/31/19 0600)   meropenem (MERREM) IV 1,000 mg (03/31/19 0838)    Procedures/Studies: Ct Chest Wo Contrast  Result Date: 03/27/2019 CLINICAL DATA:  Acute respiratory illness.  Hypoxia. EXAM: CT CHEST WITHOUT CONTRAST TECHNIQUE: Multidetector CT imaging of the chest was performed following the standard protocol without IV contrast. COMPARISON:  None FINDINGS: Cardiovascular: Moderate cardiac enlargement and small pericardial effusion. Aortic atherosclerosis. Calcification in the left main, left circumflex coronary arteries identified. Mediastinum/Nodes: Normal appearance of the thyroid gland. The trachea appears patent and is midline. Large hiatal hernia. Nasogastric tube is identified. The tube forms a loop within the hiatal hernia and then continues into the intra-abdominal stomach. Right paratracheal  lymph node measures 1.5 cm, image 53/4. Left paratracheal lymph node measures 1.6 cm, image 43/4. Subcarinal lymph node measures 2.1 cm. Hilar structures are suboptimally evaluated due to lack of IV contrast material. No axillary or supraclavicular adenopathy. Lungs/Pleura: Small bilateral pleural effusions identified. The left pleural effusion appears mildly loculated. There is subsegmental scratch set partial consolidation in atelectasis of the right lower lobe noted. There is complete airspace consolidation of the entire left lower lobe. Partial consolidation in atelectasis of the lingula. Patchy ground-glass and airspace densities noted within the right middle lobe and central left upper lobe. Upper Abdomen: No acute abnormality. Musculoskeletal: Bones appear osteopenic. Kyphoscoliosis deformity of the thoracic spine  noted. IMPRESSION: 1. Small to moderate bilateral pleural effusions. Multifocal, bilateral airspace consolidation identified compatible with pneumonia. The left lower lobe is completely consolidated/collapsed. There is partial consolidation/collapse of the right lower lobe and lingula. Patchy ground-glass and airspace densities are noted within the right middle lobe and both upper lobes. 2. Enlarged mediastinal lymph nodes. In the setting of CHF and pneumonia these are nonspecific and may be reactive. Follow-up imaging with CT of the chest in 3 months is recommended. This recommendation follows ACR consensus guidelines: Managing Incidental Findings on Thoracic CT: Mediastinal and Cardiovascular Findings. A White Paper of the ACR Incidental Findings Committee. J Am Coll Radiol. 2018; 15: 1478-2956. 3. Cardiac enlargement and small pericardial effusion 4. Aortic Atherosclerosis (ICD10-I70.0). Coronary artery calcifications. Electronically Signed   By: Kerby Moors M.D.   On: 03/27/2019 15:36   Ct Abdomen Pelvis W Contrast  Result Date: 03/23/2019 CLINICAL DATA:  Constipation since Thursday.  Vomiting. Hysterectomy. Appendectomy. High-grade bowel obstruction. EXAM: CT ABDOMEN AND PELVIS WITH CONTRAST TECHNIQUE: Multidetector CT imaging of the abdomen and pelvis was performed using the standard protocol following bolus administration of intravenous contrast. CONTRAST:  134m OMNIPAQUE IOHEXOL 300 MG/ML  SOLN COMPARISON:  None. FINDINGS: Lower chest: Right base atelectasis. Left base collapse/consolidative change. Moderate cardiomegaly with small pericardial effusion. Small bilateral pleural effusions. A large hiatal hernia, with approximately 1/2 of the stomach positioned in the lower chest. Hepatobiliary: Anterior left hepatic lobe cyst. Cholecystectomy, without biliary ductal dilatation. Pancreas: Normal, without mass or ductal dilatation. Spleen: Normal in size, without focal abnormality. Adrenals/Urinary Tract: Normal adrenal glands. Bilateral renal low-density lesions which are likely cysts and minimally complex cysts. Other renal lesions are too small to characterize. No hydronephrosis. Normal urinary bladder. Stomach/Bowel: The stomach is distended and fluid-filled. The left side of the colon is relatively decompressed with scattered diverticula. Proximal and mid small bowel loops are dilated including at up to 2.9 cm. This continues to the level of a transition point within the central pelvis, likely in the proximal ileum on image 55/3 and coronal image 41. No obstructive mass identified. No complicating ischemia. Vascular/Lymphatic: Aortic and branch vessel atherosclerosis. No abdominopelvic adenopathy. Reproductive: Hysterectomy.  No adnexal mass. Other: Mild pelvic floor laxity.  No free intraperitoneal air. Musculoskeletal: Lumbosacral spondylosis with moderate convex left lumbar spine curvature. IMPRESSION: 1. High-grade partial small-bowel obstruction, to the level of the mid small bowel. Most likely related to adhesions. No complicating ischemia. 2. Distended stomach with large hiatal  hernia. The patient may benefit from nasogastric tube placement. 3. Small bilateral pleural effusions with left worse than right base airspace disease. On the right, likely atelectasis. On the left, infection or aspiration cannot be excluded. 4. Cardiomegaly with small pericardial effusion. 5.  Aortic Atherosclerosis (ICD10-I70.0). Electronically Signed   By: KAbigail MiyamotoM.D.   On: 03/19/2019 14:02   Dg Chest Port 1 View  Result Date: 03/31/2019 CLINICAL DATA:  Respiratory failure. EXAM: PORTABLE CHEST 1 VIEW COMPARISON:  03/30/2019 FINDINGS: The patient is rotated to the left, and the patient's chin partially obscures the left lung apex. An endotracheal tube remains in place terminating approximately 1.5 cm of above the carina, not significantly changed. An enteric tube courses into the abdomen with tip not imaged. A left PICC terminates over the right atrium. There is persistent extensive airspace opacity throughout both lungs, stable to minimally improved. IMPRESSION: Stable to minimally improved extensive bilateral airspace disease. Electronically Signed   By: ALogan BoresM.D.   On: 03/31/2019 09:39  Dg Chest Port 1 View  Result Date: 03/30/2019 CLINICAL DATA:  Shortness of breath. EXAM: PORTABLE CHEST 1 VIEW COMPARISON:  03/27/2019 FINDINGS: The heart is enlarged but stable.  Left PICC line appears stable. Severe and progressive bilateral airspace process. Persistent small pleural effusions. No pneumothorax. IMPRESSION: Severe and progressive bilateral airspace process. Electronically Signed   By: Marijo Sanes M.D.   On: 03/30/2019 10:41   Dg Chest Port 1 View  Result Date: 03/27/2019 CLINICAL DATA:  Acute respiratory failure with hypoxia. EXAM: PORTABLE CHEST 1 VIEW COMPARISON:  One-view chest x-ray 03/26/2019 FINDINGS: Heart is enlarged. Left hemidiaphragm is elevated. NG tube is in the stomach. Left-sided PICC line terminates in the right atrium. Pulmonary vascular congestion is increased.  Bilateral infiltrates have increased. IMPRESSION: 1. Increasing bilateral infiltrates, left greater than right. This is concerning for infection. 2. Elevation of left hemidiaphragm. 3. Stable cardiomegaly. 4. Left-sided PICC line terminates in the right atrium. Electronically Signed   By: San Morelle M.D.   On: 03/27/2019 08:37   Dg Chest Port 1 View  Result Date: 03/26/2019 CLINICAL DATA:  Follow-up PICC line placement EXAM: PORTABLE CHEST 1 VIEW COMPARISON:  03/20/2019 FINDINGS: Left-sided PICC line is noted with catheter tip at the cavoatrial junction. Gastric catheter is noted coiled within the stomach. Elevation of the left hemidiaphragm is noted with some associated infiltrates bilaterally left greater than right similar to that seen on the prior exam. No bony abnormality is noted. IMPRESSION: Stable infiltrates bilaterally left greater than right. New left-sided PICC line in satisfactory position. Electronically Signed   By: Inez Catalina M.D.   On: 03/26/2019 12:46   Dg Chest Port 1 View  Result Date: 03/24/2019 CLINICAL DATA:  NG tube placement. EXAM: PORTABLE CHEST 1 VIEW COMPARISON:  04/15/2019 and CT 03/31/2019 FINDINGS: Patient is slightly rotated to the left. Nasogastric tube is coiled once over the stomach in patient's known large hiatal hernia and then proceeds inferiorly over the left upper quadrant and off the film as tip is not visualized. Non radiopaque side-port is over the stomach in the left upper abdomen. Exam demonstrates adequate lung volumes with worsening bilateral perihilar opacification suggesting interstitial edema and less likely infection. Persistent opacification over the left base likely effusion with atelectasis. Stable mild hazy density over the right base which may represent a small effusion with atelectasis. Infection over the lung bases is possible. Stable cardiomegaly. Remainder of the exam is unchanged. IMPRESSION: Worsening bilateral perihilar opacification  suggesting worsening interstitial edema. Stable bibasilar opacification likely small effusions left greater than right with associated basilar atelectasis. Infection in the mid to lower lungs is possible. Stable cardiomegaly. Nasogastric tube coiled once over the large hiatal hernia with side-port over the stomach in the left upper quadrant and tip not visualized. Electronically Signed   By: Marin Olp M.D.   On: 04/16/2019 20:28   Dg Chest Portable 1 View  Result Date: 03/30/2019 CLINICAL DATA:  Nasogastric tube placement EXAM: PORTABLE CHEST 1 VIEW COMPARISON:  Abdominal CT from earlier today FINDINGS: Nasogastric tube which at least reaches the stomach (which is low lying and distended by CT). There is haziness at both lung bases where there is atelectasis and pleural fluid by prior CT. Cardiopericardial enlargement. IMPRESSION: 1. The nasogastric tube reaches the stomach at least. 2. Cardiomegaly with bilateral pleural effusion obscuring the lower lobes. Electronically Signed   By: Monte Fantasia M.D.   On: 03/31/2019 16:36   Dg Chest Port 1v Same Day  Result  Date: 03/30/2019 CLINICAL DATA:  Status post intubation and OG tube placement today. EXAM: PORTABLE CHEST 1 VIEW COMPARISON:  Single-view of the chest earlier today. FINDINGS: Endotracheal tube tip is 1.5 cm above the carina. The tube could be withdrawn 1-2 cm for better positioning. OG tube courses into the stomach and below the inferior margin the film. Severe and diffuse bilateral airspace disease is again seen. Cardiac silhouette is obscured. No pneumothorax is identified. IMPRESSION: ETT tip is 1.5 cm above the carina. The tube could be withdrawn 1-2 cm. OG tube courses into the stomach and below the inferior margin the film. No change in diffuse bilateral airspace disease. Electronically Signed   By: Inge Rise M.D.   On: 03/30/2019 11:53   Dg Abd 2 Views  Result Date: 03/30/2019 CLINICAL DATA:  Follow up small bowel obstruction  EXAM: ABDOMEN - 2 VIEW COMPARISON:  03/26/2019 FINDINGS: Stable scoliosis is noted. Bibasilar infiltrative changes are seen. A few persistent loops of dilated small bowel are seen. No free air is noted. The overall appearance has improved slightly in the interval from the prior exam. IMPRESSION: Slight improvement in mild small bowel dilatation. No new focal abnormality is seen. Electronically Signed   By: Inez Catalina M.D.   On: 03/30/2019 08:08   Dg Abd Portable 2v  Result Date: 03/26/2019 CLINICAL DATA:  Small bowel obstruction, colon resection. EXAM: PORTABLE ABDOMEN - 2 VIEW COMPARISON:  CT abdomen pelvis 03/27/2019. FINDINGS: Nasogastric tube terminates in the stomach. Persistent small bowel dilatation. No minimal colonic gas. Retained contrast in the bladder with Foley catheter in place. Postoperative changes in the left lower quadrant. Incidental imaging of the lower chest shows left lower lobe consolidation with patchy airspace opacification bilaterally. Bilateral pleural effusions are noted as well. IMPRESSION: 1. Persistent small bowel obstruction. 2. Left lower lobe consolidation with patchy bilateral airspace opacification and bilateral pleural effusions, better evaluated on chest radiograph done the same day. Electronically Signed   By: Lorin Picket M.D.   On: 03/26/2019 12:46    Barton Dubois, MD  [facility] Pager [phone]   03/31/2019, 10:57 AM   LOS: 6 days

## 2019-03-31 NOTE — Progress Notes (Signed)
Changed to finger probe hopefully more reliable than ear probe.Sat 100 on ear probe -- 97 on finger at 100 percent oxygen.  Oxygen has been decreased to 90 percent

## 2019-03-31 NOTE — Progress Notes (Addendum)
Subjective: She remains intubated and on the ventilator.  No new problems noted.  She is still on 100% 10 of PEEP rate of 28.  Her heart rate has been somewhat elevated up into the 140s.  She is responsive.  Blood pressure has remained somewhat soft.  Objective: Vital signs in last 24 hours: Temp:  [97.5 F (36.4 C)-99.1 F (37.3 C)] 97.7 F (36.5 C) (06/13 0438) Pulse Rate:  [44-152] 73 (06/13 0500) Resp:  [18-54] 26 (06/13 0500) BP: (62-144)/(38-92) 104/65 (06/13 0500) SpO2:  [80 %-100 %] 100 % (06/13 0500) FiO2 (%):  [90 %-100 %] 100 % (06/13 0352) Weight change:  Last BM Date: 03/29/19  Intake/Output from previous day: 06/12 0701 - 06/13 0700 In: 3008 [I.V.:2139.4; IV Piggyback:443.6] Out: -   PHYSICAL EXAM General appearance: alert and Intubated on the ventilator Resp: She has bilateral rales and rhonchi Cardio: Her heart is irregular.  She has a heart murmur.  I do not hear a gallop.  She is tachycardic GI: soft, non-tender; bowel sounds normal; no masses,  no organomegaly Extremities: extremities normal, atraumatic, no cyanosis or edema  Lab Results:  Results for orders placed or performed during the hospital encounter of 04/09/2019 (from the past 48 hour(s))  CBC     Status: Abnormal   Collection Time: 03/30/19  4:44 AM  Result Value Ref Range   WBC 17.8 (H) 4.0 - 10.5 K/uL   RBC 4.02 3.87 - 5.11 MIL/uL   Hemoglobin 12.0 12.0 - 15.0 g/dL   HCT 40.0 36.0 - 46.0 %   MCV 99.5 80.0 - 100.0 fL   MCH 29.9 26.0 - 34.0 pg   MCHC 30.0 30.0 - 36.0 g/dL   RDW 14.2 11.5 - 15.5 %   Platelets 224 150 - 400 K/uL   nRBC 0.0 0.0 - 0.2 %    Comment: Performed at [facility], [address] [place], [place] [address]  Heparin level (unfractionated)     Status: Abnormal   Collection Time: 03/30/19  4:44 AM  Result Value Ref Range   Heparin Unfractionated <0.10 (L) 0.30 - 0.70 IU/mL    Comment: (NOTE) If heparin results are below expected values, and patient dosage has  been  confirmed, suggest follow up testing of antithrombin III levels. Performed at [facility], [address] [place], [place] [address]   Lactic acid, plasma     Status: None   Collection Time: 03/30/19  4:44 AM  Result Value Ref Range   Lactic Acid, Venous 1.5 0.5 - 1.9 mmol/L    Comment: Performed at [facility], [address], [place], [place] [address]  Blood gas, arterial     Status: Abnormal   Collection Time: 03/30/19  9:13 AM  Result Value Ref Range   FIO2 100.00    pH, Arterial 7.363 7.350 - 7.450   pCO2 arterial 53.2 (H) 32.0 - 48.0 mmHg   pO2, Arterial 43.1 (L) 83.0 - 108.0 mmHg   Bicarbonate 27.0 20.0 - 28.0 mmol/L   Acid-Base Excess 4.4 (H) 0.0 - 2.0 mmol/L   O2 Saturation 75.4 %   Patient temperature 36.8    Allens test (pass/fail) RADIAL (A) PASS    Comment: Performed at [facility], [address], [place], [place] [address]  Blood gas, arterial     Status: Abnormal   Collection Time: 03/30/19  1:25 PM  Result Value Ref Range   FIO2 100.00    pH, Arterial 7.316 (L) 7.350 - 7.450   pCO2  arterial 58.3 (H) 32.0 - 48.0 mmHg   pO2, Arterial 63.2 (L) 83.0 - 108.0 mmHg   Bicarbonate 26.0 20.0 - 28.0 mmol/L   Acid-Base Excess 3.2 (H) 0.0 - 2.0 mmol/L   O2 Saturation 89.4 %   Patient temperature 37.1    Allens test (pass/fail) RADIAL (A) PASS    Comment: Performed at [facility], [address] [place], [place] [address]  Heparin level (unfractionated)     Status: Abnormal   Collection Time: 03/30/19  2:57 PM  Result Value Ref Range   Heparin Unfractionated <0.10 (L) 0.30 - 0.70 IU/mL    Comment: (NOTE) If heparin results are below expected values, and patient dosage has  been confirmed, suggest follow up testing of antithrombin III levels. Performed at [facility], [address] [place], [place] [address]   Blood gas, arterial     Status: Abnormal   Collection Time: 03/30/19  6:50 PM  Result Value Ref Range   FIO2 100.00    Delivery systems VENTILATOR    pH,  Arterial 7.370 7.350 - 7.450   pCO2 arterial 52.3 (H) 32.0 - 48.0 mmHg   pO2, Arterial 69.4 (L) 83.0 - 108.0 mmHg   Bicarbonate 27.6 20.0 - 28.0 mmol/L   Acid-Base Excess 4.5 (H) 0.0 - 2.0 mmol/L   O2 Saturation 93.0 %   Patient temperature 37.0    Allens test (pass/fail) PASS PASS    Comment: Performed at [facility], [address] [place], [place] [address]  Glucose, capillary     Status: Abnormal   Collection Time: 03/30/19  8:30 PM  Result Value Ref Range   Glucose-Capillary 143 (H) 70 - 99 mg/dL   Comment 1 Notify RN    Comment 2 Document in Chart   Glucose, capillary     Status: Abnormal   Collection Time: 03/31/19 12:04 AM  Result Value Ref Range   Glucose-Capillary 139 (H) 70 - 99 mg/dL   Comment 1 Notify RN    Comment 2 Document in Chart   CBC     Status: Abnormal   Collection Time: 03/31/19  2:17 AM  Result Value Ref Range   WBC 14.9 (H) 4.0 - 10.5 K/uL   RBC 3.66 (L) 3.87 - 5.11 MIL/uL   Hemoglobin 11.0 (L) 12.0 - 15.0 g/dL   HCT 36.8 36.0 - 46.0 %   MCV 100.5 (H) 80.0 - 100.0 fL   MCH 30.1 26.0 - 34.0 pg   MCHC 29.9 (L) 30.0 - 36.0 g/dL   RDW 14.5 11.5 - 15.5 %   Platelets 168 150 - 400 K/uL   nRBC 0.0 0.0 - 0.2 %    Comment: Performed at [facility], [address] [place], [place] [address]  Basic metabolic panel     Status: Abnormal   Collection Time: 03/31/19  2:17 AM  Result Value Ref Range   Sodium 146 (H) 135 - 145 mmol/L   Potassium 6.1 (H) 3.5 - 5.1 mmol/L    Comment: DELTA CHECK NOTED   Chloride 112 (H) 98 - 111 mmol/L   CO2 27 22 - 32 mmol/L   Glucose, Bld 318 (H) 70 - 99 mg/dL   BUN 35 (H) 8 - 23 mg/dL   Creatinine, Ser 0.81 0.44 - 1.00 mg/dL   Calcium 9.6 8.9 - 10.3 mg/dL   GFR calc non Af Amer >60 >60 mL/min   GFR calc Af Amer >60 >60 mL/min   Anion gap 7 5 - 15  Comment: Performed at [facility], [address] [place], [place] [address]  Heparin level (unfractionated)     Status: Abnormal   Collection Time: 03/31/19  2:17 AM   Result Value Ref Range   Heparin Unfractionated 0.22 (L) 0.30 - 0.70 IU/mL    Comment: (NOTE) If heparin results are below expected values, and patient dosage has  been confirmed, suggest follow up testing of antithrombin III levels. Performed at [facility], [address], [place], [place] [address]   Blood gas, arterial     Status: Abnormal   Collection Time: 03/31/19  3:36 AM  Result Value Ref Range   FIO2 90.00    pH, Arterial 7.333 (L) 7.350 - 7.450   pCO2 arterial 59.7 (H) 32.0 - 48.0 mmHg   pO2, Arterial 58.9 (L) 83.0 - 108.0 mmHg   Bicarbonate 27.7 20.0 - 28.0 mmol/L   Acid-Base Excess 5.2 (H) 0.0 - 2.0 mmol/L   O2 Saturation 86.4 %   Patient temperature 37.0    Allens test (pass/fail) PASS PASS    Comment: Performed at [facility], [address], [place], [place] [address]  Glucose, capillary     Status: Abnormal   Collection Time: 03/31/19  4:19 AM  Result Value Ref Range   Glucose-Capillary 103 (H) 70 - 99 mg/dL  Glucose, capillary     Status: None   Collection Time: 03/31/19  8:03 AM  Result Value Ref Range   Glucose-Capillary 93 70 - 99 mg/dL    ABGS Recent Labs    03/31/19 0336  PHART 7.333*  PO2ART 58.9*  HCO3 27.7   CULTURES Recent Results (from the past 240 hour(s))  SARS Coronavirus 2 (CEPHEID - Performed in [place] hospital lab), Hosp Order     Status: None   Collection Time: 04/01/2019  2:26 PM   Specimen: Nasopharyngeal Swab  Result Value Ref Range Status   SARS Coronavirus 2 NEGATIVE NEGATIVE Final    Comment: (NOTE) If result is NEGATIVE SARS-CoV-2 target nucleic acids are NOT DETECTED. The SARS-CoV-2 RNA is generally detectable in upper and lower  respiratory specimens during the acute phase of infection. The lowest  concentration of SARS-CoV-2 viral copies this assay can detect is 250  copies / mL. A negative result does not preclude SARS-CoV-2 infection  and should not be used as the sole basis for treatment or other  patient  management decisions.  A negative result may occur with  improper specimen collection / handling, submission of specimen other  than nasopharyngeal swab, presence of viral mutation(s) within the  areas targeted by this assay, and inadequate number of viral copies  (<250 copies / mL). A negative result must be combined with clinical  observations, patient history, and epidemiological information. If result is POSITIVE SARS-CoV-2 target nucleic acids are DETECTED. The SARS-CoV-2 RNA is generally detectable in upper and lower  respiratory specimens dur ing the acute phase of infection.  Positive  results are indicative of active infection with SARS-CoV-2.  Clinical  correlation with patient history and other diagnostic information is  necessary to determine patient infection status.  Positive results do  not rule out bacterial infection or co-infection with other viruses. If result is PRESUMPTIVE POSTIVE SARS-CoV-2 nucleic acids MAY BE PRESENT.   A presumptive positive result was obtained on the submitted specimen  and confirmed on repeat testing.  While 2019 novel coronavirus  (SARS-CoV-2) nucleic acids may be present in the submitted sample  additional confirmatory testing may be necessary for epidemiological  and / or clinical management purposes  to differentiate between  SARS-CoV-2 and other Sarbecovirus currently known to infect humans.  If clinically indicated additional testing with an alternate test  methodology [phone]) is advised. The SARS-CoV-2 RNA is generally  detectable in upper and lower respiratory sp ecimens during the acute  phase of infection. The expected result is Negative. Fact Sheet for Patients:  StrictlyIdeas.no Fact Sheet for Healthcare Providers: BankingDealers.co.za This test is not yet approved or cleared by the [place] FDA and has been authorized for detection and/or diagnosis of SARS-CoV-2 by FDA under  an Emergency Use Authorization (EUA).  This EUA will remain in effect (meaning this test can be used) for the duration of the COVID-19 declaration under Section 564(b)(1) of the Act, 21 U.S.C. section 360bbb-3(b)(1), unless the authorization is terminated or revoked sooner. Performed at [facility], [address], [place], [place] [address]   MRSA PCR Screening     Status: None   Collection Time: 04/11/2019  7:54 PM   Specimen: Nasal Mucosa; Nasopharyngeal  Result Value Ref Range Status   MRSA by PCR NEGATIVE NEGATIVE Final    Comment:        The GeneXpert MRSA Assay (FDA approved for NASAL specimens only), is one component of a comprehensive MRSA colonization surveillance program. It is not intended to diagnose MRSA infection nor to guide or monitor treatment for MRSA infections. Performed at [facility], [address] [place], [place] [address]   Culture, blood (Routine X 2) w Reflex to ID Panel     Status: None   Collection Time: 04/16/2019  9:50 PM   Specimen: Left Antecubital; Blood  Result Value Ref Range Status   Specimen Description LEFT ANTECUBITAL  Final   Special Requests   Final    BOTTLES DRAWN AEROBIC AND ANAEROBIC Blood Culture adequate volume   Culture   Final    NO GROWTH 5 DAYS Performed at [facility], [address], [place], [place] [address]    Report Status 03/30/2019 FINAL  Final   Studies/Results: Dg Chest Port 1 View  Result Date: 03/30/2019 CLINICAL DATA:  Shortness of breath. EXAM: PORTABLE CHEST 1 VIEW COMPARISON:  03/27/2019 FINDINGS: The heart is enlarged but stable.  Left PICC line appears stable. Severe and progressive bilateral airspace process. Persistent small pleural effusions. No pneumothorax. IMPRESSION: Severe and progressive bilateral airspace process. Electronically Signed   By: Marijo Sanes M.D.   On: 03/30/2019 10:41   Dg Chest Port 1v Same Day  Result Date: 03/30/2019 CLINICAL DATA:  Status post intubation and OG tube placement  today. EXAM: PORTABLE CHEST 1 VIEW COMPARISON:  Single-view of the chest earlier today. FINDINGS: Endotracheal tube tip is 1.5 cm above the carina. The tube could be withdrawn 1-2 cm for better positioning. OG tube courses into the stomach and below the inferior margin the film. Severe and diffuse bilateral airspace disease is again seen. Cardiac silhouette is obscured. No pneumothorax is identified. IMPRESSION: ETT tip is 1.5 cm above the carina. The tube could be withdrawn 1-2 cm. OG tube courses into the stomach and below the inferior margin the film. No change in diffuse bilateral airspace disease. Electronically Signed   By: Inge Rise M.D.   On: 03/30/2019 11:53   Dg Abd 2 Views  Result Date: 03/30/2019 CLINICAL DATA:  Follow up small bowel obstruction EXAM: ABDOMEN - 2 VIEW COMPARISON:  03/26/2019 FINDINGS: Stable scoliosis is noted. Bibasilar infiltrative changes are seen. A few persistent loops of  dilated small bowel are seen. No free air is noted. The overall appearance has improved slightly in the interval from the prior exam. IMPRESSION: Slight improvement in mild small bowel dilatation. No new focal abnormality is seen. Electronically Signed   By: Inez Catalina M.D.   On: 03/30/2019 08:08    Medications:  Prior to Admission:  Medications Prior to Admission  Medication Sig Dispense Refill Last Dose  . apixaban (ELIQUIS) 2.5 MG TABS tablet Take 2.5 mg by mouth 2 (two) times daily.    03/24/2019 at Unknown time  . aspirin 81 MG tablet Take 81 mg by mouth daily.   unknown  . Calcium Carbonate-Vitamin D (CALCIUM + D PO) Take 1 tablet by mouth daily.    unknown  . digoxin (LANOXIN) 0.125 MG tablet Take 0.125 mg by mouth daily.   03/24/2019 at Unknown time  . furosemide (LASIX) 20 MG tablet Take 20 mg by mouth See admin instructions. Take one tablet by mouth daily 5 days per week   03/23/2019 at Unknown time  . losartan (COZAAR) 50 MG tablet Take 50 mg by mouth daily.    03/24/2019 at Unknown time   . metoprolol tartrate (LOPRESSOR) 100 MG tablet Take 100 mg by mouth 2 (two) times daily.    03/24/2019 at Unknown time  . Multiple Vitamin (MULTIVITAMIN) tablet Take 1 tablet by mouth daily.   03/24/2019  . ondansetron (ZOFRAN) 4 MG tablet Take 4 mg by mouth daily as needed for nausea or vomiting.   03/24/2019 at Unknown time  . potassium chloride (K-DUR) 10 MEQ tablet Take 10 mEq by mouth See admin instructions. Take one tablet daily by mouth 5 days per week   03/23/2019 at Unknown time  . Cholecalciferol (VITAMIN D PO) Take 1 capsule by mouth daily.    03/24/2019   Scheduled: . amiodarone  150 mg Intravenous Once  . chlorhexidine gluconate (MEDLINE KIT)  15 mL Mouth Rinse BID  . Chlorhexidine Gluconate Cloth  6 each Topical Daily  . furosemide  40 mg Intravenous BID  . mouth rinse  15 mL Mouth Rinse 10 times per day  . metoprolol tartrate  5 mg Intravenous Q8H  . pantoprazole (PROTONIX) IV  40 mg Intravenous Q24H  . sodium chloride flush  10-40 mL Intracatheter Q12H  . sodium chloride flush  3 mL Intravenous Q12H   Continuous: . sodium chloride 10 mL/hr at 03/31/19 0600  . amiodarone     Followed by  . amiodarone    . dextrose 5% lactated ringers with KCl 20 mEq/L Stopped (03/31/19 0000)  . fentaNYL infusion INTRAVENOUS 25 mcg/hr (03/31/19 0600)  . heparin 1,000 Units/hr (03/31/19 0600)  . meropenem (MERREM) IV Stopped (03/30/19 2236)   CBS:WHQPRF chloride, acetaminophen **OR** acetaminophen, albuterol, bisacodyl, fentaNYL, midazolam, midazolam, ondansetron **OR** ondansetron (ZOFRAN) IV, phenol, polyethylene glycol, sodium chloride flush, sodium chloride flush, traZODone  Assesment: She was admitted with small bowel obstruction and initially was improving and then developed increasing respiratory distress which culminated in her being intubated and placed on mechanical ventilation.  She has diffuse bilateral infiltrates.  She had previously had bilateral infiltrates thought to be related to  aspiration pneumonia and I think it certainly possible that she aspirated again considering the small bowel obstruction.  She has ARDS and she is being ventilated on the ARDS protocol.  Oxygenation is adequate on 100% chest x-ray looks a little bit better today.  I think it is unlikely that she has COVID 19  She is  about 7 L positive but she is getting Lasix.  Some of her infiltrate could certainly be from volume overload.  She has atrial fib with rapid ventricular response.  Because her blood pressure has been soft I think amiodarone probably is the safest choice  Still critically ill with survival in question Principal Problem:   Partial intestinal obstruction (HCC) Active Problems:   Essential hypertension   Chronic a-fib   PNA (pneumonia)--Aspiration   Respiratory failure (HCC)   Acute diastolic CHF (congestive heart failure) (HCC)   Atrial fibrillation with RVR (HCC)   Elevated troponin   Lobar pneumonia (HCC)   Coronary artery disease due to lipid rich plaque    Plan: Start amiodarone.  Continue other treatments.  Add steroids    LOS: 6 days   Carrie Morales 03/31/2019, 8:28 AM

## 2019-03-31 NOTE — Progress Notes (Signed)
ANTICOAGULATION CONSULT NOTE - Follow Up Consult  Pharmacy Consult for heparin Indication: atrial fibrillation  Labs: Recent Labs    03/28/19 0425 03/29/19 0453 03/30/19 0444 03/30/19 1457 03/31/19 0217  HGB 11.3* 11.8* 12.0  --  11.0*  HCT 36.9 38.8 40.0  --  36.8  PLT 191 223 224  --  168  HEPARINUNFRC 0.68 0.36 <0.10* <0.10* 0.22*  CREATININE 1.14* 0.79  --   --   --     Assessment: 85yo female remains subtherapeutic on heparin after rate changes though now closer to goal; no gtt issues or signs of bleeding per RN.  Goal of Therapy:  Heparin level 0.3-0.7 units/ml   Plan:  Will increase heparin gtt by 2 units/kg/hr to 1000 units/hr and check level in 8 hours.    Wynona Neat, PharmD, BCPS  03/31/2019,2:59 AM

## 2019-03-31 NOTE — Progress Notes (Signed)
ANTICOAGULATION CONSULT NOTE - Follow Up Consult  Pharmacy Consult for heparin Indication: atrial fibrillation  Labs: Recent Labs    03/29/19 0453 03/30/19 0444 03/30/19 1457 03/31/19 0217 03/31/19 1022  HGB 11.8* 12.0  --  11.0*  --   HCT 38.8 40.0  --  36.8  --   PLT 223 224  --  168  --   HEPARINUNFRC 0.36 <0.10* <0.10* 0.22* 0.22*  CREATININE 0.79  --   --  0.81  --     Assessment: 85yo female remains subtherapeutic on heparin after rate changes though now closer to goal; no gtt issues or signs of bleeding per RN. HL 0.22  Goal of Therapy:  Heparin level 0.3-0.7 units/ml   Plan:  Will increase heparin gtt by 3 units/kg/hr to 1300 units/hr and check level in 8 hours.    Thomasenia Sales, PharmD, MBA, BCGP Clinical Pharmacist  03/31/2019,1:29 PM

## 2019-03-31 NOTE — Progress Notes (Signed)
ANTICOAGULATION CONSULT NOTE - Follow Up Consult  Pharmacy Consult for heparin Indication: atrial fibrillation  Labs: Recent Labs    03/29/19 0453 03/30/19 0444  03/31/19 0217 03/31/19 1022 03/31/19 1919  HGB 11.8* 12.0  --  11.0*  --   --   HCT 38.8 40.0  --  36.8  --   --   PLT 223 224  --  168  --   --   HEPARINUNFRC 0.36 <0.10*   < > 0.22* 0.22* <0.10*  CREATININE 0.79  --   --  0.81  --   --    < > = values in this interval not displayed.    Assessment: 85yo female remains subtherapeutic on heparin after rate changes though now closer to goal; no gtt issues or signs of bleeding per RN. HL <0.10, called RN, unfortunately this heparin gtt was hanging but was not connected to any port on the patient. This apparently was the case when night RN came on at 1900. Since it is unknown if the previous 0.22 level was accurate, it is best to restart at this previous rate and check in 8 hours.   Goal of Therapy:  Heparin level 0.3-0.7 units/ml   Plan:  Will reduce heparin gtt to 1000 units/hr and check level in 8 hours.    Thomasenia Sales, PharmD, MBA, BCGP Clinical Pharmacist  03/31/2019,8:09 PM

## 2019-03-31 NOTE — Progress Notes (Signed)
Gastric residual approximately 74mL. NO s.s. of distention, aspiration, or complications with tube feed. Will continue to monitor.

## 2019-04-01 ENCOUNTER — Inpatient Hospital Stay (HOSPITAL_COMMUNITY): Payer: Medicare Other

## 2019-04-01 LAB — MAGNESIUM
Magnesium: 2.2 mg/dL (ref 1.7–2.4)
Magnesium: 2.1 mg/dL (ref 1.7–2.4)

## 2019-04-01 LAB — BASIC METABOLIC PANEL
Anion gap: 6 (ref 5–15)
BUN: 47 mg/dL — ABNORMAL HIGH (ref 8–23)
CO2: 30 mmol/L (ref 22–32)
Calcium: 9.9 mg/dL (ref 8.9–10.3)
Chloride: 107 mmol/L (ref 98–111)
Creatinine, Ser: 1.01 mg/dL — ABNORMAL HIGH (ref 0.44–1.00)
GFR calc Af Amer: 58 mL/min — ABNORMAL LOW (ref >60)
GFR calc non Af Amer: 50 mL/min — ABNORMAL LOW (ref >60)
Glucose, Bld: 188 mg/dL — ABNORMAL HIGH (ref 70–99)
Potassium: 4.4 mmol/L (ref 3.5–5.1)
Sodium: 143 mmol/L (ref 135–145)

## 2019-04-01 LAB — HEPARIN LEVEL (UNFRACTIONATED)
Heparin Unfractionated: 0.14 IU/mL — ABNORMAL LOW (ref 0.30–0.70)
Heparin Unfractionated: 0.53 IU/mL (ref 0.30–0.70)

## 2019-04-01 LAB — CBC
HCT: 41.5 % (ref 36.0–46.0)
Hemoglobin: 13 g/dL (ref 12.0–15.0)
MCH: 30.7 pg (ref 26.0–34.0)
MCHC: 31.3 g/dL (ref 30.0–36.0)
MCV: 98.1 fL (ref 80.0–100.0)
Platelets: 220 10*3/uL (ref 150–400)
RBC: 4.23 MIL/uL (ref 3.87–5.11)
RDW: 14.4 % (ref 11.5–15.5)
WBC: 18 10*3/uL — ABNORMAL HIGH (ref 4.0–10.5)
nRBC: 0 % (ref 0.0–0.2)

## 2019-04-01 LAB — BLOOD GAS, ARTERIAL
Acid-Base Excess: 4.7 mmol/L — ABNORMAL HIGH (ref 0.0–2.0)
Bicarbonate: 27.7 mmol/L (ref 20.0–28.0)
FIO2: 90
O2 Saturation: 97 %
Patient temperature: 37
pCO2 arterial: 53.5 mmHg — ABNORMAL HIGH (ref 32.0–48.0)
pH, Arterial: 7.364 (ref 7.350–7.450)
pO2, Arterial: 99.5 mmHg (ref 83.0–108.0)

## 2019-04-01 LAB — GLUCOSE, CAPILLARY
Glucose-Capillary: 149 mg/dL — ABNORMAL HIGH (ref 70–99)
Glucose-Capillary: 152 mg/dL — ABNORMAL HIGH (ref 70–99)
Glucose-Capillary: 155 mg/dL — ABNORMAL HIGH (ref 70–99)
Glucose-Capillary: 165 mg/dL — ABNORMAL HIGH (ref 70–99)
Glucose-Capillary: 175 mg/dL — ABNORMAL HIGH (ref 70–99)
Glucose-Capillary: 182 mg/dL — ABNORMAL HIGH (ref 70–99)
Glucose-Capillary: 182 mg/dL — ABNORMAL HIGH (ref 70–99)

## 2019-04-01 LAB — PHOSPHORUS
Phosphorus: 2.9 mg/dL (ref 2.5–4.6)
Phosphorus: 2.9 mg/dL (ref 2.5–4.6)

## 2019-04-01 MED ORDER — INSULIN ASPART 100 UNIT/ML ~~LOC~~ SOLN
5.0000 [IU] | SUBCUTANEOUS | Status: DC
  Administered 2019-04-01 – 2019-04-02 (×7): 5 [IU] via SUBCUTANEOUS

## 2019-04-01 MED ORDER — HEPARIN BOLUS VIA INFUSION
1500.0000 [IU] | Freq: Once | INTRAVENOUS | Status: AC
  Administered 2019-04-01: 1500 [IU] via INTRAVENOUS
  Filled 2019-04-01: qty 1500

## 2019-04-01 NOTE — Progress Notes (Signed)
PROGRESS NOTE  Carrie Morales LKG:[id number] DOB: February 10, 1928 DOA: 04/06/2019 PCP: Earney Mallet, MD  Brief History:  85 year old female with a history of chronic atrial fibrillation on apixaban, diastolic CHF, hypertension, bowel obstruction status post bowel resection 30 years ago presenting with 1 week history of abdominal pain that worsened over the past 2 to 3 days to the point of resulting in nausea and vomiting.  The patient was also having constipation-like symptoms and tried some over-the-counter laxatives without relief.  Because of her progressive symptoms she presented for further evaluation.  CT of the abdomen and pelvis in the ED showed left basilar collapse less consolidative changes, small bilateral pleural effusions, left greater than right with a large hiatus hernia.  There was also dilatation of the proximal and mid small bowel loops up to 2.9 cm consistent with a high-grade SBO.  The patient was treated with bowel rest and IV fluids.  When she arrived to the medical floor, the patient developed atrial fibrillation with RVR and respiratory distress.  She was transferred to the stepdown unit and started on diltiazem drip and placed on BiPAP.  Subsequently, the patient became hypotensive requiring fluid resuscitation.  Repeat chest x-ray show worsening bilateral perihilar densities with stable bibasilar opacifications.  Lactic acid peaked at 4.4.  The patient was started on ceftriaxone and metronidazole.  Assessment/Plan: Sepsis -Present at the time of admission -Secondary to aspiration pneumonia/CAP -Lactic acid peaked at 4.4; now normal. -procalcitonin  Peaked at 5.86 -WBCs started to trend down; which is something that code changed now that the patient will be started on a steroids. -Follow blood culture--neg so far -Urinalysis negative -Continue current IV antibiotics and supportive care.  Acute respiratory failure with hypoxia hypercapnia -Secondary to  pneumonia, ARDS and acute on chronic heart failure. -Patient failed response of BiPAP and in the requiring intubation and  mechanical ventilation -Follow ARDS protocol -Appreciate pulmonology's assistance and recommendations -Continue current IV antibiotics -Continue Solu-Medrol 40 mg every 12 hours as recommended by pulmonologist. -continue Diuresis as tolerated -ABG demonstrating improvement in PO2 and CO2 -Follow clinical response.  Small bowel obstruction -General surgery consult appreciated -6/10--case discussed with Dr. Constance Haw -Patient having bowel movements -NG tube has been discontinued following general surgery recommendations. -Continue electrolytes repletion. -Continue Protonix -Following stability will ended requiring tube feedings while intubated. -Anticipating clamping trial later today. -Continue tube feedings; slowly increase to goal.  No significant signs of gastric residual, distention or aspiration. -Repeated 2 views abdominal x-ray demonstrated improvement in the small bowel dilatation and not new focal abnormality.  Atrial fibrillation with RVR -Precipitated by her acute medical condition -Will continue metoprolol and amiodarone. -Heart rate is better controlled -Continue heparin drip -Overall with improved rate control. -Echocardiogram--EF >65%, no WMA, mod MR -Appreciate cardiology consult  Acute on chronic diastolic CHF -Continue daily weights -Follow strict I's and O's -Continue IV Lasix 40 mg every 12 hours -Follow volume status. -Good urine output appreciated.  Acute on chronic renal failure: Stage III at baseline.   -Secondary to hemodynamic changes and hypotension. -Baseline creatinine~0.8-1.1 -monitor with diuresis -Creatinine peak of 1.3; and then down to 1.1 -Will continue to monitor trend especially while receiving IV Lasix now.  Hyperglycemia -check A1C--5.3 -Elevated blood sugars in the setting of stress  demargination.   Disposition Plan:   Remain in ICU Family Communication:   Son updated at bedside 6/13  Consultants:  General surgery; Cardiology; pulmonology  Code Status:  FULL  DVT Prophylaxis:   Continue IV Heparin   Procedures: As Listed in Progress Note Above  Antibiotics: Ceftriaxone/flagyl 6/7>>6/8 Merrem 6/8>>>  Patient remains critically ill with multiple organ system failure requiring high complexity decision making for assessment and support, frequent evaluation and titration of therapies, application of advance monitoring technologies and extensive interpretation of multiple databases.  Patient's son was updated at bedside and all questions were answered to his satisfaction.  Case discussed with pulmonologist.  Plan of care arranged/dictated and share with nursing staff.  Time 35 minutes.   Subjective: Patient remains intubated and mechanically ventilated.  No fever.  Has been able to decrease oxygen saturation to 80%, still requiring PEEP of 10.  Tolerating tube feedings.  Heart rate is better controlled.   Objective: Vitals:   04/01/19 0930 04/01/19 1000 04/01/19 1141 04/01/19 1200  BP: 140/85 116/72 112/70 115/74  Pulse:      Resp: (!) 27 (!) 30 (!) 26 (!) 27  Temp:  98.1 F (36.7 C)    TempSrc:  Axillary    SpO2: 98% 95% 96% 94%  Weight:      Height:        Intake/Output Summary (Last 24 hours) at 04/01/2019 1311 Last data filed at 04/01/2019 1151 Gross per 24 hour  Intake 1129.96 ml  Output 1480 ml  Net -350.04 ml   Weight change:   Exam: General exam: Alert, awake, oriented x 3; intubated and mechanically ventilated.  Good urine output reported.  So far tolerating tube feedings.  Afebrile. Respiratory system: Positive rhonchi, no wheezing, decreased breath sounds at the bases. Cardiovascular system: Irregular, no rubs, no gallops, positive systolic ejection murmur. Gastrointestinal system: Abdomen is nondistended, soft and nontender.  No organomegaly or masses felt. Normal bowel sounds heard. Central nervous system: Alert and oriented. No focal neurological deficits. Extremities: No C/C/E, +pedal pulses Skin: No rashes, lesions or ulcers Psychiatry: Mood & affect appropriate.   Data Reviewed: I have personally reviewed following labs and imaging studies   Basic Metabolic Panel: Recent Labs  Lab 03/27/19 0612 03/28/19 0425 03/29/19 0453 03/31/19 0217 03/31/19 1245 03/31/19 1919 04/01/19 0450  NA 140 139 141 146*  --   --  143  K 4.2 4.3 4.6 6.1*  --   --  4.4  CL 106 107 108 112*  --   --  107  CO2 '26 24 29 27  '$ --   --  30  GLUCOSE 89 132* 141* 318*  --   --  188*  BUN 38* 44* 39* 35*  --   --  47*  CREATININE 1.32* 1.14* 0.79 0.81  --   --  1.01*  CALCIUM 9.2 9.4 9.8 9.6  --   --  9.9  MG 2.0  --  2.3  --  2.1 2.1 2.2  PHOS 3.1  --   --   --  2.7 3.0 2.9   Liver Function Tests: Recent Labs  Lab 03/27/19 0612 03/28/19 0425  AST 18 16  ALT 17 16  ALKPHOS 40 53  BILITOT 1.0 1.2  PROT 5.3* 5.2*  ALBUMIN 2.5* 2.4*   CBC: Recent Labs  Lab 03/28/19 0425 03/29/19 0453 03/30/19 0444 03/31/19 0217 04/01/19 0450  WBC 18.1* 18.0* 17.8* 14.9* 18.0*  HGB 11.3* 11.8* 12.0 11.0* 13.0  HCT 36.9 38.8 40.0 36.8 41.5  MCV 97.6 98.5 99.5 100.5* 98.1  PLT 191 223 224 168 220   CBG: Recent Labs  Lab 03/31/19 1944 04/01/19 0019 04/01/19 0451 04/01/19  0744 04/01/19 1135  GLUCAP 135* 149* 155* 152* 175*   Urine analysis:    Component Value Date/Time   COLORURINE AMBER (A) 04/11/2019 1158   APPEARANCEUR HAZY (A) 04/14/2019 1158   LABSPEC 1.027 04/01/2019 1158   PHURINE 5.0 03/19/2019 1158   GLUCOSEU NEGATIVE 03/28/2019 1158   HGBUR NEGATIVE 04/01/2019 1158   BILIRUBINUR NEGATIVE 04/06/2019 1158   KETONESUR 20 (A) 04/05/2019 1158   PROTEINUR 100 (A) 04/17/2019 1158   UROBILINOGEN 0.2 07/24/2014 1534   NITRITE NEGATIVE 04/01/2019 1158   LEUKOCYTESUR NEGATIVE 04/11/2019 1158    Recent Results  (from the past 240 hour(s))  SARS Coronavirus 2 (CEPHEID - Performed in [facility] hospital lab), Hosp Order     Status: None   Collection Time: 03/31/2019  2:26 PM   Specimen: Nasopharyngeal Swab  Result Value Ref Range Status   SARS Coronavirus 2 NEGATIVE NEGATIVE Final    Comment: (NOTE) If result is NEGATIVE SARS-CoV-2 target nucleic acids are NOT DETECTED. The SARS-CoV-2 RNA is generally detectable in upper and lower  respiratory specimens during the acute phase of infection. The lowest  concentration of SARS-CoV-2 viral copies this assay can detect is 250  copies / mL. A negative result does not preclude SARS-CoV-2 infection  and should not be used as the sole basis for treatment or other  patient management decisions.  A negative result may occur with  improper specimen collection / handling, submission of specimen other  than nasopharyngeal swab, presence of viral mutation(s) within the  areas targeted by this assay, and inadequate number of viral copies  (<250 copies / mL). A negative result must be combined with clinical  observations, patient history, and epidemiological information. If result is POSITIVE SARS-CoV-2 target nucleic acids are DETECTED. The SARS-CoV-2 RNA is generally detectable in upper and lower  respiratory specimens dur ing the acute phase of infection.  Positive  results are indicative of active infection with SARS-CoV-2.  Clinical  correlation with patient history and other diagnostic information is  necessary to determine patient infection status.  Positive results do  not rule out bacterial infection or co-infection with other viruses. If result is PRESUMPTIVE POSTIVE SARS-CoV-2 nucleic acids MAY BE PRESENT.   A presumptive positive result was obtained on the submitted specimen  and confirmed on repeat testing.  While 2019 novel coronavirus  (SARS-CoV-2) nucleic acids may be present in the submitted sample  additional confirmatory testing may be  necessary for epidemiological  and / or clinical management purposes  to differentiate between  SARS-CoV-2 and other Sarbecovirus currently known to infect humans.  If clinically indicated additional testing with an alternate test  methodology [phone]) is advised. The SARS-CoV-2 RNA is generally  detectable in upper and lower respiratory sp ecimens during the acute  phase of infection. The expected result is Negative. Fact Sheet for Patients:  StrictlyIdeas.no Fact Sheet for Healthcare Providers: BankingDealers.co.za This test is not yet approved or cleared by the [place] FDA and has been authorized for detection and/or diagnosis of SARS-CoV-2 by FDA under an Emergency Use Authorization (EUA).  This EUA will remain in effect (meaning this test can be used) for the duration of the COVID-19 declaration under Section 564(b)(1) of the Act, 21 U.S.C. section 360bbb-3(b)(1), unless the authorization is terminated or revoked sooner. Performed at [facility], [address] [place], [place] [address]   MRSA PCR Screening     Status: None   Collection Time: 03/24/2019  7:54 PM   Specimen: Nasal Mucosa;  Nasopharyngeal  Result Value Ref Range Status   MRSA by PCR NEGATIVE NEGATIVE Final    Comment:        The GeneXpert MRSA Assay (FDA approved for NASAL specimens only), is one component of a comprehensive MRSA colonization surveillance program. It is not intended to diagnose MRSA infection nor to guide or monitor treatment for MRSA infections. Performed at [facility], [address], [place], [place] [address]   Culture, blood (Routine X 2) w Reflex to ID Panel     Status: None   Collection Time: 03/21/2019  9:50 PM   Specimen: Left Antecubital; Blood  Result Value Ref Range Status   Specimen Description LEFT ANTECUBITAL  Final   Special Requests   Final    BOTTLES DRAWN AEROBIC AND ANAEROBIC Blood Culture adequate volume   Culture    Final    NO GROWTH 5 DAYS Performed at [facility], [address], [place], [place] [address]    Report Status 03/30/2019 FINAL  Final     Scheduled Meds:  chlorhexidine gluconate (MEDLINE KIT)  15 mL Mouth Rinse BID   Chlorhexidine Gluconate Cloth  6 each Topical Daily   feeding supplement (VITAL HIGH PROTEIN)  1,000 mL Per Tube Q24H   furosemide  40 mg Intravenous BID   mouth rinse  15 mL Mouth Rinse 10 times per day   methylPREDNISolone (SOLU-MEDROL) injection  40 mg Intravenous Q12H   metoprolol tartrate  5 mg Intravenous Q8H   pantoprazole (PROTONIX) IV  40 mg Intravenous Q24H   sodium chloride flush  10-40 mL Intracatheter Q12H   sodium chloride flush  3 mL Intravenous Q12H   Continuous Infusions:  sodium chloride 10 mL/hr at 03/31/19 0600   amiodarone 30 mg/hr (04/01/19 1151)   fentaNYL infusion INTRAVENOUS 50 mcg/hr (04/01/19 1151)   heparin 1,300 Units/hr (04/01/19 1151)   meropenem (MERREM) IV Stopped (04/01/19 0909)    Procedures/Studies: Ct Chest Wo Contrast  Result Date: 03/27/2019 CLINICAL DATA:  Acute respiratory illness.  Hypoxia. EXAM: CT CHEST WITHOUT CONTRAST TECHNIQUE: Multidetector CT imaging of the chest was performed following the standard protocol without IV contrast. COMPARISON:  None FINDINGS: Cardiovascular: Moderate cardiac enlargement and small pericardial effusion. Aortic atherosclerosis. Calcification in the left main, left circumflex coronary arteries identified. Mediastinum/Nodes: Normal appearance of the thyroid gland. The trachea appears patent and is midline. Large hiatal hernia. Nasogastric tube is identified. The tube forms a loop within the hiatal hernia and then continues into the intra-abdominal stomach. Right paratracheal lymph node measures 1.5 cm, image 53/4. Left paratracheal lymph node measures 1.6 cm, image 43/4. Subcarinal lymph node measures 2.1 cm. Hilar structures are suboptimally evaluated due to lack of IV contrast  material. No axillary or supraclavicular adenopathy. Lungs/Pleura: Small bilateral pleural effusions identified. The left pleural effusion appears mildly loculated. There is subsegmental scratch set partial consolidation in atelectasis of the right lower lobe noted. There is complete airspace consolidation of the entire left lower lobe. Partial consolidation in atelectasis of the lingula. Patchy ground-glass and airspace densities noted within the right middle lobe and central left upper lobe. Upper Abdomen: No acute abnormality. Musculoskeletal: Bones appear osteopenic. Kyphoscoliosis deformity of the thoracic spine noted. IMPRESSION: 1. Small to moderate bilateral pleural effusions. Multifocal, bilateral airspace consolidation identified compatible with pneumonia. The left lower lobe is completely consolidated/collapsed. There is partial consolidation/collapse of the right lower lobe and lingula. Patchy ground-glass and airspace densities are noted within the right middle lobe and both upper lobes. 2. Enlarged  mediastinal lymph nodes. In the setting of CHF and pneumonia these are nonspecific and may be reactive. Follow-up imaging with CT of the chest in 3 months is recommended. This recommendation follows ACR consensus guidelines: Managing Incidental Findings on Thoracic CT: Mediastinal and Cardiovascular Findings. A White Paper of the ACR Incidental Findings Committee. J Am Coll Radiol. 2018; 15: 6226-3335. 3. Cardiac enlargement and small pericardial effusion 4. Aortic Atherosclerosis (ICD10-I70.0). Coronary artery calcifications. Electronically Signed   By: Kerby Moors M.D.   On: 03/27/2019 15:36   Ct Abdomen Pelvis W Contrast  Result Date: 03/19/2019 CLINICAL DATA:  Constipation since Thursday. Vomiting. Hysterectomy. Appendectomy. High-grade bowel obstruction. EXAM: CT ABDOMEN AND PELVIS WITH CONTRAST TECHNIQUE: Multidetector CT imaging of the abdomen and pelvis was performed using the standard protocol  following bolus administration of intravenous contrast. CONTRAST:  172m OMNIPAQUE IOHEXOL 300 MG/ML  SOLN COMPARISON:  None. FINDINGS: Lower chest: Right base atelectasis. Left base collapse/consolidative change. Moderate cardiomegaly with small pericardial effusion. Small bilateral pleural effusions. A large hiatal hernia, with approximately 1/2 of the stomach positioned in the lower chest. Hepatobiliary: Anterior left hepatic lobe cyst. Cholecystectomy, without biliary ductal dilatation. Pancreas: Normal, without mass or ductal dilatation. Spleen: Normal in size, without focal abnormality. Adrenals/Urinary Tract: Normal adrenal glands. Bilateral renal low-density lesions which are likely cysts and minimally complex cysts. Other renal lesions are too small to characterize. No hydronephrosis. Normal urinary bladder. Stomach/Bowel: The stomach is distended and fluid-filled. The left side of the colon is relatively decompressed with scattered diverticula. Proximal and mid small bowel loops are dilated including at up to 2.9 cm. This continues to the level of a transition point within the central pelvis, likely in the proximal ileum on image 55/3 and coronal image 41. No obstructive mass identified. No complicating ischemia. Vascular/Lymphatic: Aortic and branch vessel atherosclerosis. No abdominopelvic adenopathy. Reproductive: Hysterectomy.  No adnexal mass. Other: Mild pelvic floor laxity.  No free intraperitoneal air. Musculoskeletal: Lumbosacral spondylosis with moderate convex left lumbar spine curvature. IMPRESSION: 1. High-grade partial small-bowel obstruction, to the level of the mid small bowel. Most likely related to adhesions. No complicating ischemia. 2. Distended stomach with large hiatal hernia. The patient may benefit from nasogastric tube placement. 3. Small bilateral pleural effusions with left worse than right base airspace disease. On the right, likely atelectasis. On the left, infection or  aspiration cannot be excluded. 4. Cardiomegaly with small pericardial effusion. 5.  Aortic Atherosclerosis (ICD10-I70.0). Electronically Signed   By: KAbigail MiyamotoM.D.   On: 03/30/2019 14:02   Dg Chest Port 1 View  Result Date: 04/01/2019 CLINICAL DATA:  Acute respiratory failure. Aspiration pneumonia. EXAM: PORTABLE CHEST 1 VIEW COMPARISON:  03/31/2019 FINDINGS: Support lines and tubes in appropriate position. Stable cardiomegaly. Diffuse bilateral airspace disease shows no significant change. Probable small bilateral pleural effusions, also stable. IMPRESSION: No significant change in diffuse bilateral airspace disease and probable bilateral pleural effusions. Stable cardiomegaly. Electronically Signed   By: JEarle GellM.D.   On: 04/01/2019 08:12   Dg Chest Port 1 View  Result Date: 03/31/2019 CLINICAL DATA:  Respiratory failure. EXAM: PORTABLE CHEST 1 VIEW COMPARISON:  03/30/2019 FINDINGS: The patient is rotated to the left, and the patient's chin partially obscures the left lung apex. An endotracheal tube remains in place terminating approximately 1.5 cm of above the carina, not significantly changed. An enteric tube courses into the abdomen with tip not imaged. A left PICC terminates over the right atrium. There is persistent extensive airspace opacity throughout  both lungs, stable to minimally improved. IMPRESSION: Stable to minimally improved extensive bilateral airspace disease. Electronically Signed   By: Logan Bores M.D.   On: 03/31/2019 09:39   Dg Chest Port 1 View  Result Date: 03/30/2019 CLINICAL DATA:  Shortness of breath. EXAM: PORTABLE CHEST 1 VIEW COMPARISON:  03/27/2019 FINDINGS: The heart is enlarged but stable.  Left PICC line appears stable. Severe and progressive bilateral airspace process. Persistent small pleural effusions. No pneumothorax. IMPRESSION: Severe and progressive bilateral airspace process. Electronically Signed   By: Marijo Sanes M.D.   On: 03/30/2019 10:41   Dg  Chest Port 1 View  Result Date: 03/27/2019 CLINICAL DATA:  Acute respiratory failure with hypoxia. EXAM: PORTABLE CHEST 1 VIEW COMPARISON:  One-view chest x-ray 03/26/2019 FINDINGS: Heart is enlarged. Left hemidiaphragm is elevated. NG tube is in the stomach. Left-sided PICC line terminates in the right atrium. Pulmonary vascular congestion is increased. Bilateral infiltrates have increased. IMPRESSION: 1. Increasing bilateral infiltrates, left greater than right. This is concerning for infection. 2. Elevation of left hemidiaphragm. 3. Stable cardiomegaly. 4. Left-sided PICC line terminates in the right atrium. Electronically Signed   By: San Morelle M.D.   On: 03/27/2019 08:37   Dg Chest Port 1 View  Result Date: 03/26/2019 CLINICAL DATA:  Follow-up PICC line placement EXAM: PORTABLE CHEST 1 VIEW COMPARISON:  04/05/2019 FINDINGS: Left-sided PICC line is noted with catheter tip at the cavoatrial junction. Gastric catheter is noted coiled within the stomach. Elevation of the left hemidiaphragm is noted with some associated infiltrates bilaterally left greater than right similar to that seen on the prior exam. No bony abnormality is noted. IMPRESSION: Stable infiltrates bilaterally left greater than right. New left-sided PICC line in satisfactory position. Electronically Signed   By: Inez Catalina M.D.   On: 03/26/2019 12:46   Dg Chest Port 1 View  Result Date: 03/29/2019 CLINICAL DATA:  NG tube placement. EXAM: PORTABLE CHEST 1 VIEW COMPARISON:  04/03/2019 and CT 04/16/2019 FINDINGS: Patient is slightly rotated to the left. Nasogastric tube is coiled once over the stomach in patient's known large hiatal hernia and then proceeds inferiorly over the left upper quadrant and off the film as tip is not visualized. Non radiopaque side-port is over the stomach in the left upper abdomen. Exam demonstrates adequate lung volumes with worsening bilateral perihilar opacification suggesting interstitial edema and  less likely infection. Persistent opacification over the left base likely effusion with atelectasis. Stable mild hazy density over the right base which may represent a small effusion with atelectasis. Infection over the lung bases is possible. Stable cardiomegaly. Remainder of the exam is unchanged. IMPRESSION: Worsening bilateral perihilar opacification suggesting worsening interstitial edema. Stable bibasilar opacification likely small effusions left greater than right with associated basilar atelectasis. Infection in the mid to lower lungs is possible. Stable cardiomegaly. Nasogastric tube coiled once over the large hiatal hernia with side-port over the stomach in the left upper quadrant and tip not visualized. Electronically Signed   By: Marin Olp M.D.   On: 04/17/2019 20:28   Dg Chest Portable 1 View  Result Date: 04/04/2019 CLINICAL DATA:  Nasogastric tube placement EXAM: PORTABLE CHEST 1 VIEW COMPARISON:  Abdominal CT from earlier today FINDINGS: Nasogastric tube which at least reaches the stomach (which is low lying and distended by CT). There is haziness at both lung bases where there is atelectasis and pleural fluid by prior CT. Cardiopericardial enlargement. IMPRESSION: 1. The nasogastric tube reaches the stomach at least. 2. Cardiomegaly with  bilateral pleural effusion obscuring the lower lobes. Electronically Signed   By: Monte Fantasia M.D.   On: 04/04/2019 16:36   Dg Chest Port 1v Same Day  Result Date: 03/30/2019 CLINICAL DATA:  Status post intubation and OG tube placement today. EXAM: PORTABLE CHEST 1 VIEW COMPARISON:  Single-view of the chest earlier today. FINDINGS: Endotracheal tube tip is 1.5 cm above the carina. The tube could be withdrawn 1-2 cm for better positioning. OG tube courses into the stomach and below the inferior margin the film. Severe and diffuse bilateral airspace disease is again seen. Cardiac silhouette is obscured. No pneumothorax is identified. IMPRESSION: ETT tip  is 1.5 cm above the carina. The tube could be withdrawn 1-2 cm. OG tube courses into the stomach and below the inferior margin the film. No change in diffuse bilateral airspace disease. Electronically Signed   By: Inge Rise M.D.   On: 03/30/2019 11:53   Dg Abd 2 Views  Result Date: 03/30/2019 CLINICAL DATA:  Follow up small bowel obstruction EXAM: ABDOMEN - 2 VIEW COMPARISON:  03/26/2019 FINDINGS: Stable scoliosis is noted. Bibasilar infiltrative changes are seen. A few persistent loops of dilated small bowel are seen. No free air is noted. The overall appearance has improved slightly in the interval from the prior exam. IMPRESSION: Slight improvement in mild small bowel dilatation. No new focal abnormality is seen. Electronically Signed   By: Inez Catalina M.D.   On: 03/30/2019 08:08   Dg Abd Portable 2v  Result Date: 03/26/2019 CLINICAL DATA:  Small bowel obstruction, colon resection. EXAM: PORTABLE ABDOMEN - 2 VIEW COMPARISON:  CT abdomen pelvis 03/23/2019. FINDINGS: Nasogastric tube terminates in the stomach. Persistent small bowel dilatation. No minimal colonic gas. Retained contrast in the bladder with Foley catheter in place. Postoperative changes in the left lower quadrant. Incidental imaging of the lower chest shows left lower lobe consolidation with patchy airspace opacification bilaterally. Bilateral pleural effusions are noted as well. IMPRESSION: 1. Persistent small bowel obstruction. 2. Left lower lobe consolidation with patchy bilateral airspace opacification and bilateral pleural effusions, better evaluated on chest radiograph done the same day. Electronically Signed   By: Lorin Picket M.D.   On: 03/26/2019 12:46    Barton Dubois, MD  [facility] Pager [phone]   04/01/2019, 1:11 PM   LOS: 7 days

## 2019-04-01 NOTE — Progress Notes (Signed)
Gastric residual approx 81mL. No s/sx of distention, aspiration, or complications. Continuing tube feed at 4mL/hr at this time.

## 2019-04-01 NOTE — Progress Notes (Signed)
Gastric residual approximately 32mL. NO s/s of distention, aspiration, or complications with tube feed. Will continue to monitor.

## 2019-04-01 NOTE — Progress Notes (Signed)
Gastric residual approx 49mL. No s/sx of distention, aspiration, or complications. Continuing tube feed at 86mL/hr at this time.

## 2019-04-01 NOTE — Progress Notes (Signed)
[facility] Progress Note     Subjective: Tolerating his TF. Had a BM last night. Up to 40 cc / hr.   Objective: Vital signs in last 24 hours: Temp:  [97.6 F (36.4 C)-98.6 F (37 C)] 98.1 F (36.7 C) (06/14 1000) Pulse Rate:  [110] 110 (06/14 0730) Resp:  [16-30] 30 (06/14 1000) BP: (86-141)/(53-99) 116/72 (06/14 1000) SpO2:  [95 %-100 %] 95 % (06/14 1000) FiO2 (%):  [75 %-100 %] 75 % (06/14 0929) Weight:  [51.3 kg] 51.3 kg (06/14 0500) Last BM Date: 04/01/19  Intake/Output from previous day: 06/13 0701 - 06/14 0700 In: 1095.1 [I.V.:878.5; NG/GT:16.7; IV Piggyback:200] Out: 1030 [Urine:950; Emesis/NG output:80] Intake/Output this shift: Total I/O In: 229.6 [I.V.:129.6; IV Piggyback:100] Out: -   General appearance: alert, cooperative and no distress Resp: intubated, comfortable GI: soft, mildly distended, nontender  Lab Results:  Recent Labs    03/31/19 0217 04/01/19 0450  WBC 14.9* 18.0*  HGB 11.0* 13.0  HCT 36.8 41.5  PLT 168 220   BMET Recent Labs    03/31/19 0217 04/01/19 0450  NA 146* 143  K 6.1* 4.4  CL 112* 107  CO2 27 30  GLUCOSE 318* 188*  BUN 35* 47*  CREATININE 0.81 1.01*  CALCIUM 9.6 9.9   PT/INR No results for input(s): LABPROT, INR in the last 72 hours.  Studies/Results: Dg Chest Port 1 View  Result Date: 04/01/2019 CLINICAL DATA:  Acute respiratory failure. Aspiration pneumonia. EXAM: PORTABLE CHEST 1 VIEW COMPARISON:  03/31/2019 FINDINGS: Support lines and tubes in appropriate position. Stable cardiomegaly. Diffuse bilateral airspace disease shows no significant change. Probable small bilateral pleural effusions, also stable. IMPRESSION: No significant change in diffuse bilateral airspace disease and probable bilateral pleural effusions. Stable cardiomegaly. Electronically Signed   By: Myles RosenthalJohn  Stahl M.D.   On: 04/01/2019 08:12   Dg Chest Port 1 View  Result Date: 03/31/2019 CLINICAL DATA:  Respiratory failure. EXAM:  PORTABLE CHEST 1 VIEW COMPARISON:  03/30/2019 FINDINGS: The patient is rotated to the left, and the patient's chin partially obscures the left lung apex. An endotracheal tube remains in place terminating approximately 1.5 cm of above the carina, not significantly changed. An enteric tube courses into the abdomen with tip not imaged. A left PICC terminates over the right atrium. There is persistent extensive airspace opacity throughout both lungs, stable to minimally improved. IMPRESSION: Stable to minimally improved extensive bilateral airspace disease. Electronically Signed   By: Sebastian AcheAllen  Grady M.D.   On: 03/31/2019 09:39   Dg Chest Port 1v Same Day  Result Date: 03/30/2019 CLINICAL DATA:  Status post intubation and OG tube placement today. EXAM: PORTABLE CHEST 1 VIEW COMPARISON:  Single-view of the chest earlier today. FINDINGS: Endotracheal tube tip is 1.5 cm above the carina. The tube could be withdrawn 1-2 cm for better positioning. OG tube courses into the stomach and below the inferior margin the film. Severe and diffuse bilateral airspace disease is again seen. Cardiac silhouette is obscured. No pneumothorax is identified. IMPRESSION: ETT tip is 1.5 cm above the carina. The tube could be withdrawn 1-2 cm. OG tube courses into the stomach and below the inferior margin the film. No change in diffuse bilateral airspace disease. Electronically Signed   By: Drusilla Kannerhomas  Dalessio M.D.   On: 03/30/2019 11:53    Anti-infectives: Anti-infectives (From admission, onward)   Start     Dose/Rate Route Frequency Ordered Stop   03/27/19 1045  azithromycin (ZITHROMAX) 500 mg in sodium chloride 0.9 %  250 mL IVPB  Status:  Discontinued     500 mg 250 mL/hr over 60 Minutes Intravenous Every 24 hours 03/27/19 1038 03/30/19 1236   03/26/19 0900  meropenem (MERREM) 1,000 mg in sodium chloride 0.9 % 100 mL IVPB     1,000 mg 200 mL/hr over 30 Minutes Intravenous Every 12 hours 03/26/19 0814     2019/04/04 2200  cefTRIAXone  (ROCEPHIN) 2 g in sodium chloride 0.9 % 100 mL IVPB  Status:  Discontinued     2 g 200 mL/hr over 30 Minutes Intravenous Every 24 hours April 04, 2019 2044 03/26/19 0814   April 04, 2019 2100  metroNIDAZOLE (FLAGYL) IVPB 500 mg  Status:  Discontinued     500 mg 100 mL/hr over 60 Minutes Intravenous Every 8 hours 04/04/2019 2044 03/26/19 6314      Assessment/Plan: Ms. Rowe is an 85 yo with resolving SBO. Doing well on TF. Intubated for respiratory failure related to PNA.  -Continue TF to goal, having Bms -Notify me with any changes or issues    LOS: 7 days    Virl Cagey 04/01/2019

## 2019-04-01 NOTE — Progress Notes (Signed)
MD [place] notified of CBG 182 at this time. Pt's tube feed was upped to 74mL/hr approx 90 minutes ago.

## 2019-04-01 NOTE — Progress Notes (Signed)
Subjective: She remains intubated and on the ventilator.  She is tolerating tube feedings well so far.  Oxygen has been able to be weaned down to 80%.  Her heart rate is better on amiodarone.  Blood pressure looks better.  Still about 7 L positive from admission but only about 65 cc yesterday  Objective: Vital signs in last 24 hours: Temp:  [97.5 F (36.4 C)-98.6 F (37 C)] 98 F (36.7 C) (06/14 0400) Pulse Rate:  [110] 110 (06/14 0730) Resp:  [16-33] 24 (06/14 0730) BP: (86-155)/(53-99) 106/76 (06/14 0730) SpO2:  [95 %-100 %] 97 % (06/14 0730) FiO2 (%):  [90 %-100 %] 90 % (06/14 0400) Weight:  [51.3 kg] 51.3 kg (06/14 0500) Weight change:  Last BM Date: 04/01/19  Intake/Output from previous day: 06/13 0701 - 06/14 0700 In: 1095.1 [I.V.:878.5; NG/GT:16.7; IV Piggyback:200] Out: 1030 [Urine:950; Emesis/NG output:80]  PHYSICAL EXAM General appearance: Intubated on sedation but awake and arousable. Resp: She has bilateral rales and rhonchi Cardio: regular rate and rhythm, S1, S2 normal, no murmur, click, rub or gallop GI: soft, non-tender; bowel sounds normal; no masses,  no organomegaly Extremities: extremities normal, atraumatic, no cyanosis or edema  Lab Results:  Results for orders placed or performed during the hospital encounter of 04/03/2019 (from the past 48 hour(s))  Blood gas, arterial     Status: Abnormal   Collection Time: 03/30/19  9:13 AM  Result Value Ref Range   FIO2 100.00    pH, Arterial 7.363 7.350 - 7.450   pCO2 arterial 53.2 (H) 32.0 - 48.0 mmHg   pO2, Arterial 43.1 (L) 83.0 - 108.0 mmHg   Bicarbonate 27.0 20.0 - 28.0 mmol/L   Acid-Base Excess 4.4 (H) 0.0 - 2.0 mmol/L   O2 Saturation 75.4 %   Patient temperature 36.8    Allens test (pass/fail) RADIAL (A) PASS    Comment: Performed at [facility], [address], [place], [place] [address]  Blood gas, arterial     Status: Abnormal   Collection Time: 03/30/19  1:25 PM  Result Value Ref Range   FIO2  100.00    pH, Arterial 7.316 (L) 7.350 - 7.450   pCO2 arterial 58.3 (H) 32.0 - 48.0 mmHg   pO2, Arterial 63.2 (L) 83.0 - 108.0 mmHg   Bicarbonate 26.0 20.0 - 28.0 mmol/L   Acid-Base Excess 3.2 (H) 0.0 - 2.0 mmol/L   O2 Saturation 89.4 %   Patient temperature 37.1    Allens test (pass/fail) RADIAL (A) PASS    Comment: Performed at [facility], [address] [place], [place] [address]  Heparin level (unfractionated)     Status: Abnormal   Collection Time: 03/30/19  2:57 PM  Result Value Ref Range   Heparin Unfractionated <0.10 (L) 0.30 - 0.70 IU/mL    Comment: (NOTE) If heparin results are below expected values, and patient dosage has  been confirmed, suggest follow up testing of antithrombin III levels. Performed at [facility], [address] [place], [place] [address]   Blood gas, arterial     Status: Abnormal   Collection Time: 03/30/19  6:50 PM  Result Value Ref Range   FIO2 100.00    Delivery systems VENTILATOR    pH, Arterial 7.370 7.350 - 7.450   pCO2 arterial 52.3 (H) 32.0 - 48.0 mmHg   pO2, Arterial 69.4 (L) 83.0 - 108.0 mmHg   Bicarbonate 27.6 20.0 - 28.0 mmol/L   Acid-Base Excess 4.5 (H) 0.0 - 2.0 mmol/L   O2 Saturation  93.0 %   Patient temperature 37.0    Allens test (pass/fail) PASS PASS    Comment: Performed at [facility], [address] [place], [place] [address]  Glucose, capillary     Status: Abnormal   Collection Time: 03/30/19  8:30 PM  Result Value Ref Range   Glucose-Capillary 143 (H) 70 - 99 mg/dL   Comment 1 Notify RN    Comment 2 Document in Chart   Glucose, capillary     Status: Abnormal   Collection Time: 03/31/19 12:04 AM  Result Value Ref Range   Glucose-Capillary 139 (H) 70 - 99 mg/dL   Comment 1 Notify RN    Comment 2 Document in Chart   CBC     Status: Abnormal   Collection Time: 03/31/19  2:17 AM  Result Value Ref Range   WBC 14.9 (H) 4.0 - 10.5 K/uL   RBC 3.66 (L) 3.87 - 5.11 MIL/uL   Hemoglobin 11.0 (L) 12.0 - 15.0 g/dL   HCT  36.8 36.0 - 46.0 %   MCV 100.5 (H) 80.0 - 100.0 fL   MCH 30.1 26.0 - 34.0 pg   MCHC 29.9 (L) 30.0 - 36.0 g/dL   RDW 14.5 11.5 - 15.5 %   Platelets 168 150 - 400 K/uL   nRBC 0.0 0.0 - 0.2 %    Comment: Performed at [facility], [address] [place], [place] [address]  Basic metabolic panel     Status: Abnormal   Collection Time: 03/31/19  2:17 AM  Result Value Ref Range   Sodium 146 (H) 135 - 145 mmol/L   Potassium 6.1 (H) 3.5 - 5.1 mmol/L    Comment: DELTA CHECK NOTED   Chloride 112 (H) 98 - 111 mmol/L   CO2 27 22 - 32 mmol/L   Glucose, Bld 318 (H) 70 - 99 mg/dL   BUN 35 (H) 8 - 23 mg/dL   Creatinine, Ser 0.81 0.44 - 1.00 mg/dL   Calcium 9.6 8.9 - 10.3 mg/dL   GFR calc non Af Amer >60 >60 mL/min   GFR calc Af Amer >60 >60 mL/min   Anion gap 7 5 - 15    Comment: Performed at [facility], [address], [place], [place] [address]  Heparin level (unfractionated)     Status: Abnormal   Collection Time: 03/31/19  2:17 AM  Result Value Ref Range   Heparin Unfractionated 0.22 (L) 0.30 - 0.70 IU/mL    Comment: (NOTE) If heparin results are below expected values, and patient dosage has  been confirmed, suggest follow up testing of antithrombin III levels. Performed at [facility], [address] [place],  [address]   Blood gas, arterial     Status: Abnormal   Collection Time: 03/31/19  3:36 AM  Result Value Ref Range   FIO2 90.00    pH, Arterial 7.333 (L) 7.350 - 7.450   pCO2 arterial 59.7 (H) 32.0 - 48.0 mmHg   pO2, Arterial 58.9 (L) 83.0 - 108.0 mmHg   Bicarbonate 27.7 20.0 - 28.0 mmol/L   Acid-Base Excess 5.2 (H) 0.0 - 2.0 mmol/L   O2 Saturation 86.4 %   Patient temperature 37.0    Allens test (pass/fail) PASS PASS    Comment: Performed at [facility], [address] [place], [place] [address]  Glucose, capillary     Status: Abnormal   Collection Time: 03/31/19  4:19 AM  Result Value Ref Range   Glucose-Capillary 103 (H) 70 - 99 mg/dL  Glucose,  capillary      Status: None   Collection Time: 03/31/19  8:03 AM  Result Value Ref Range   Glucose-Capillary 93 70 - 99 mg/dL  Heparin level (unfractionated)     Status: Abnormal   Collection Time: 03/31/19 10:22 AM  Result Value Ref Range   Heparin Unfractionated 0.22 (L) 0.30 - 0.70 IU/mL    Comment: (NOTE) If heparin results are below expected values, and patient dosage has  been confirmed, suggest follow up testing of antithrombin III levels. Performed at [facility], [address], [place], [place] [address]   Glucose, capillary     Status: Abnormal   Collection Time: 03/31/19 11:49 AM  Result Value Ref Range   Glucose-Capillary 114 (H) 70 - 99 mg/dL  Magnesium     Status: None   Collection Time: 03/31/19 12:45 PM  Result Value Ref Range   Magnesium 2.1 1.7 - 2.4 mg/dL    Comment: Performed at [facility], [address] [place], [place] [address]  Phosphorus     Status: None   Collection Time: 03/31/19 12:45 PM  Result Value Ref Range   Phosphorus 2.7 2.5 - 4.6 mg/dL    Comment: Performed at [facility], [address], [place], [place] [address]  Glucose, capillary     Status: Abnormal   Collection Time: 03/31/19  4:35 PM  Result Value Ref Range   Glucose-Capillary 133 (H) 70 - 99 mg/dL  Magnesium     Status: None   Collection Time: 03/31/19  7:19 PM  Result Value Ref Range   Magnesium 2.1 1.7 - 2.4 mg/dL    Comment: Performed at [facility], [address] [place], [place] [address]  Phosphorus     Status: None   Collection Time: 03/31/19  7:19 PM  Result Value Ref Range   Phosphorus 3.0 2.5 - 4.6 mg/dL    Comment: Performed at [facility], [address], [place], [place] [address]  Heparin level (unfractionated)     Status: Abnormal   Collection Time: 03/31/19  7:19 PM  Result Value Ref Range   Heparin Unfractionated <0.10 (L) 0.30 - 0.70 IU/mL    Comment: (NOTE) If heparin results are below expected values, and patient dosage has  been confirmed, suggest follow up  testing of antithrombin III levels. Performed at [facility], [address], [place], [place] [address]   Glucose, capillary     Status: Abnormal   Collection Time: 03/31/19  7:44 PM  Result Value Ref Range   Glucose-Capillary 135 (H) 70 - 99 mg/dL  Glucose, capillary     Status: Abnormal   Collection Time: 04/01/19 12:19 AM  Result Value Ref Range   Glucose-Capillary 149 (H) 70 - 99 mg/dL  Blood gas, arterial     Status: Abnormal   Collection Time: 04/01/19  3:50 AM  Result Value Ref Range   FIO2 90.00    pH, Arterial 7.364 7.350 - 7.450   pCO2 arterial 53.5 (H) 32.0 - 48.0 mmHg   pO2, Arterial 99.5 83.0 - 108.0 mmHg   Bicarbonate 27.7 20.0 - 28.0 mmol/L   Acid-Base Excess 4.7 (H) 0.0 - 2.0 mmol/L   O2 Saturation 97.0 %   Patient temperature 37.0    Allens test (pass/fail) PASS PASS    Comment: Performed at [facility], [address], [place], [place] [address]  CBC     Status: Abnormal   Collection Time: 04/01/19  4:50 AM  Result Value Ref Range   WBC  18.0 (H) 4.0 - 10.5 K/uL   RBC 4.23 3.87 - 5.11 MIL/uL   Hemoglobin 13.0 12.0 - 15.0 g/dL   HCT 41.5 36.0 - 46.0 %   MCV 98.1 80.0 - 100.0 fL   MCH 30.7 26.0 - 34.0 pg   MCHC 31.3 30.0 - 36.0 g/dL   RDW 14.4 11.5 - 15.5 %   Platelets 220 150 - 400 K/uL   nRBC 0.0 0.0 - 0.2 %    Comment: Performed at [facility], [address], [place], [place] [address]  Basic metabolic panel     Status: Abnormal   Collection Time: 04/01/19  4:50 AM  Result Value Ref Range   Sodium 143 135 - 145 mmol/L   Potassium 4.4 3.5 - 5.1 mmol/L    Comment: DELTA CHECK NOTED   Chloride 107 98 - 111 mmol/L   CO2 30 22 - 32 mmol/L   Glucose, Bld 188 (H) 70 - 99 mg/dL   BUN 47 (H) 8 - 23 mg/dL   Creatinine, Ser 1.01 (H) 0.44 - 1.00 mg/dL   Calcium 9.9 8.9 - 10.3 mg/dL   GFR calc non Af Amer 50 (L) >60 mL/min   GFR calc Af Amer 58 (L) >60 mL/min   Anion gap 6 5 - 15    Comment: Performed at [facility], [address] [place], [place]  [address]  Magnesium     Status: None   Collection Time: 04/01/19  4:50 AM  Result Value Ref Range   Magnesium 2.2 1.7 - 2.4 mg/dL    Comment: Performed at [facility], [address] [place], [place] [address]  Phosphorus     Status: None   Collection Time: 04/01/19  4:50 AM  Result Value Ref Range   Phosphorus 2.9 2.5 - 4.6 mg/dL    Comment: Performed at [facility], [address] [place], [place] [address]  Heparin level (unfractionated)     Status: Abnormal   Collection Time: 04/01/19  4:50 AM  Result Value Ref Range   Heparin Unfractionated 0.14 (L) 0.30 - 0.70 IU/mL    Comment: (NOTE) If heparin results are below expected values, and patient dosage has  been confirmed, suggest follow up testing of antithrombin III levels. Performed at [facility], [address], [place], [place] [address]   Glucose, capillary     Status: Abnormal   Collection Time: 04/01/19  4:51 AM  Result Value Ref Range   Glucose-Capillary 155 (H) 70 - 99 mg/dL  Glucose, capillary     Status: Abnormal   Collection Time: 04/01/19  7:44 AM  Result Value Ref Range   Glucose-Capillary 152 (H) 70 - 99 mg/dL    ABGS Recent Labs    04/01/19 0350  PHART 7.364  PO2ART 99.5  HCO3 27.7   CULTURES Recent Results (from the past 240 hour(s))  SARS Coronavirus 2 (CEPHEID - Performed in [place] hospital lab), Hosp Order     Status: None   Collection Time: 04/17/2019  2:26 PM   Specimen: Nasopharyngeal Swab  Result Value Ref Range Status   SARS Coronavirus 2 NEGATIVE NEGATIVE Final    Comment: (NOTE) If result is NEGATIVE SARS-CoV-2 target nucleic acids are NOT DETECTED. The SARS-CoV-2 RNA is generally detectable in upper and lower  respiratory specimens during the acute phase of infection. The lowest  concentration of SARS-CoV-2 viral copies this assay can detect is 250  copies / mL. A negative result does not preclude SARS-CoV-2 infection  and should  not be used as the sole basis for treatment or other   patient management decisions.  A negative result may occur with  improper specimen collection / handling, submission of specimen other  than nasopharyngeal swab, presence of viral mutation(s) within the  areas targeted by this assay, and inadequate number of viral copies  (<250 copies / mL). A negative result must be combined with clinical  observations, patient history, and epidemiological information. If result is POSITIVE SARS-CoV-2 target nucleic acids are DETECTED. The SARS-CoV-2 RNA is generally detectable in upper and lower  respiratory specimens dur ing the acute phase of infection.  Positive  results are indicative of active infection with SARS-CoV-2.  Clinical  correlation with patient history and other diagnostic information is  necessary to determine patient infection status.  Positive results do  not rule out bacterial infection or co-infection with other viruses. If result is PRESUMPTIVE POSTIVE SARS-CoV-2 nucleic acids MAY BE PRESENT.   A presumptive positive result was obtained on the submitted specimen  and confirmed on repeat testing.  While 2019 novel coronavirus  (SARS-CoV-2) nucleic acids may be present in the submitted sample  additional confirmatory testing may be necessary for epidemiological  and / or clinical management purposes  to differentiate between  SARS-CoV-2 and other Sarbecovirus currently known to infect humans.  If clinically indicated additional testing with an alternate test  methodology [phone]) is advised. The SARS-CoV-2 RNA is generally  detectable in upper and lower respiratory sp ecimens during the acute  phase of infection. The expected result is Negative. Fact Sheet for Patients:  StrictlyIdeas.no Fact Sheet for Healthcare Providers: BankingDealers.co.za This test is not yet approved or cleared by the [place] FDA and has been authorized for detection and/or diagnosis of SARS-CoV-2  by FDA under an Emergency Use Authorization (EUA).  This EUA will remain in effect (meaning this test can be used) for the duration of the COVID-19 declaration under Section 564(b)(1) of the Act, 21 U.S.C. section 360bbb-3(b)(1), unless the authorization is terminated or revoked sooner. Performed at [facility], [address] [place], [place] [address]   MRSA PCR Screening     Status: None   Collection Time: 03/29/2019  7:54 PM   Specimen: Nasal Mucosa; Nasopharyngeal  Result Value Ref Range Status   MRSA by PCR NEGATIVE NEGATIVE Final    Comment:        The GeneXpert MRSA Assay (FDA approved for NASAL specimens only), is one component of a comprehensive MRSA colonization surveillance program. It is not intended to diagnose MRSA infection nor to guide or monitor treatment for MRSA infections. Performed at [facility], [address] [place], [place] [address]   Culture, blood (Routine X 2) w Reflex to ID Panel     Status: None   Collection Time: 03/29/2019  9:50 PM   Specimen: Left Antecubital; Blood  Result Value Ref Range Status   Specimen Description LEFT ANTECUBITAL  Final   Special Requests   Final    BOTTLES DRAWN AEROBIC AND ANAEROBIC Blood Culture adequate volume   Culture   Final    NO GROWTH 5 DAYS Performed at [facility], [address] [place], [place] [address]    Report Status 03/30/2019 FINAL  Final   Studies/Results: Dg Chest Port 1 View  Result Date: 03/31/2019 CLINICAL DATA:  Respiratory failure. EXAM: PORTABLE CHEST 1 VIEW COMPARISON:  03/30/2019 FINDINGS: The patient is rotated to the left, and the patient's chin partially obscures the left lung apex. An endotracheal  tube remains in place terminating approximately 1.5 cm of above the carina, not significantly changed. An enteric tube courses into the abdomen with tip not imaged. A left PICC terminates over the right atrium. There is persistent extensive airspace opacity throughout both lungs, stable to  minimally improved. IMPRESSION: Stable to minimally improved extensive bilateral airspace disease. Electronically Signed   By: Logan Bores M.D.   On: 03/31/2019 09:39   Dg Chest Port 1 View  Result Date: 03/30/2019 CLINICAL DATA:  Shortness of breath. EXAM: PORTABLE CHEST 1 VIEW COMPARISON:  03/27/2019 FINDINGS: The heart is enlarged but stable.  Left PICC line appears stable. Severe and progressive bilateral airspace process. Persistent small pleural effusions. No pneumothorax. IMPRESSION: Severe and progressive bilateral airspace process. Electronically Signed   By: Marijo Sanes M.D.   On: 03/30/2019 10:41   Dg Chest Port 1v Same Day  Result Date: 03/30/2019 CLINICAL DATA:  Status post intubation and OG tube placement today. EXAM: PORTABLE CHEST 1 VIEW COMPARISON:  Single-view of the chest earlier today. FINDINGS: Endotracheal tube tip is 1.5 cm above the carina. The tube could be withdrawn 1-2 cm for better positioning. OG tube courses into the stomach and below the inferior margin the film. Severe and diffuse bilateral airspace disease is again seen. Cardiac silhouette is obscured. No pneumothorax is identified. IMPRESSION: ETT tip is 1.5 cm above the carina. The tube could be withdrawn 1-2 cm. OG tube courses into the stomach and below the inferior margin the film. No change in diffuse bilateral airspace disease. Electronically Signed   By: Inge Rise M.D.   On: 03/30/2019 11:53    Medications:  Prior to Admission:  Medications Prior to Admission  Medication Sig Dispense Refill Last Dose  . apixaban (ELIQUIS) 2.5 MG TABS tablet Take 2.5 mg by mouth 2 (two) times daily.    03/24/2019 at Unknown time  . aspirin 81 MG tablet Take 81 mg by mouth daily.   unknown  . Calcium Carbonate-Vitamin D (CALCIUM + D PO) Take 1 tablet by mouth daily.    unknown  . digoxin (LANOXIN) 0.125 MG tablet Take 0.125 mg by mouth daily.   03/24/2019 at Unknown time  . furosemide (LASIX) 20 MG tablet Take 20 mg by  mouth See admin instructions. Take one tablet by mouth daily 5 days per week   03/23/2019 at Unknown time  . losartan (COZAAR) 50 MG tablet Take 50 mg by mouth daily.    03/24/2019 at Unknown time  . metoprolol tartrate (LOPRESSOR) 100 MG tablet Take 100 mg by mouth 2 (two) times daily.    03/24/2019 at Unknown time  . Multiple Vitamin (MULTIVITAMIN) tablet Take 1 tablet by mouth daily.   03/24/2019  . ondansetron (ZOFRAN) 4 MG tablet Take 4 mg by mouth daily as needed for nausea or vomiting.   03/24/2019 at Unknown time  . potassium chloride (K-DUR) 10 MEQ tablet Take 10 mEq by mouth See admin instructions. Take one tablet daily by mouth 5 days per week   03/23/2019 at Unknown time  . Cholecalciferol (VITAMIN D PO) Take 1 capsule by mouth daily.    03/24/2019   Scheduled: . chlorhexidine gluconate (MEDLINE KIT)  15 mL Mouth Rinse BID  . Chlorhexidine Gluconate Cloth  6 each Topical Daily  . feeding supplement (VITAL HIGH PROTEIN)  1,000 mL Per Tube Q24H  . furosemide  40 mg Intravenous BID  . mouth rinse  15 mL Mouth Rinse 10 times per day  . methylPREDNISolone (  SOLU-MEDROL) injection  40 mg Intravenous Q12H  . metoprolol tartrate  5 mg Intravenous Q8H  . pantoprazole (PROTONIX) IV  40 mg Intravenous Q24H  . sodium chloride flush  10-40 mL Intracatheter Q12H  . sodium chloride flush  3 mL Intravenous Q12H   Continuous: . sodium chloride 10 mL/hr at 03/31/19 0600  . amiodarone 30 mg/hr (04/01/19 0801)  . fentaNYL infusion INTRAVENOUS 25 mcg/hr (04/01/19 0801)  . heparin 1,300 Units/hr (04/01/19 0801)  . meropenem [facility]) IV Stopped (03/31/19 2046)   WMG:EEATVV chloride, acetaminophen **OR** acetaminophen, albuterol, bisacodyl, fentaNYL, midazolam, midazolam, ondansetron **OR** ondansetron (ZOFRAN) IV, phenol, polyethylene glycol, sodium chloride flush, sodium chloride flush, traZODone  Assesment: She was admitted with small bowel obstruction and aspiration pneumonia.  She has been improving and then  had deterioration of oxygenation had very diffuse bilateral infiltrates questionably aspirated again had ARDS requiring intubation and mechanical ventilation.  She has remained on the ventilator and oxygenation is now starting to improve.  Chest x-ray which I have personally reviewed looks better.  She has atrial fib with rapid ventricular response but she is responding to amiodarone  She had small bowel obstruction that seems to have resolved and she is tolerating tube feedings so far  She had acute diastolic heart failure her INO was approximately even yesterday.  She is still receiving Lasix Principal Problem:   SBO (small bowel obstruction) (HCC) Active Problems:   Essential hypertension   Chronic a-fib   PNA (pneumonia)--Aspiration   Respiratory failure (HCC)   Acute diastolic CHF (congestive heart failure) (HCC)   Atrial fibrillation with RVR (HCC)   Elevated troponin   Lobar pneumonia (HCC)   Coronary artery disease due to lipid rich plaque    Plan: Continue current treatments.  No opportunity for extubation now but will continue to try to wean oxygen and then start working on PEEP    LOS: 7 days   Alonza Bogus 04/01/2019, 8:05 AM

## 2019-04-01 NOTE — Progress Notes (Signed)
ANTICOAGULATION CONSULT NOTE - Follow Up Consult  Pharmacy Consult for heparin Indication: atrial fibrillation  Labs: Recent Labs    03/30/19 0444  03/31/19 0217  03/31/19 1919 04/01/19 0450 04/01/19 1350  HGB 12.0  --  11.0*  --   --  13.0  --   HCT 40.0  --  36.8  --   --  41.5  --   PLT 224  --  168  --   --  220  --   HEPARINUNFRC <0.10*   < > 0.22*   < > <0.10* 0.14* 0.53  CREATININE  --   --  0.81  --   --  1.01*  --    < > = values in this interval not displayed.    Assessment: 85yo female remains subtherapeutic on heparin after rate changes though now closer to goal; no gtt issues or signs of bleeding per RN. 6/13PM HL <0.10, called RN, unfortunately this heparin gtt was hanging but was not connected to any port on the patient. This apparently was the case when night RN came on at 1900. Since it is unknown if the previous 0.22 level was accurate, it is best to restart at this previous rate and check in 8 hours.   6/14AM HL 0.14 @ 1000 units/hr, subtherapeutic    6/14PM HL 0.53 @ 1500 units/hr, therapeutic    Goal of Therapy:  Heparin level 0.3-0.7 units/ml   Plan:  Continue heparin gtt @ 1300 units/hr and check level in am.      Thomasenia Sales, PharmD, MBA, BCGP Clinical Pharmacist  04/01/2019,2:41 PM

## 2019-04-01 NOTE — Progress Notes (Signed)
ANTICOAGULATION CONSULT NOTE - Follow Up Consult  Pharmacy Consult for heparin Indication: atrial fibrillation  Labs: Recent Labs    03/30/19 0444  03/31/19 0217 03/31/19 1022 03/31/19 1919 04/01/19 0450  HGB 12.0  --  11.0*  --   --  13.0  HCT 40.0  --  36.8  --   --  41.5  PLT 224  --  168  --   --  220  HEPARINUNFRC <0.10*   < > 0.22* 0.22* <0.10* 0.14*  CREATININE  --   --  0.81  --   --  1.01*   < > = values in this interval not displayed.    Assessment: 85yo female remains subtherapeutic on heparin after rate changes though now closer to goal; no gtt issues or signs of bleeding per RN. 6/13PM HL <0.10, called RN, unfortunately this heparin gtt was hanging but was not connected to any port on the patient. This apparently was the case when night RN came on at 1900. Since it is unknown if the previous 0.22 level was accurate, it is best to restart at this previous rate and check in 8 hours.   6/14AM HL 0.14 @ 1000 units/hr, subtherapeutic    Goal of Therapy:  Heparin level 0.3-0.7 units/ml   Plan:  Heparin dosing weight 47kg, bolus 30 units/kg, bolus heparin 1500 units  Will increase heparin gtt to 1300 units/hr and check level in 8 hours.    Thomasenia Sales, PharmD, MBA, BCGP Clinical Pharmacist  04/01/2019,7:19 AM

## 2019-04-01 NOTE — Progress Notes (Signed)
Gastric residual approximately 11mL. No s/s of distention, aspiration, or complications with tube feed. Will continue to monitor.Tube feed increased from 30 to 3mL per hour as ordered. PT is tolerating tube feed well.

## 2019-04-02 ENCOUNTER — Inpatient Hospital Stay (HOSPITAL_COMMUNITY): Payer: Medicare Other

## 2019-04-02 LAB — GLUCOSE, CAPILLARY
Glucose-Capillary: 148 mg/dL — ABNORMAL HIGH (ref 70–99)
Glucose-Capillary: 156 mg/dL — ABNORMAL HIGH (ref 70–99)
Glucose-Capillary: 150 mg/dL — ABNORMAL HIGH (ref 70–99)
Glucose-Capillary: 140 mg/dL — ABNORMAL HIGH (ref 70–99)
Glucose-Capillary: 160 mg/dL — ABNORMAL HIGH (ref 70–99)
Glucose-Capillary: 160 mg/dL — ABNORMAL HIGH (ref 70–99)

## 2019-04-02 LAB — BASIC METABOLIC PANEL
Anion gap: 9 (ref 5–15)
BUN: 66 mg/dL — ABNORMAL HIGH (ref 8–23)
CO2: 31 mmol/L (ref 22–32)
Calcium: 10.1 mg/dL (ref 8.9–10.3)
Chloride: 108 mmol/L (ref 98–111)
Creatinine, Ser: 1.06 mg/dL — ABNORMAL HIGH (ref 0.44–1.00)
GFR calc Af Amer: 54 mL/min — ABNORMAL LOW (ref >60)
GFR calc non Af Amer: 47 mL/min — ABNORMAL LOW (ref >60)
Glucose, Bld: 164 mg/dL — ABNORMAL HIGH (ref 70–99)
Potassium: 3.7 mmol/L (ref 3.5–5.1)
Sodium: 148 mmol/L — ABNORMAL HIGH (ref 135–145)

## 2019-04-02 LAB — BLOOD GAS, ARTERIAL
Acid-Base Excess: 5.5 mmol/L — ABNORMAL HIGH (ref 0.0–2.0)
Bicarbonate: 28.2 mmol/L — ABNORMAL HIGH (ref 20.0–28.0)
FIO2: 70
O2 Saturation: 91.5 %
Patient temperature: 37
pCO2 arterial: 56.7 mmHg — ABNORMAL HIGH (ref 32.0–48.0)
pH, Arterial: 7.355 (ref 7.350–7.450)
pO2, Arterial: 67.1 mmHg — ABNORMAL LOW (ref 83.0–108.0)

## 2019-04-02 LAB — HEPARIN LEVEL (UNFRACTIONATED): Heparin Unfractionated: 0.67 IU/mL (ref 0.30–0.70)

## 2019-04-02 MED ORDER — FUROSEMIDE 10 MG/ML IJ SOLN
20.0000 mg | Freq: Once | INTRAMUSCULAR | Status: AC
  Administered 2019-04-02: 20 mg via INTRAVENOUS
  Filled 2019-04-02: qty 2

## 2019-04-02 MED ORDER — FUROSEMIDE 10 MG/ML IJ SOLN
60.0000 mg | Freq: Two times a day (BID) | INTRAMUSCULAR | Status: DC
  Administered 2019-04-02 – 2019-04-04 (×4): 60 mg via INTRAVENOUS
  Filled 2019-04-02 (×4): qty 6

## 2019-04-02 MED ORDER — INSULIN ASPART 100 UNIT/ML ~~LOC~~ SOLN
5.0000 [IU] | SUBCUTANEOUS | Status: DC
  Administered 2019-04-02 – 2019-04-09 (×31): 5 [IU] via SUBCUTANEOUS

## 2019-04-02 MED FILL — Medication: Qty: 1 | Status: AC

## 2019-04-02 NOTE — Progress Notes (Signed)
[facility] Progress Note     Subjective: Doing well. Tolerated Tf at goal for 24 hrs and having Bms.   Objective: Vital signs in last 24 hours: Temp:  [96.4 F (35.8 C)-98.6 F (37 C)] 96.4 F (35.8 C) (06/15 0745) Pulse Rate:  [92-120] 92 (06/15 1000) Resp:  [17-33] 24 (06/15 1018) BP: (98-161)/(52-112) 123/68 (06/15 1000) SpO2:  [92 %-100 %] 97 % (06/15 1018) FiO2 (%):  [70 %-75 %] 70 % (06/15 0925) Weight:  [53.4 kg] 53.4 kg (06/15 0500) Last BM Date: 04/02/19  Intake/Output from previous day: 06/14 0701 - 06/15 0700 In: 1440.4 [I.V.:760.4; NG/GT:480; IV Piggyback:200] Out: 1350 [Urine:1350] Intake/Output this shift: No intake/output data recorded.  General appearance: alert, cooperative and no distress Resp: intubated, normal work of breathing GI: soft, minimally distended, nontender  Lab Results:  Recent Labs    03/31/19 0217 04/01/19 0450  WBC 14.9* 18.0*  HGB 11.0* 13.0  HCT 36.8 41.5  PLT 168 220   BMET Recent Labs    04/01/19 0450 04/02/19 0414  NA 143 148*  K 4.4 3.7  CL 107 108  CO2 30 31  GLUCOSE 188* 164*  BUN 47* 66*  CREATININE 1.01* 1.06*  CALCIUM 9.9 10.1   PT/INR No results for input(s): LABPROT, INR in the last 72 hours.  Studies/Results: Dg Chest Port 1 View  Result Date: 04/02/2019 CLINICAL DATA:  Respiratory failure. EXAM: PORTABLE CHEST 1 VIEW COMPARISON:  04/01/2019. FINDINGS: Endotracheal tube, left PICC line in stable position. Cardiomegaly unchanged. Diffuse bilateral pulmonary infiltrates/edema and bilateral pleural effusions again noted, no change. Persistent basilar atelectasis. No pneumothorax. IMPRESSION: 1.  Left PICC line noted with tip over superior vena cava. 2. Cardiomegaly unchanged. Diffuse bilateral pulmonary infiltrates/edema bilateral pleural effusions again noted, no change. Persistent basilar atelectasis. Electronically Signed   By: Maisie Fushomas  Register   On: 04/02/2019 07:52   Dg Chest Port 1  View  Result Date: 04/01/2019 CLINICAL DATA:  Acute respiratory failure. Aspiration pneumonia. EXAM: PORTABLE CHEST 1 VIEW COMPARISON:  03/31/2019 FINDINGS: Support lines and tubes in appropriate position. Stable cardiomegaly. Diffuse bilateral airspace disease shows no significant change. Probable small bilateral pleural effusions, also stable. IMPRESSION: No significant change in diffuse bilateral airspace disease and probable bilateral pleural effusions. Stable cardiomegaly. Electronically Signed   By: Myles RosenthalJohn  Stahl M.D.   On: 04/01/2019 08:12    Anti-infectives: Anti-infectives (From admission, onward)   Start     Dose/Rate Route Frequency Ordered Stop   03/27/19 1045  azithromycin (ZITHROMAX) 500 mg in sodium chloride 0.9 % 250 mL IVPB  Status:  Discontinued     500 mg 250 mL/hr over 60 Minutes Intravenous Every 24 hours 03/27/19 1038 03/30/19 1236   03/26/19 0900  meropenem (MERREM) 1,000 mg in sodium chloride 0.9 % 100 mL IVPB     1,000 mg 200 mL/hr over 30 Minutes Intravenous Every 12 hours 03/26/19 0814     04/15/2019 2200  cefTRIAXone (ROCEPHIN) 2 g in sodium chloride 0.9 % 100 mL IVPB  Status:  Discontinued     2 g 200 mL/hr over 30 Minutes Intravenous Every 24 hours 04/05/2019 2044 03/26/19 0814   04/13/2019 2100  metroNIDAZOLE (FLAGYL) IVPB 500 mg  Status:  Discontinued     500 mg 100 mL/hr over 60 Minutes Intravenous Every 8 hours 04/06/2019 2044 03/26/19 [id number]      Assessment/Plan: Carrie Morales is an 85 yo with resolved SBO. Doing well on TF.  -Continue TF at  Goal -Will  sign off, notify if any other issues arise   Discussed with Dr. Dyann Kief.    LOS: 8 days    Virl Cagey 04/02/2019

## 2019-04-02 NOTE — Progress Notes (Signed)
Subjective: We have been able to continue weaning oxygen and she is down to 70% now with PO2 of 67.  She does have elevated PCO2 and I think that is mostly from the mode of ventilation with ARDS protocol.  Chest x-ray that I have personally reviewed today looks much better and I think we can go ahead and switch her back to standard mode of ventilation.  She is awake and alert has no complaints.  Objective: Vital signs in last 24 hours: Temp:  [96.4 F (35.8 C)-98.6 F (37 C)] 96.4 F (35.8 C) (06/15 0745) Pulse Rate:  [110-120] 120 (06/14 1830) Resp:  [17-33] 25 (06/15 0745) BP: (98-161)/(52-112) 121/66 (06/15 0630) SpO2:  [92 %-99 %] 97 % (06/15 0745) FiO2 (%):  [70 %-75 %] 70 % (06/15 0740) Weight:  [53.4 kg] 53.4 kg (06/15 0500) Weight change: 2.1 kg Last BM Date: 04/01/19  Intake/Output from previous day: 06/14 0701 - 06/15 0700 In: 1440.4 [I.V.:760.4; NG/GT:480; IV Piggyback:200] Out: 1350 [Urine:1350]  PHYSICAL EXAM General appearance: Intubated on sedation but awake.  Able to respond to simple questions. Resp: She still has bilateral rales and rhonchi but her chest is much clearer Cardio: Heart is irregular with rate around 100-1 10 GI: soft, non-tender; bowel sounds normal; no masses,  no organomegaly Extremities: extremities normal, atraumatic, no cyanosis or edema  Lab Results:  Results for orders placed or performed during the hospital encounter of 04/14/2019 (from the past 48 hour(s))  Heparin level (unfractionated)     Status: Abnormal   Collection Time: 03/31/19 10:22 AM  Result Value Ref Range   Heparin Unfractionated 0.22 (L) 0.30 - 0.70 IU/mL    Comment: (NOTE) If heparin results are below expected values, and patient dosage has  been confirmed, suggest follow up testing of antithrombin III levels. Performed at [facility], [address], [place], [place] [address]   Glucose, capillary     Status: Abnormal   Collection Time: 03/31/19 11:49 AM  Result Value  Ref Range   Glucose-Capillary 114 (H) 70 - 99 mg/dL  Magnesium     Status: None   Collection Time: 03/31/19 12:45 PM  Result Value Ref Range   Magnesium 2.1 1.7 - 2.4 mg/dL    Comment: Performed at [facility], [address] [place], [place] [address]  Phosphorus     Status: None   Collection Time: 03/31/19 12:45 PM  Result Value Ref Range   Phosphorus 2.7 2.5 - 4.6 mg/dL    Comment: Performed at [facility], [address], [place], [place] [address]  Glucose, capillary     Status: Abnormal   Collection Time: 03/31/19  4:35 PM  Result Value Ref Range   Glucose-Capillary 133 (H) 70 - 99 mg/dL  Magnesium     Status: None   Collection Time: 03/31/19  7:19 PM  Result Value Ref Range   Magnesium 2.1 1.7 - 2.4 mg/dL    Comment: Performed at [facility], [address] [place], [place] [address]  Phosphorus     Status: None   Collection Time: 03/31/19  7:19 PM  Result Value Ref Range   Phosphorus 3.0 2.5 - 4.6 mg/dL    Comment: Performed at [facility], [address] [place], [place] [address]  Heparin level (unfractionated)     Status: Abnormal   Collection Time: 03/31/19  7:19 PM  Result Value Ref Range   Heparin Unfractionated <0.10 (L) 0.30 - 0.70 IU/mL    Comment: (NOTE) If heparin results  are below expected values, and patient dosage has  been confirmed, suggest follow up testing of antithrombin III levels. Performed at [facility], [address] [place], [place] [address]   Glucose, capillary     Status: Abnormal   Collection Time: 03/31/19  7:44 PM  Result Value Ref Range   Glucose-Capillary 135 (H) 70 - 99 mg/dL  Glucose, capillary     Status: Abnormal   Collection Time: 04/01/19 12:19 AM  Result Value Ref Range   Glucose-Capillary 149 (H) 70 - 99 mg/dL  Blood gas, arterial     Status: Abnormal   Collection Time: 04/01/19  3:50 AM  Result Value Ref Range   FIO2 90.00    pH, Arterial 7.364 7.350 - 7.450   pCO2 arterial 53.5 (H) 32.0 - 48.0 mmHg   pO2,  Arterial 99.5 83.0 - 108.0 mmHg   Bicarbonate 27.7 20.0 - 28.0 mmol/L   Acid-Base Excess 4.7 (H) 0.0 - 2.0 mmol/L   O2 Saturation 97.0 %   Patient temperature 37.0    Allens test (pass/fail) PASS PASS    Comment: Performed at [facility], [address] [place], [place] [address]  CBC     Status: Abnormal   Collection Time: 04/01/19  4:50 AM  Result Value Ref Range   WBC 18.0 (H) 4.0 - 10.5 K/uL   RBC 4.23 3.87 - 5.11 MIL/uL   Hemoglobin 13.0 12.0 - 15.0 g/dL   HCT 41.5 36.0 - 46.0 %   MCV 98.1 80.0 - 100.0 fL   MCH 30.7 26.0 - 34.0 pg   MCHC 31.3 30.0 - 36.0 g/dL   RDW 14.4 11.5 - 15.5 %   Platelets 220 150 - 400 K/uL   nRBC 0.0 0.0 - 0.2 %    Comment: Performed at [facility], [address] [place], [place] [address]  Basic metabolic panel     Status: Abnormal   Collection Time: 04/01/19  4:50 AM  Result Value Ref Range   Sodium 143 135 - 145 mmol/L   Potassium 4.4 3.5 - 5.1 mmol/L    Comment: DELTA CHECK NOTED   Chloride 107 98 - 111 mmol/L   CO2 30 22 - 32 mmol/L   Glucose, Bld 188 (H) 70 - 99 mg/dL   BUN 47 (H) 8 - 23 mg/dL   Creatinine, Ser 1.01 (H) 0.44 - 1.00 mg/dL   Calcium 9.9 8.9 - 10.3 mg/dL   GFR calc non Af Amer 50 (L) >60 mL/min   GFR calc Af Amer 58 (L) >60 mL/min   Anion gap 6 5 - 15    Comment: Performed at [facility], [address], [place], [place] [address]  Magnesium     Status: None   Collection Time: 04/01/19  4:50 AM  Result Value Ref Range   Magnesium 2.2 1.7 - 2.4 mg/dL    Comment: Performed at [facility], [address] [place], [place] [address]  Phosphorus     Status: None   Collection Time: 04/01/19  4:50 AM  Result Value Ref Range   Phosphorus 2.9 2.5 - 4.6 mg/dL    Comment: Performed at [facility], [address], [place], [place] [address]  Heparin level (unfractionated)     Status: Abnormal   Collection Time: 04/01/19  4:50 AM  Result Value Ref Range   Heparin Unfractionated 0.14 (L) 0.30 - 0.70 IU/mL    Comment: (NOTE) If  heparin results are below expected values, and patient dosage has  been  confirmed, suggest follow up testing of antithrombin III levels. Performed at [facility], [address], [place], [place] [address]   Glucose, capillary     Status: Abnormal   Collection Time: 04/01/19  4:51 AM  Result Value Ref Range   Glucose-Capillary 155 (H) 70 - 99 mg/dL  Glucose, capillary     Status: Abnormal   Collection Time: 04/01/19  7:44 AM  Result Value Ref Range   Glucose-Capillary 152 (H) 70 - 99 mg/dL  Glucose, capillary     Status: Abnormal   Collection Time: 04/01/19 11:35 AM  Result Value Ref Range   Glucose-Capillary 175 (H) 70 - 99 mg/dL  Heparin level (unfractionated)     Status: None   Collection Time: 04/01/19  1:50 PM  Result Value Ref Range   Heparin Unfractionated 0.53 0.30 - 0.70 IU/mL    Comment: (NOTE) If heparin results are below expected values, and patient dosage has  been confirmed, suggest follow up testing of antithrombin III levels. Performed at [facility], [address] [place], [place] [address]   Glucose, capillary     Status: Abnormal   Collection Time: 04/01/19  4:09 PM  Result Value Ref Range   Glucose-Capillary 182 (H) 70 - 99 mg/dL  Magnesium     Status: None   Collection Time: 04/01/19  5:11 PM  Result Value Ref Range   Magnesium 2.1 1.7 - 2.4 mg/dL    Comment: Performed at [facility], [address] [place], [place] [address]  Phosphorus     Status: None   Collection Time: 04/01/19  5:11 PM  Result Value Ref Range   Phosphorus 2.9 2.5 - 4.6 mg/dL    Comment: Performed at [facility], [address] [place], [place] [address]  Glucose, capillary     Status: Abnormal   Collection Time: 04/01/19  7:28 PM  Result Value Ref Range   Glucose-Capillary 182 (H) 70 - 99 mg/dL  Glucose, capillary     Status: Abnormal   Collection Time: 04/01/19  9:32 PM  Result Value Ref Range   Glucose-Capillary 165 (H) 70 - 99 mg/dL  Glucose, capillary     Status:  Abnormal   Collection Time: 04/02/19  1:03 AM  Result Value Ref Range   Glucose-Capillary 148 (H) 70 - 99 mg/dL  Blood gas, arterial     Status: Abnormal   Collection Time: 04/02/19  4:00 AM  Result Value Ref Range   FIO2 70.00    pH, Arterial 7.355 7.350 - 7.450   pCO2 arterial 56.7 (H) 32.0 - 48.0 mmHg   pO2, Arterial 67.1 (L) 83.0 - 108.0 mmHg   Bicarbonate 28.2 (H) 20.0 - 28.0 mmol/L   Acid-Base Excess 5.5 (H) 0.0 - 2.0 mmol/L   O2 Saturation 91.5 %   Patient temperature 37.0    Allens test (pass/fail) PASS PASS    Comment: Performed at [facility], [address] [place], [place] [address]  Basic metabolic panel     Status: Abnormal   Collection Time: 04/02/19  4:14 AM  Result Value Ref Range   Sodium 148 (H) 135 - 145 mmol/L   Potassium 3.7 3.5 - 5.1 mmol/L   Chloride 108 98 - 111 mmol/L   CO2 31 22 - 32 mmol/L   Glucose, Bld 164 (H) 70 - 99 mg/dL   BUN 66 (H) 8 - 23 mg/dL   Creatinine, Ser 1.06 (H) 0.44 - 1.00 mg/dL   Calcium 10.1 8.9 - 10.3 mg/dL  GFR calc non Af Amer 47 (L) >60 mL/min   GFR calc Af Amer 54 (L) >60 mL/min   Anion gap 9 5 - 15    Comment: Performed at [facility], [address], [place], [place] [address]  Heparin level (unfractionated)     Status: None   Collection Time: 04/02/19  4:14 AM  Result Value Ref Range   Heparin Unfractionated 0.67 0.30 - 0.70 IU/mL    Comment: (NOTE) If heparin results are below expected values, and patient dosage has  been confirmed, suggest follow up testing of antithrombin III levels. Performed at [facility], [address], [place], [place] [address]   Glucose, capillary     Status: Abnormal   Collection Time: 04/02/19  5:26 AM  Result Value Ref Range   Glucose-Capillary 156 (H) 70 - 99 mg/dL  Glucose, capillary     Status: Abnormal   Collection Time: 04/02/19  7:46 AM  Result Value Ref Range   Glucose-Capillary 150 (H) 70 - 99 mg/dL    ABGS Recent Labs    04/02/19 0400  PHART 7.355  PO2ART 67.1*   HCO3 28.2*   CULTURES Recent Results (from the past 240 hour(s))  SARS Coronavirus 2 (CEPHEID - Performed in [place] hospital lab), Hosp Order     Status: None   Collection Time: 03/20/2019  2:26 PM   Specimen: Nasopharyngeal Swab  Result Value Ref Range Status   SARS Coronavirus 2 NEGATIVE NEGATIVE Final    Comment: (NOTE) If result is NEGATIVE SARS-CoV-2 target nucleic acids are NOT DETECTED. The SARS-CoV-2 RNA is generally detectable in upper and lower  respiratory specimens during the acute phase of infection. The lowest  concentration of SARS-CoV-2 viral copies this assay can detect is 250  copies / mL. A negative result does not preclude SARS-CoV-2 infection  and should not be used as the sole basis for treatment or other  patient management decisions.  A negative result may occur with  improper specimen collection / handling, submission of specimen other  than nasopharyngeal swab, presence of viral mutation(s) within the  areas targeted by this assay, and inadequate number of viral copies  (<250 copies / mL). A negative result must be combined with clinical  observations, patient history, and epidemiological information. If result is POSITIVE SARS-CoV-2 target nucleic acids are DETECTED. The SARS-CoV-2 RNA is generally detectable in upper and lower  respiratory specimens dur ing the acute phase of infection.  Positive  results are indicative of active infection with SARS-CoV-2.  Clinical  correlation with patient history and other diagnostic information is  necessary to determine patient infection status.  Positive results do  not rule out bacterial infection or co-infection with other viruses. If result is PRESUMPTIVE POSTIVE SARS-CoV-2 nucleic acids MAY BE PRESENT.   A presumptive positive result was obtained on the submitted specimen  and confirmed on repeat testing.  While 2019 novel coronavirus  (SARS-CoV-2) nucleic acids may be present in the submitted sample   additional confirmatory testing may be necessary for epidemiological  and / or clinical management purposes  to differentiate between  SARS-CoV-2 and other Sarbecovirus currently known to infect humans.  If clinically indicated additional testing with an alternate test  methodology [phone]) is advised. The SARS-CoV-2 RNA is generally  detectable in upper and lower respiratory sp ecimens during the acute  phase of infection. The expected result is Negative. Fact Sheet for Patients:  StrictlyIdeas.no Fact Sheet for Healthcare Providers: BankingDealers.co.za This test is not yet  approved or cleared by the [place] and has been authorized for detection and/or diagnosis of SARS-CoV-2 by FDA under an Emergency Use Authorization (EUA).  This EUA will remain in effect (meaning this test can be used) for the duration of the COVID-19 declaration under Section 564(b)(1) of the Act, 21 U.S.C. section 360bbb-3(b)(1), unless the authorization is terminated or revoked sooner. Performed at [facility], [address] [place], [place] [address]   MRSA PCR Screening     Status: None   Collection Time: 04/04/2019  7:54 PM   Specimen: Nasal Mucosa; Nasopharyngeal  Result Value Ref Range Status   MRSA by PCR NEGATIVE NEGATIVE Final    Comment:        The GeneXpert MRSA Assay (FDA approved for NASAL specimens only), is one component of a comprehensive MRSA colonization surveillance program. It is not intended to diagnose MRSA infection nor to guide or monitor treatment for MRSA infections. Performed at [facility], [address] [place], [place] [address]   Culture, blood (Routine X 2) w Reflex to ID Panel     Status: None   Collection Time: 03/19/2019  9:50 PM   Specimen: Left Antecubital; Blood  Result Value Ref Range Status   Specimen Description LEFT ANTECUBITAL  Final   Special Requests   Final    BOTTLES DRAWN AEROBIC AND ANAEROBIC  Blood Culture adequate volume   Culture   Final    NO GROWTH 5 DAYS Performed at [facility], [address] [place], [place] [address]    Report Status 03/30/2019 FINAL  Final   Studies/Results: Dg Chest Port 1 View  Result Date: 04/02/2019 CLINICAL DATA:  Respiratory failure. EXAM: PORTABLE CHEST 1 VIEW COMPARISON:  04/01/2019. FINDINGS: Endotracheal tube, left PICC line in stable position. Cardiomegaly unchanged. Diffuse bilateral pulmonary infiltrates/edema and bilateral pleural effusions again noted, no change. Persistent basilar atelectasis. No pneumothorax. IMPRESSION: 1.  Left PICC line noted with tip over superior vena cava. 2. Cardiomegaly unchanged. Diffuse bilateral pulmonary infiltrates/edema bilateral pleural effusions again noted, no change. Persistent basilar atelectasis. Electronically Signed   By: Marcello Moores  Register   On: 04/02/2019 07:52   Dg Chest Port 1 View  Result Date: 04/01/2019 CLINICAL DATA:  Acute respiratory failure. Aspiration pneumonia. EXAM: PORTABLE CHEST 1 VIEW COMPARISON:  03/31/2019 FINDINGS: Support lines and tubes in appropriate position. Stable cardiomegaly. Diffuse bilateral airspace disease shows no significant change. Probable small bilateral pleural effusions, also stable. IMPRESSION: No significant change in diffuse bilateral airspace disease and probable bilateral pleural effusions. Stable cardiomegaly. Electronically Signed   By: Earle Gell M.D.   On: 04/01/2019 08:12    Medications:  Prior to Admission:  Medications Prior to Admission  Medication Sig Dispense Refill Last Dose  . apixaban (ELIQUIS) 2.5 MG TABS tablet Take 2.5 mg by mouth 2 (two) times daily.    03/24/2019 at Unknown time  . aspirin 81 MG tablet Take 81 mg by mouth daily.   unknown  . Calcium Carbonate-Vitamin D (CALCIUM + D PO) Take 1 tablet by mouth daily.    unknown  . digoxin (LANOXIN) 0.125 MG tablet Take 0.125 mg by mouth daily.   03/24/2019 at Unknown time  . furosemide (LASIX)  20 MG tablet Take 20 mg by mouth See admin instructions. Take one tablet by mouth daily 5 days per week   03/23/2019 at Unknown time  . losartan (COZAAR) 50 MG tablet Take 50 mg by mouth daily.    03/24/2019 at Unknown time  .  metoprolol tartrate (LOPRESSOR) 100 MG tablet Take 100 mg by mouth 2 (two) times daily.    03/24/2019 at Unknown time  . Multiple Vitamin (MULTIVITAMIN) tablet Take 1 tablet by mouth daily.   03/24/2019  . ondansetron (ZOFRAN) 4 MG tablet Take 4 mg by mouth daily as needed for nausea or vomiting.   03/24/2019 at Unknown time  . potassium chloride (K-DUR) 10 MEQ tablet Take 10 mEq by mouth See admin instructions. Take one tablet daily by mouth 5 days per week   03/23/2019 at Unknown time  . Cholecalciferol (VITAMIN D PO) Take 1 capsule by mouth daily.    03/24/2019   Scheduled: . chlorhexidine gluconate (MEDLINE KIT)  15 mL Mouth Rinse BID  . Chlorhexidine Gluconate Cloth  6 each Topical Daily  . feeding supplement (VITAL HIGH PROTEIN)  1,000 mL Per Tube Q24H  . furosemide  40 mg Intravenous BID  . insulin aspart  5 Units Subcutaneous Q4H  . mouth rinse  15 mL Mouth Rinse 10 times per day  . methylPREDNISolone (SOLU-MEDROL) injection  40 mg Intravenous Q12H  . metoprolol tartrate  5 mg Intravenous Q8H  . pantoprazole (PROTONIX) IV  40 mg Intravenous Q24H  . sodium chloride flush  10-40 mL Intracatheter Q12H  . sodium chloride flush  3 mL Intravenous Q12H   Continuous: . sodium chloride 10 mL/hr at 03/31/19 0600  . amiodarone 30 mg/hr (04/02/19 0400)  . fentaNYL infusion INTRAVENOUS 50 mcg/hr (04/02/19 0400)  . heparin 1,300 Units/hr (04/02/19 0400)  . meropenem (MERREM) IV Stopped (04/01/19 2200)   STM:HDQQIW chloride, acetaminophen **OR** acetaminophen, albuterol, bisacodyl, fentaNYL, midazolam, midazolam, ondansetron **OR** ondansetron (ZOFRAN) IV, phenol, polyethylene glycol, sodium chloride flush, sodium chloride flush, traZODone  Assesment: She was admitted with small bowel  obstruction and was improving when she developed increasing shortness of breath and hypoxia.  Chest x-ray showed diffuse bilateral infiltrates consistent with ARDS.  She may have aspirated again.  There is also question of volume overload.  At any rate chest x-ray is improving particularly from the very abnormal chest x-ray on the day she was intubated.  She was intubated and placed on mechanical ventilation and has remained on the ventilator since.  She has been ventilated with ARDS protocol but it looks to me like we can switch her now.  She has been on antibiotics and steroids  She has malnutrition and is on tube feedings  She has atrial fib we had problems with her blood pressure when she was on Cardizem so she was switched to amiodarone and has pretty good control of her heart rate  There was question of volume overload and she is about 7.5 L positive but she is basically even for the last 48 hours.  Her small bowel obstruction has resolved and she is able to tolerate tube feedings  Her blood sugar has gone up some since she has been on the tube feedings and Solu-Medrol.  She is being treated with insulin Principal Problem:   SBO (small bowel obstruction) (HCC) Active Problems:   Essential hypertension   Chronic a-fib   PNA (pneumonia)--Aspiration   Respiratory failure (HCC)   Acute diastolic CHF (congestive heart failure) (HCC)   Atrial fibrillation with RVR (HCC)   Elevated troponin   Lobar pneumonia (HCC)   Coronary artery disease due to lipid rich plaque    Plan: Continue treatments.  Switch to standard mode of ventilation.    LOS: 8 days   Alonza Bogus 04/02/2019, 8:04 AM

## 2019-04-02 NOTE — Progress Notes (Signed)
PROGRESS NOTE  Carrie Morales OEH:[id number] DOB: 1928/10/10 DOA: 03/19/2019 PCP: Earney Mallet, MD  Brief History:  85 year old female with a history of chronic atrial fibrillation on apixaban, diastolic CHF, hypertension, bowel obstruction status post bowel resection 30 years ago presenting with 1 week history of abdominal pain that worsened over the past 2 to 3 days to the point of resulting in nausea and vomiting.  The patient was also having constipation-like symptoms and tried some over-the-counter laxatives without relief.  Because of her progressive symptoms she presented for further evaluation.  CT of the abdomen and pelvis in the ED showed left basilar collapse less consolidative changes, small bilateral pleural effusions, left greater than right with a large hiatus hernia.  There was also dilatation of the proximal and mid small bowel loops up to 2.9 cm consistent with a high-grade SBO.  The patient was treated with bowel rest and IV fluids.  When she arrived to the medical floor, the patient developed atrial fibrillation with RVR and respiratory distress.  She was transferred to the stepdown unit and started on diltiazem drip and placed on BiPAP.  Subsequently, the patient became hypotensive requiring fluid resuscitation.  Repeat chest x-ray show worsening bilateral perihilar densities with stable bibasilar opacifications.  Lactic acid peaked at 4.4.  The patient was started on ceftriaxone and metronidazole.  Assessment/Plan: Sepsis -Present at the time of admission -Secondary to aspiration pneumonia/CAP -Lactic acid peaked at 4.4; now normal. -procalcitonin  Peaked at 5.86 -WBCs started to trend down; which is something that code changed now that the patient will be started on a steroids. -Follow blood culture--neg so far -Urinalysis negative -Continue current IV antibiotics and supportive care.  Acute respiratory failure with hypoxia hypercapnia -Secondary to  pneumonia, ARDS and acute on chronic heart failure. -Patient failed response of BiPAP and in the requiring intubation and  mechanical ventilation -Follow ARDS protocol -Appreciate pulmonology's assistance and recommendations -Continue current IV antibiotics; aiming for a total of 10 days. -Continue Solu-Medrol 40 mg every 12 hours as recommended by pulmonologist. -continue Diuresis as tolerated -ABG demonstrating improvement in PO2 and CO2 -Follow clinical response.  Small bowel obstruction -General surgery consult appreciated -6/15--case discussed with Dr. Constance Haw -Patient having bowel movements -SBO essentially resolved -Continue electrolytes monitoring and repletion as needed. -Continue Protonix -Continue tube feedings; so far well-tolerated and now at goal.  Patient having positive bowel movements.   -Repeated 2 views abdominal x-ray demonstrated improvement in the small bowel dilatation and not new focal abnormality.  Atrial fibrillation with RVR -Precipitated by her acute medical condition -Will continue metoprolol and amiodarone. -Heart rate is better controlled -Continue heparin drip -Overall with improved rate control. -Echocardiogram--EF >65%, no WMA, mod MR -Appreciate cardiology consult  Acute on chronic diastolic CHF -Continue daily weights -Follow strict I's and O's -Continue IV Lasix; but following cardiology recommendations and taking the patches of improvement in her blood pressure will increase to 60 mg every 12 hours and follow volume status closely. -Start checking CVP's.  -Good urine output appreciated.  Acute on chronic renal failure: Stage III at baseline.   -Secondary to hemodynamic changes and hypotension. -Baseline creatinine~0.8-1.1 -monitor with diuresis -Creatinine peak of 1.3; and then down to 1.1 -Will continue to monitor trend especially while receiving IV Lasix now.  Hyperglycemia -check A1C--5.3 -Elevated blood sugars in the setting  of stress demargination.   Disposition Plan:   Remain in ICU Family Communication:   Son updated at  bedside 6/13  Consultants:  General surgery; Cardiology; pulmonology  Code Status:  FULL  DVT Prophylaxis:   Continue IV Heparin   Procedures: As Listed in Progress Note Above  Antibiotics: Ceftriaxone/flagyl 6/7>>6/8 Merrem 6/8>>>  Patient remains critically ill with multiorgan system failure requiring high complexity decision making for assessment and support, frequent evaluation and titration of therapies, application of advance monitoring technologies and extensive interpretation of multiple databases.  Patient still has been updated at bedside.  Case discussed with cardiology and pulmonologist.  We will continue attempting decrease in her ventilatory support, continue IV diuresis, continue IV antibiotics and steroids; continue supportive care.  No significant residuals appreciated on tube feedings and good tolerance with positive bowel movements.  Patient's remains in ICU.    Time: 35 minutes.   Subjective: Afebrile.  Remains intubated and mechanically ventilated.  No nausea, no vomiting, positive bowel movement and demonstrating good tolerance of tube feedings.  No significant residual.  PEEP down to 5 and oxygen supplementation through ventilator 70%.    Objective: Vitals:   04/02/19 1200 04/02/19 1230 04/02/19 1330 04/02/19 1412  BP: 123/74 127/67 117/81   Pulse:   (!) 105   Resp: (!) 24 (!) 21 (!) 24   Temp:      TempSrc:      SpO2: 96% 98% 95% 95%  Weight:      Height:        Intake/Output Summary (Last 24 hours) at 04/02/2019 1418 Last data filed at 04/02/2019 1250 Gross per 24 hour  Intake 1537.11 ml  Output 1675 ml  Net -137.89 ml   Weight change: 2.1 kg  Exam: General exam: Alert, awake, oriented x 3; able to follow simple commands and communicate with hands.  Remains intubated and mechanically ventilated.  Afebrile.  Having bowel movement and  tolerating tube feedings. Respiratory system: Decreased breath sounds at the bases, positive rhonchi right, improved tachypnea.   Cardiovascular system: Irregular, improve rate control, positive systolic ejection murmur, no rubs, no gallops. Gastrointestinal system: Abdomen is nondistended, soft and nontender. No organomegaly or masses felt. Normal bowel sounds heard. Central nervous system: Alert and oriented. No focal neurological deficits. Extremities: No C/C/E, +pedal pulses Skin: No rashes, lesions or ulcers Psychiatry: Mood and affect appropriate.  Data Reviewed: I have personally reviewed following labs and imaging studies   Basic Metabolic Panel: Recent Labs  Lab 03/27/19 0612 03/28/19 0425 03/29/19 0453 03/31/19 0217 03/31/19 1245 03/31/19 1919 04/01/19 0450 04/01/19 1711 04/02/19 0414  NA 140 139 141 146*  --   --  143  --  148*  K 4.2 4.3 4.6 6.1*  --   --  4.4  --  3.7  CL 106 107 108 112*  --   --  107  --  108  CO2 '26 24 29 27  '$ --   --  30  --  31  GLUCOSE 89 132* 141* 318*  --   --  188*  --  164*  BUN 38* 44* 39* 35*  --   --  47*  --  66*  CREATININE 1.32* 1.14* 0.79 0.81  --   --  1.01*  --  1.06*  CALCIUM 9.2 9.4 9.8 9.6  --   --  9.9  --  10.1  MG 2.0  --  2.3  --  2.1 2.1 2.2 2.1  --   PHOS 3.1  --   --   --  2.7 3.0 2.9 2.9  --  Liver Function Tests: Recent Labs  Lab 03/27/19 0612 03/28/19 0425  AST 18 16  ALT 17 16  ALKPHOS 40 53  BILITOT 1.0 1.2  PROT 5.3* 5.2*  ALBUMIN 2.5* 2.4*   CBC: Recent Labs  Lab 03/28/19 0425 03/29/19 0453 03/30/19 0444 03/31/19 0217 04/01/19 0450  WBC 18.1* 18.0* 17.8* 14.9* 18.0*  HGB 11.3* 11.8* 12.0 11.0* 13.0  HCT 36.9 38.8 40.0 36.8 41.5  MCV 97.6 98.5 99.5 100.5* 98.1  PLT 191 223 224 168 220   CBG: Recent Labs  Lab 04/01/19 2132 04/02/19 0103 04/02/19 0526 04/02/19 0746 04/02/19 1145  GLUCAP 165* 148* 156* 150* 140*   Urine analysis:    Component Value Date/Time   COLORURINE AMBER (A)  04/15/2019 1158   APPEARANCEUR HAZY (A) 04/13/2019 1158   LABSPEC 1.027 04/01/2019 1158   PHURINE 5.0 04/05/2019 1158   GLUCOSEU NEGATIVE 04/16/2019 1158   HGBUR NEGATIVE 04/09/2019 1158   BILIRUBINUR NEGATIVE 03/21/2019 1158   KETONESUR 20 (A) 03/26/2019 1158   PROTEINUR 100 (A) 03/21/2019 1158   UROBILINOGEN 0.2 07/24/2014 1534   NITRITE NEGATIVE 04/03/2019 1158   LEUKOCYTESUR NEGATIVE 04/01/2019 1158    Recent Results (from the past 240 hour(s))  SARS Coronavirus 2 (CEPHEID - Performed in [place] hospital lab), Hosp Order     Status: None   Collection Time: 03/24/2019  2:26 PM   Specimen: Nasopharyngeal Swab  Result Value Ref Range Status   SARS Coronavirus 2 NEGATIVE NEGATIVE Final    Comment: (NOTE) If result is NEGATIVE SARS-CoV-2 target nucleic acids are NOT DETECTED. The SARS-CoV-2 RNA is generally detectable in upper and lower  respiratory specimens during the acute phase of infection. The lowest  concentration of SARS-CoV-2 viral copies this assay can detect is 250  copies / mL. A negative result does not preclude SARS-CoV-2 infection  and should not be used as the sole basis for treatment or other  patient management decisions.  A negative result may occur with  improper specimen collection / handling, submission of specimen other  than nasopharyngeal swab, presence of viral mutation(s) within the  areas targeted by this assay, and inadequate number of viral copies  (<250 copies / mL). A negative result must be combined with clinical  observations, patient history, and epidemiological information. If result is POSITIVE SARS-CoV-2 target nucleic acids are DETECTED. The SARS-CoV-2 RNA is generally detectable in upper and lower  respiratory specimens dur ing the acute phase of infection.  Positive  results are indicative of active infection with SARS-CoV-2.  Clinical  correlation with patient history and other diagnostic information is  necessary to determine  patient infection status.  Positive results do  not rule out bacterial infection or co-infection with other viruses. If result is PRESUMPTIVE POSTIVE SARS-CoV-2 nucleic acids MAY BE PRESENT.   A presumptive positive result was obtained on the submitted specimen  and confirmed on repeat testing.  While 2019 novel coronavirus  (SARS-CoV-2) nucleic acids may be present in the submitted sample  additional confirmatory testing may be necessary for epidemiological  and / or clinical management purposes  to differentiate between  SARS-CoV-2 and other Sarbecovirus currently known to infect humans.  If clinically indicated additional testing with an alternate test  methodology [phone]) is advised. The SARS-CoV-2 RNA is generally  detectable in upper and lower respiratory sp ecimens during the acute  phase of infection. The expected result is Negative. Fact Sheet for Patients:  StrictlyIdeas.no Fact Sheet for Healthcare Providers: BankingDealers.co.za This test is  not yet approved or cleared by the [place] and has been authorized for detection and/or diagnosis of SARS-CoV-2 by FDA under an Emergency Use Authorization (EUA).  This EUA will remain in effect (meaning this test can be used) for the duration of the COVID-19 declaration under Section 564(b)(1) of the Act, 21 U.S.C. section 360bbb-3(b)(1), unless the authorization is terminated or revoked sooner. Performed at [facility], [address], [place], [place] [address]   MRSA PCR Screening     Status: None   Collection Time: 03/30/2019  7:54 PM   Specimen: Nasal Mucosa; Nasopharyngeal  Result Value Ref Range Status   MRSA by PCR NEGATIVE NEGATIVE Final    Comment:        The GeneXpert MRSA Assay (FDA approved for NASAL specimens only), is one component of a comprehensive MRSA colonization surveillance program. It is not intended to diagnose MRSA infection nor to guide  or monitor treatment for MRSA infections. Performed at [facility], [address] [place], [place] [address]   Culture, blood (Routine X 2) w Reflex to ID Panel     Status: None   Collection Time: 03/31/2019  9:50 PM   Specimen: Left Antecubital; Blood  Result Value Ref Range Status   Specimen Description LEFT ANTECUBITAL  Final   Special Requests   Final    BOTTLES DRAWN AEROBIC AND ANAEROBIC Blood Culture adequate volume   Culture   Final    NO GROWTH 5 DAYS Performed at [facility], [address], [place], [place] [address]    Report Status 03/30/2019 FINAL  Final     Scheduled Meds:  chlorhexidine gluconate (MEDLINE KIT)  15 mL Mouth Rinse BID   Chlorhexidine Gluconate Cloth  6 each Topical Daily   feeding supplement (VITAL HIGH PROTEIN)  1,000 mL Per Tube Q24H   furosemide  60 mg Intravenous BID   insulin aspart  5 Units Subcutaneous Q4H   mouth rinse  15 mL Mouth Rinse 10 times per day   methylPREDNISolone (SOLU-MEDROL) injection  40 mg Intravenous Q12H   metoprolol tartrate  5 mg Intravenous Q8H   pantoprazole (PROTONIX) IV  40 mg Intravenous Q24H   sodium chloride flush  10-40 mL Intracatheter Q12H   sodium chloride flush  3 mL Intravenous Q12H   Continuous Infusions:  sodium chloride 10 mL/hr at 03/31/19 0600   amiodarone 30 mg/hr (04/02/19 1250)   fentaNYL infusion INTRAVENOUS 50 mcg/hr (04/02/19 1250)   heparin 1,300 Units/hr (04/02/19 1250)   meropenem (MERREM) IV Stopped (04/02/19 0854)    Procedures/Studies: Ct Chest Wo Contrast  Result Date: 03/27/2019 CLINICAL DATA:  Acute respiratory illness.  Hypoxia. EXAM: CT CHEST WITHOUT CONTRAST TECHNIQUE: Multidetector CT imaging of the chest was performed following the standard protocol without IV contrast. COMPARISON:  None FINDINGS: Cardiovascular: Moderate cardiac enlargement and small pericardial effusion. Aortic atherosclerosis. Calcification in the left main, left circumflex coronary arteries  identified. Mediastinum/Nodes: Normal appearance of the thyroid gland. The trachea appears patent and is midline. Large hiatal hernia. Nasogastric tube is identified. The tube forms a loop within the hiatal hernia and then continues into the intra-abdominal stomach. Right paratracheal lymph node measures 1.5 cm, image 53/4. Left paratracheal lymph node measures 1.6 cm, image 43/4. Subcarinal lymph node measures 2.1 cm. Hilar structures are suboptimally evaluated due to lack of IV contrast material. No axillary or supraclavicular adenopathy. Lungs/Pleura: Small bilateral pleural effusions identified. The left pleural effusion appears mildly loculated. There is subsegmental scratch set partial consolidation  in atelectasis of the right lower lobe noted. There is complete airspace consolidation of the entire left lower lobe. Partial consolidation in atelectasis of the lingula. Patchy ground-glass and airspace densities noted within the right middle lobe and central left upper lobe. Upper Abdomen: No acute abnormality. Musculoskeletal: Bones appear osteopenic. Kyphoscoliosis deformity of the thoracic spine noted. IMPRESSION: 1. Small to moderate bilateral pleural effusions. Multifocal, bilateral airspace consolidation identified compatible with pneumonia. The left lower lobe is completely consolidated/collapsed. There is partial consolidation/collapse of the right lower lobe and lingula. Patchy ground-glass and airspace densities are noted within the right middle lobe and both upper lobes. 2. Enlarged mediastinal lymph nodes. In the setting of CHF and pneumonia these are nonspecific and may be reactive. Follow-up imaging with CT of the chest in 3 months is recommended. This recommendation follows ACR consensus guidelines: Managing Incidental Findings on Thoracic CT: Mediastinal and Cardiovascular Findings. A White Paper of the ACR Incidental Findings Committee. J Am Coll Radiol. 2018; 15: 5056-9794. 3. Cardiac  enlargement and small pericardial effusion 4. Aortic Atherosclerosis (ICD10-I70.0). Coronary artery calcifications. Electronically Signed   By: Kerby Moors M.D.   On: 03/27/2019 15:36   Ct Abdomen Pelvis W Contrast  Result Date: 04/11/2019 CLINICAL DATA:  Constipation since Thursday. Vomiting. Hysterectomy. Appendectomy. High-grade bowel obstruction. EXAM: CT ABDOMEN AND PELVIS WITH CONTRAST TECHNIQUE: Multidetector CT imaging of the abdomen and pelvis was performed using the standard protocol following bolus administration of intravenous contrast. CONTRAST:  174m OMNIPAQUE IOHEXOL 300 MG/ML  SOLN COMPARISON:  None. FINDINGS: Lower chest: Right base atelectasis. Left base collapse/consolidative change. Moderate cardiomegaly with small pericardial effusion. Small bilateral pleural effusions. A large hiatal hernia, with approximately 1/2 of the stomach positioned in the lower chest. Hepatobiliary: Anterior left hepatic lobe cyst. Cholecystectomy, without biliary ductal dilatation. Pancreas: Normal, without mass or ductal dilatation. Spleen: Normal in size, without focal abnormality. Adrenals/Urinary Tract: Normal adrenal glands. Bilateral renal low-density lesions which are likely cysts and minimally complex cysts. Other renal lesions are too small to characterize. No hydronephrosis. Normal urinary bladder. Stomach/Bowel: The stomach is distended and fluid-filled. The left side of the colon is relatively decompressed with scattered diverticula. Proximal and mid small bowel loops are dilated including at up to 2.9 cm. This continues to the level of a transition point within the central pelvis, likely in the proximal ileum on image 55/3 and coronal image 41. No obstructive mass identified. No complicating ischemia. Vascular/Lymphatic: Aortic and branch vessel atherosclerosis. No abdominopelvic adenopathy. Reproductive: Hysterectomy.  No adnexal mass. Other: Mild pelvic floor laxity.  No free intraperitoneal air.  Musculoskeletal: Lumbosacral spondylosis with moderate convex left lumbar spine curvature. IMPRESSION: 1. High-grade partial small-bowel obstruction, to the level of the mid small bowel. Most likely related to adhesions. No complicating ischemia. 2. Distended stomach with large hiatal hernia. The patient may benefit from nasogastric tube placement. 3. Small bilateral pleural effusions with left worse than right base airspace disease. On the right, likely atelectasis. On the left, infection or aspiration cannot be excluded. 4. Cardiomegaly with small pericardial effusion. 5.  Aortic Atherosclerosis (ICD10-I70.0). Electronically Signed   By: KAbigail MiyamotoM.D.   On: 04/03/2019 14:02   Dg Chest Port 1 View  Result Date: 04/02/2019 CLINICAL DATA:  Respiratory failure. EXAM: PORTABLE CHEST 1 VIEW COMPARISON:  04/01/2019. FINDINGS: Endotracheal tube, left PICC line in stable position. Cardiomegaly unchanged. Diffuse bilateral pulmonary infiltrates/edema and bilateral pleural effusions again noted, no change. Persistent basilar atelectasis. No pneumothorax. IMPRESSION: 1.  Left PICC  line noted with tip over superior vena cava. 2. Cardiomegaly unchanged. Diffuse bilateral pulmonary infiltrates/edema bilateral pleural effusions again noted, no change. Persistent basilar atelectasis. Electronically Signed   By: Marcello Moores  Register   On: 04/02/2019 07:52   Dg Chest Port 1 View  Result Date: 04/01/2019 CLINICAL DATA:  Acute respiratory failure. Aspiration pneumonia. EXAM: PORTABLE CHEST 1 VIEW COMPARISON:  03/31/2019 FINDINGS: Support lines and tubes in appropriate position. Stable cardiomegaly. Diffuse bilateral airspace disease shows no significant change. Probable small bilateral pleural effusions, also stable. IMPRESSION: No significant change in diffuse bilateral airspace disease and probable bilateral pleural effusions. Stable cardiomegaly. Electronically Signed   By: Earle Gell M.D.   On: 04/01/2019 08:12   Dg  Chest Port 1 View  Result Date: 03/31/2019 CLINICAL DATA:  Respiratory failure. EXAM: PORTABLE CHEST 1 VIEW COMPARISON:  03/30/2019 FINDINGS: The patient is rotated to the left, and the patient's chin partially obscures the left lung apex. An endotracheal tube remains in place terminating approximately 1.5 cm of above the carina, not significantly changed. An enteric tube courses into the abdomen with tip not imaged. A left PICC terminates over the right atrium. There is persistent extensive airspace opacity throughout both lungs, stable to minimally improved. IMPRESSION: Stable to minimally improved extensive bilateral airspace disease. Electronically Signed   By: Logan Bores M.D.   On: 03/31/2019 09:39   Dg Chest Port 1 View  Result Date: 03/30/2019 CLINICAL DATA:  Shortness of breath. EXAM: PORTABLE CHEST 1 VIEW COMPARISON:  03/27/2019 FINDINGS: The heart is enlarged but stable.  Left PICC line appears stable. Severe and progressive bilateral airspace process. Persistent small pleural effusions. No pneumothorax. IMPRESSION: Severe and progressive bilateral airspace process. Electronically Signed   By: Marijo Sanes M.D.   On: 03/30/2019 10:41   Dg Chest Port 1 View  Result Date: 03/27/2019 CLINICAL DATA:  Acute respiratory failure with hypoxia. EXAM: PORTABLE CHEST 1 VIEW COMPARISON:  One-view chest x-ray 03/26/2019 FINDINGS: Heart is enlarged. Left hemidiaphragm is elevated. NG tube is in the stomach. Left-sided PICC line terminates in the right atrium. Pulmonary vascular congestion is increased. Bilateral infiltrates have increased. IMPRESSION: 1. Increasing bilateral infiltrates, left greater than right. This is concerning for infection. 2. Elevation of left hemidiaphragm. 3. Stable cardiomegaly. 4. Left-sided PICC line terminates in the right atrium. Electronically Signed   By: San Morelle M.D.   On: 03/27/2019 08:37   Dg Chest Port 1 View  Result Date: 03/26/2019 CLINICAL DATA:   Follow-up PICC line placement EXAM: PORTABLE CHEST 1 VIEW COMPARISON:  04/04/2019 FINDINGS: Left-sided PICC line is noted with catheter tip at the cavoatrial junction. Gastric catheter is noted coiled within the stomach. Elevation of the left hemidiaphragm is noted with some associated infiltrates bilaterally left greater than right similar to that seen on the prior exam. No bony abnormality is noted. IMPRESSION: Stable infiltrates bilaterally left greater than right. New left-sided PICC line in satisfactory position. Electronically Signed   By: Inez Catalina M.D.   On: 03/26/2019 12:46   Dg Chest Port 1 View  Result Date: 03/30/2019 CLINICAL DATA:  NG tube placement. EXAM: PORTABLE CHEST 1 VIEW COMPARISON:  03/29/2019 and CT 03/22/2019 FINDINGS: Patient is slightly rotated to the left. Nasogastric tube is coiled once over the stomach in patient's known large hiatal hernia and then proceeds inferiorly over the left upper quadrant and off the film as tip is not visualized. Non radiopaque side-port is over the stomach in the left upper abdomen. Exam demonstrates  adequate lung volumes with worsening bilateral perihilar opacification suggesting interstitial edema and less likely infection. Persistent opacification over the left base likely effusion with atelectasis. Stable mild hazy density over the right base which may represent a small effusion with atelectasis. Infection over the lung bases is possible. Stable cardiomegaly. Remainder of the exam is unchanged. IMPRESSION: Worsening bilateral perihilar opacification suggesting worsening interstitial edema. Stable bibasilar opacification likely small effusions left greater than right with associated basilar atelectasis. Infection in the mid to lower lungs is possible. Stable cardiomegaly. Nasogastric tube coiled once over the large hiatal hernia with side-port over the stomach in the left upper quadrant and tip not visualized. Electronically Signed   By: Marin Olp  M.D.   On: 03/24/2019 20:28   Dg Chest Portable 1 View  Result Date: 04/01/2019 CLINICAL DATA:  Nasogastric tube placement EXAM: PORTABLE CHEST 1 VIEW COMPARISON:  Abdominal CT from earlier today FINDINGS: Nasogastric tube which at least reaches the stomach (which is low lying and distended by CT). There is haziness at both lung bases where there is atelectasis and pleural fluid by prior CT. Cardiopericardial enlargement. IMPRESSION: 1. The nasogastric tube reaches the stomach at least. 2. Cardiomegaly with bilateral pleural effusion obscuring the lower lobes. Electronically Signed   By: Monte Fantasia M.D.   On: 03/20/2019 16:36   Dg Chest Port 1v Same Day  Result Date: 03/30/2019 CLINICAL DATA:  Status post intubation and OG tube placement today. EXAM: PORTABLE CHEST 1 VIEW COMPARISON:  Single-view of the chest earlier today. FINDINGS: Endotracheal tube tip is 1.5 cm above the carina. The tube could be withdrawn 1-2 cm for better positioning. OG tube courses into the stomach and below the inferior margin the film. Severe and diffuse bilateral airspace disease is again seen. Cardiac silhouette is obscured. No pneumothorax is identified. IMPRESSION: ETT tip is 1.5 cm above the carina. The tube could be withdrawn 1-2 cm. OG tube courses into the stomach and below the inferior margin the film. No change in diffuse bilateral airspace disease. Electronically Signed   By: Inge Rise M.D.   On: 03/30/2019 11:53   Dg Abd 2 Views  Result Date: 03/30/2019 CLINICAL DATA:  Follow up small bowel obstruction EXAM: ABDOMEN - 2 VIEW COMPARISON:  03/26/2019 FINDINGS: Stable scoliosis is noted. Bibasilar infiltrative changes are seen. A few persistent loops of dilated small bowel are seen. No free air is noted. The overall appearance has improved slightly in the interval from the prior exam. IMPRESSION: Slight improvement in mild small bowel dilatation. No new focal abnormality is seen. Electronically Signed   By:  Inez Catalina M.D.   On: 03/30/2019 08:08   Dg Abd Portable 2v  Result Date: 03/26/2019 CLINICAL DATA:  Small bowel obstruction, colon resection. EXAM: PORTABLE ABDOMEN - 2 VIEW COMPARISON:  CT abdomen pelvis 04/16/2019. FINDINGS: Nasogastric tube terminates in the stomach. Persistent small bowel dilatation. No minimal colonic gas. Retained contrast in the bladder with Foley catheter in place. Postoperative changes in the left lower quadrant. Incidental imaging of the lower chest shows left lower lobe consolidation with patchy airspace opacification bilaterally. Bilateral pleural effusions are noted as well. IMPRESSION: 1. Persistent small bowel obstruction. 2. Left lower lobe consolidation with patchy bilateral airspace opacification and bilateral pleural effusions, better evaluated on chest radiograph done the same day. Electronically Signed   By: Lorin Picket M.D.   On: 03/26/2019 12:46    Barton Dubois, MD  [facility] Pager [phone]   04/02/2019, 2:18 PM  LOS: 8 days

## 2019-04-02 NOTE — Progress Notes (Addendum)
ANTICOAGULATION CONSULT NOTE - Follow Up Consult  Pharmacy Consult for heparin Indication: atrial fibrillation  Labs: Recent Labs    03/31/19 0217  04/01/19 0450 04/01/19 1350 04/02/19 0414  HGB 11.0*  --  13.0  --   --   HCT 36.8  --  41.5  --   --   PLT 168  --  220  --   --   HEPARINUNFRC 0.22*   < > 0.14* 0.53 0.67  CREATININE 0.81  --  1.01*  --  1.06*   < > = values in this interval not displayed.    Assessment: 85yo female with history of atrial fibrillation now therapeutic on heparin after rate changes.  6/14AM HL 0.14 @ 1000 units/hr, subtherapeutic    6/14PM HL 0.53 @ 1300 units/hr, therapeutic    6/15: HL 0.67  Goal of Therapy:  Heparin level 0.3-0.7 units/ml  Monitor platelets per protocol   Plan:  Continue heparin gtt @ 1300 units/hr and check level in am.  Monitor labs and s/s of bleeding     Margot Ables, PharmD Clinical Pharmacist 04/02/2019 7:42 AM

## 2019-04-02 NOTE — Care Management Important Message (Signed)
Important Message  Patient Details  Name: Carrie Morales MRN: [id number] Date of Birth: 05-22-1928   Medicare Important Message Given:  Yes(copy was placed at bedside)    Tommy Medal 04/02/2019, 4:12 PM

## 2019-04-02 NOTE — Progress Notes (Signed)
Progress Note  Patient Name: Carrie Morales Date of Encounter: 04/02/2019  Primary Cardiologist: Dr Darral Dash  Subjective   No events overnight  Inpatient Medications    Scheduled Meds: . chlorhexidine gluconate (MEDLINE KIT)  15 mL Mouth Rinse BID  . Chlorhexidine Gluconate Cloth  6 each Topical Daily  . feeding supplement (VITAL HIGH PROTEIN)  1,000 mL Per Tube Q24H  . furosemide  40 mg Intravenous BID  . insulin aspart  5 Units Subcutaneous Q4H  . mouth rinse  15 mL Mouth Rinse 10 times per day  . methylPREDNISolone (SOLU-MEDROL) injection  40 mg Intravenous Q12H  . metoprolol tartrate  5 mg Intravenous Q8H  . pantoprazole (PROTONIX) IV  40 mg Intravenous Q24H  . sodium chloride flush  10-40 mL Intracatheter Q12H  . sodium chloride flush  3 mL Intravenous Q12H   Continuous Infusions: . sodium chloride 10 mL/hr at 03/31/19 0600  . amiodarone 30 mg/hr (04/02/19 0400)  . fentaNYL infusion INTRAVENOUS 50 mcg/hr (04/02/19 0400)  . heparin 1,300 Units/hr (04/02/19 0400)  . meropenem (MERREM) IV 1,000 mg (04/02/19 0823)   PRN Meds: sodium chloride, acetaminophen **OR** acetaminophen, albuterol, bisacodyl, fentaNYL, midazolam, midazolam, ondansetron **OR** ondansetron (ZOFRAN) IV, phenol, polyethylene glycol, sodium chloride flush, sodium chloride flush, traZODone   Vital Signs    Vitals:   04/02/19 0600 04/02/19 0630 04/02/19 0740 04/02/19 0745  BP: 115/65 121/66    Pulse:      Resp: 19 17  (!) 25  Temp:    (!) 96.4 F (35.8 C)  TempSrc:    Axillary  SpO2: 97% 98% 97% 97%  Weight:      Height:   '5\' 4"'$  (1.626 m)     Intake/Output Summary (Last 24 hours) at 04/02/2019 0829 Last data filed at 04/02/2019 0400 Gross per 24 hour  Intake 1361.64 ml  Output 1350 ml  Net 11.64 ml   Last 3 Weights 04/02/2019 04/01/2019 03/30/2019  Weight (lbs) 117 lb 11.6 oz 113 lb 1.5 oz 109 lb 9.1 oz  Weight (kg) 53.4 kg 51.3 kg 49.7 kg      Telemetry    afib 80s to 120s -  Personally Reviewed  ECG    n/a - Personally Reviewed  Physical Exam   GEN: No acute distress.   Neck: No JVD Cardiac: irreg, 2/6 systoic murmur rusb Respiratory: coarse bilaterally GI: Soft, nontender, non-distended  MS: No edema; No deformity. Neuro:  Nonfocal  Psych: Normal affect   Labs    Chemistry Recent Labs  Lab 03/27/19 0612 03/28/19 0425  03/31/19 0217 04/01/19 0450 04/02/19 0414  NA 140 139   < > 146* 143 148*  K 4.2 4.3   < > 6.1* 4.4 3.7  CL 106 107   < > 112* 107 108  CO2 26 24   < > '27 30 31  '$ GLUCOSE 89 132*   < > 318* 188* 164*  BUN 38* 44*   < > 35* 47* 66*  CREATININE 1.32* 1.14*   < > 0.81 1.01* 1.06*  CALCIUM 9.2 9.4   < > 9.6 9.9 10.1  PROT 5.3* 5.2*  --   --   --   --   ALBUMIN 2.5* 2.4*  --   --   --   --   AST 18 16  --   --   --   --   ALT 17 16  --   --   --   --   ALKPHOS 40  53  --   --   --   --   BILITOT 1.0 1.2  --   --   --   --   GFRNONAA 36* 43*   < > >60 50* 47*  GFRAA 42* 50*   < > >60 58* 54*  ANIONGAP 8 8   < > '7 6 9   '$ < > = values in this interval not displayed.     Hematology Recent Labs  Lab 03/30/19 0444 03/31/19 0217 04/01/19 0450  WBC 17.8* 14.9* 18.0*  RBC 4.02 3.66* 4.23  HGB 12.0 11.0* 13.0  HCT 40.0 36.8 41.5  MCV 99.5 100.5* 98.1  MCH 29.9 30.1 30.7  MCHC 30.0 29.9* 31.3  RDW 14.2 14.5 14.4  PLT 224 168 220    Cardiac EnzymesNo results for input(s): TROPONINI in the last 168 hours. No results for input(s): TROPIPOC in the last 168 hours.   BNP Recent Labs  Lab 03/26/19 0830  BNP 950.0*     DDimer No results for input(s): DDIMER in the last 168 hours.   Radiology    Dg Chest Port 1 View  Result Date: 04/02/2019 CLINICAL DATA:  Respiratory failure. EXAM: PORTABLE CHEST 1 VIEW COMPARISON:  04/01/2019. FINDINGS: Endotracheal tube, left PICC line in stable position. Cardiomegaly unchanged. Diffuse bilateral pulmonary infiltrates/edema and bilateral pleural effusions again noted, no change.  Persistent basilar atelectasis. No pneumothorax. IMPRESSION: 1.  Left PICC line noted with tip over superior vena cava. 2. Cardiomegaly unchanged. Diffuse bilateral pulmonary infiltrates/edema bilateral pleural effusions again noted, no change. Persistent basilar atelectasis. Electronically Signed   By: Marcello Moores  Register   On: 04/02/2019 07:52   Dg Chest Port 1 View  Result Date: 04/01/2019 CLINICAL DATA:  Acute respiratory failure. Aspiration pneumonia. EXAM: PORTABLE CHEST 1 VIEW COMPARISON:  03/31/2019 FINDINGS: Support lines and tubes in appropriate position. Stable cardiomegaly. Diffuse bilateral airspace disease shows no significant change. Probable small bilateral pleural effusions, also stable. IMPRESSION: No significant change in diffuse bilateral airspace disease and probable bilateral pleural effusions. Stable cardiomegaly. Electronically Signed   By: Earle Gell M.D.   On: 04/01/2019 08:12    Cardiac Studies    Patient Profile     85 y.o. female with a history of permanent atrial fibrillation diagnosed in December 2019 (also intermittent atrial flutter based on records) andhypertension admitted 06/07 with small bowel obstruction and sepsis secondary to pneumonia.   Assessment & Plan    1. Permanent afib/intermittent aflutter - issues with tachycardia in setting of severe systemic illness - low bp's on dilt gtt, changed to amio for rate control - she is on hep gtt    2. Acute diastolic HF - 03/5992 echo: LVEF >65%, indeterminate diastolic function - she is on lasix IV '40mg'$  bid. +64m yesterday, +7.5 L since admission. Mild fluctuations in Cr without clear trend - check CVP from PICC line  - try higher dose lasix '60mg'$  IV bid to see if can keep net negative  3.  Heart murmur secondary to hyperdynamic LVEF with small LVOT and turbulent flow associated with anterior systolic motion of the mitral valve. - see echo report above - no additional eval needed at this time - need to  watch volume, currently wet, but must not get too dry  4. SBO - per primary team  5. Sepsis with pneumonia and respiratory failure with ARDS - per primary team and pulmonary - remains intubated, on ARDS protocol  6.  Incidentally noted coronary and aortic calcifications  by CT imaging. - no further eval at this time, f/u as outpt  7. Moderate pericardial effusion - noted on echo 03/2019, no evidence of tamponade  8. Mitral regurgitation - moderate by echo   For questions or updates, please contact [place] Please consult [email or web address] for contact info under        Signed, Carlyle Dolly, MD  04/02/2019, 8:29 AM

## 2019-04-03 ENCOUNTER — Inpatient Hospital Stay (HOSPITAL_COMMUNITY): Payer: Medicare Other

## 2019-04-03 LAB — BLOOD GAS, ARTERIAL
Acid-Base Excess: 9.8 mmol/L — ABNORMAL HIGH (ref 0.0–2.0)
Bicarbonate: 32.4 mmol/L — ABNORMAL HIGH (ref 20.0–28.0)
Drawn by: 21310
FIO2: 40
MECHVT: 430 mL
O2 Saturation: 87.9 %
PEEP: 5 cmH2O
Patient temperature: 37
RATE: 24 resp/min
pCO2 arterial: 53.5 mmHg — ABNORMAL HIGH (ref 32.0–48.0)
pH, Arterial: 7.425 (ref 7.350–7.450)
pO2, Arterial: 56.2 mmHg — ABNORMAL LOW (ref 83.0–108.0)

## 2019-04-03 LAB — BASIC METABOLIC PANEL
Anion gap: 12 (ref 5–15)
BUN: 75 mg/dL — ABNORMAL HIGH (ref 8–23)
CO2: 30 mmol/L (ref 22–32)
Calcium: 9.5 mg/dL (ref 8.9–10.3)
Chloride: 106 mmol/L (ref 98–111)
Creatinine, Ser: 0.98 mg/dL (ref 0.44–1.00)
GFR calc Af Amer: 60 mL/min — ABNORMAL LOW (ref >60)
GFR calc non Af Amer: 52 mL/min — ABNORMAL LOW (ref >60)
Glucose, Bld: 166 mg/dL — ABNORMAL HIGH (ref 70–99)
Potassium: 3.1 mmol/L — ABNORMAL LOW (ref 3.5–5.1)
Sodium: 148 mmol/L — ABNORMAL HIGH (ref 135–145)

## 2019-04-03 LAB — CBC
HCT: 40.5 % (ref 36.0–46.0)
Hemoglobin: 12.6 g/dL (ref 12.0–15.0)
MCH: 29.7 pg (ref 26.0–34.0)
MCHC: 31.1 g/dL (ref 30.0–36.0)
MCV: 95.5 fL (ref 80.0–100.0)
Platelets: 228 10*3/uL (ref 150–400)
RBC: 4.24 MIL/uL (ref 3.87–5.11)
RDW: 14.6 % (ref 11.5–15.5)
WBC: 20.6 10*3/uL — ABNORMAL HIGH (ref 4.0–10.5)
nRBC: 0 % (ref 0.0–0.2)

## 2019-04-03 LAB — HEPARIN LEVEL (UNFRACTIONATED)
Heparin Unfractionated: 1.32 IU/mL — ABNORMAL HIGH (ref 0.30–0.70)
Heparin Unfractionated: 1.32 IU/mL — ABNORMAL HIGH (ref 0.30–0.70)
Heparin Unfractionated: 1.22 IU/mL — ABNORMAL HIGH (ref 0.30–0.70)

## 2019-04-03 LAB — GLUCOSE, CAPILLARY
Glucose-Capillary: 129 mg/dL — ABNORMAL HIGH (ref 70–99)
Glucose-Capillary: 137 mg/dL — ABNORMAL HIGH (ref 70–99)
Glucose-Capillary: 139 mg/dL — ABNORMAL HIGH (ref 70–99)
Glucose-Capillary: 140 mg/dL — ABNORMAL HIGH (ref 70–99)
Glucose-Capillary: 143 mg/dL — ABNORMAL HIGH (ref 70–99)
Glucose-Capillary: 148 mg/dL — ABNORMAL HIGH (ref 70–99)
Glucose-Capillary: 152 mg/dL — ABNORMAL HIGH (ref 70–99)

## 2019-04-03 MED ORDER — FREE WATER
100.0000 mL | Freq: Three times a day (TID) | Status: DC
  Administered 2019-04-03 – 2019-04-05 (×5): 100 mL

## 2019-04-03 MED ORDER — POTASSIUM CHLORIDE 20 MEQ/15ML (10%) PO SOLN
40.0000 meq | Freq: Once | ORAL | Status: AC
  Administered 2019-04-03: 10:00:00 40 meq via ORAL
  Filled 2019-04-03: qty 30

## 2019-04-03 MED ORDER — OSMOLITE 1.5 CAL PO LIQD
1000.0000 mL | ORAL | Status: DC
  Administered 2019-04-03 – 2019-04-09 (×7): 1000 mL
  Filled 2019-04-03 (×9): qty 1000

## 2019-04-03 MED ORDER — HEPARIN (PORCINE) 25000 UT/250ML-% IV SOLN
1000.0000 [IU]/h | INTRAVENOUS | Status: DC
  Administered 2019-04-03: 1000 [IU]/h via INTRAVENOUS
  Filled 2019-04-03: qty 250

## 2019-04-03 MED ORDER — HEPARIN (PORCINE) 25000 UT/250ML-% IV SOLN: 1150.0000 [IU]/h | INTRAVENOUS | Status: DC

## 2019-04-03 MED ORDER — PRO-STAT SUGAR FREE PO LIQD: 30.0000 mL | Freq: Three times a day (TID) | ORAL | Status: DC

## 2019-04-03 MED ORDER — PRO-STAT SUGAR FREE PO LIQD
30.0000 mL | Freq: Three times a day (TID) | ORAL | Status: DC
  Administered 2019-04-03 – 2019-04-10 (×21): 30 mL
  Filled 2019-04-03 (×19): qty 30

## 2019-04-03 NOTE — Progress Notes (Signed)
ANTICOAGULATION CONSULT NOTE - Follow Up Consult  Pharmacy Consult for heparin Indication: atrial fibrillation  HEPARIN DW (KG): 47.6   Labs: Recent Labs    04/01/19 0450  04/02/19 0414 04/03/19 0508 04/03/19 0822 04/03/19 1849  HGB 13.0  --   --  12.6  --   --   HCT 41.5  --   --  40.5  --   --   PLT 220  --   --  228  --   --   HEPARINUNFRC 0.14*   < > 0.67 1.32* 1.32* 1.22*  CREATININE 1.01*  --  1.06* 0.98  --   --    < > = values in this interval not displayed.    Assessment: 85yo female with history of atrial fibrillation. Heparin is supratherapeutic now at 1.32 and had been therapeutic for ~48 hours. Had HL redrawn to confirm; discussed with RN and she did the blood draw from PICC line on right arm and heparin infusing on left arm.  6/16 at 1849 HL remains supratherapeutic at 1.22, confirmed with RN drawn from opposite arm.  Goal of Therapy:  Heparin level 0.3-0.7 units/ml  Monitor platelets per protocol   Plan:  Hold heparin for 1 hour, then decrease heparin gtt to 1000 units/hr and check level in ~8 hrs and every am.  Monitor labs and s/s of bleeding     Isac Sarna, BS [place], [place] Clinical Pharmacist Pager [phone] 04/03/2019 8:49 PM

## 2019-04-03 NOTE — Progress Notes (Signed)
PROGRESS NOTE  Carrie Morales BSJ:[id number] DOB: [birth year] DOA: 03/23/2019 PCP: Earney Mallet, MD  Brief History:  85 year old female with a history of chronic atrial fibrillation on apixaban, diastolic CHF, hypertension, bowel obstruction status post bowel resection 30 years ago presenting with 1 week history of abdominal pain that worsened over the past 2 to 3 days to the point of resulting in nausea and vomiting.  The patient was also having constipation-like symptoms and tried some over-the-counter laxatives without relief.  Because of her progressive symptoms she presented for further evaluation.  CT of the abdomen and pelvis in the ED showed left basilar collapse less consolidative changes, small bilateral pleural effusions, left greater than right with a large hiatus hernia.  There was also dilatation of the proximal and mid small bowel loops up to 2.9 cm consistent with a high-grade SBO.  The patient was treated with bowel rest and IV fluids.  When she arrived to the medical floor, the patient developed atrial fibrillation with RVR and respiratory distress.  She was transferred to the stepdown unit and started on diltiazem drip and placed on BiPAP.  Subsequently, the patient became hypotensive requiring fluid resuscitation.  Repeat chest x-ray show worsening bilateral perihilar densities with stable bibasilar opacifications.  Lactic acid peaked at 4.4.  The patient was started on ceftriaxone and metronidazole.  Assessment/Plan: Sepsis -Present at the time of admission -Secondary to aspiration pneumonia/CAP -Lactic acid peaked at 4.4; now normal. -procalcitonin  Peaked at 5.86 -WBCs started to trend down; which is something that changed now that the after initiation of steroids. -Follow blood culture--neg so far -Urinalysis negative -Continue current IV antibiotics and supportive care.  Acute respiratory failure with hypoxia hypercapnia -Secondary to pneumonia, ARDS  and acute on chronic heart failure. -Patient failed response of BiPAP and in the requiring intubation and  mechanical ventilation -Follow ARDS protocol -Appreciate pulmonology's assistance and recommendations -Continue current IV antibiotics; aiming for a total of 10 days. -Continue Solu-Medrol 40 mg every 12 hours as recommended by pulmonologist. -continue Diuresis as tolerated -ABG demonstrating improvement in PO2 and CO2 -Follow clinical response.  Small bowel obstruction -General surgery consult appreciated -6/15--case discussed with Dr. Constance Haw -Patient having bowel movements -SBO essentially resolved -Continue electrolytes monitoring and repletion as needed. -Continue Protonix -Continue tube feedings; so far well-tolerated and now at goal.  Patient having positive bowel movements.   -Repeated 2 views abdominal x-ray demonstrated improvement in the small bowel dilatation and not new focal abnormality.  Atrial fibrillation with RVR -Precipitated by her acute medical condition -Will continue metoprolol and amiodarone. -Heart rate is better controlled -Continue heparin drip -Overall with improved rate control. -Echocardiogram--EF >65%, no WMA, mod MR -Appreciate cardiology consult  Acute on chronic diastolic CHF -Continue daily weights -Follow strict I's and O's -Continue IV Lasix; but following cardiology recommendations and taking the patches of improvement in her blood pressure will increase to 60 mg every 12 hours and follow volume status closely. -Start checking CVP's.  -Good urine output appreciated.  Acute on chronic renal failure: Stage III at baseline.   -Secondary to hemodynamic changes and hypotension. -Baseline creatinine~0.8-1.1 -monitor with diuresis -Creatinine peak of 1.3; and then down to 1.1 -Will continue to monitor trend especially while receiving IV Lasix now.  Hyperglycemia -check A1C--5.3 -Elevated blood sugars in the setting of stress  demargination.   Disposition Plan:   Remain in ICU Family Communication:   Son updated at bedside 6/13  Consultants:  General surgery; Cardiology; pulmonology  Code Status:  FULL  DVT Prophylaxis:   Continue IV Heparin   Procedures: As Listed in Progress Note Above  Antibiotics: Ceftriaxone/flagyl 6/7>>6/8 Merrem 6/8>>> anticipated last day of antibiotics 04/04/2019.  Patient remains critically ill with multiorgan system failure requiring high complexity decision making for assessment and support, frequent evaluation and titration of therapies, application of advance monitoring technologies and extensive interpretation of multiple databases.  Case discussed with cardiology service and pulmonologist.  Patient's son updated at bedside.  Remains intubated and mechanically ventilated, requiring IV diuresis with continuation of IV antibiotics.  Heart rate much better controlled with the use of amiodarone and metoprolol.  Hopefully stable to initiate spontaneous breathing trials in the next 24 to 48 hours with soon extubation plan after that.  Time: 35 minutes.   Subjective: No fever.  Remains intubated and mechanically ventilated.  No nausea, no vomiting, no significant residual from tube feedings and positive bowel movements.  FiO2 down to 50% and PEEP of 5   Objective: Vitals:   04/03/19 1100 04/03/19 1103 04/03/19 1130 04/03/19 1131  BP: 112/62  107/61   Pulse:      Resp: (!) 25 (!) 22 (!) 24   Temp:   (!) 96.8 F (36 C)   TempSrc:   Axillary   SpO2: 94%  (!) 89% 99%  Weight:      Height:        Intake/Output Summary (Last 24 hours) at 04/03/2019 1203 Last data filed at 04/03/2019 0819 Gross per 24 hour  Intake 1593.95 ml  Output 2825 ml  Net -1231.05 ml   Weight change: -0.9 kg  Exam: General exam: Alert, awake, oriented x 3; following commands appropriately, and communicating with hand gestures.  Remains intubated and mechanically ventilated.  No fever.   Tolerating tube feedings and having appropriate bowel movements. Respiratory system: improved air movements, no frank crackles, no wheezing.  Decreased breath sounds is still appreciated at bedside.  Positive rhonchi.   Cardiovascular system: Irregular, no rubs, no gallops, no JVD. Gastrointestinal system: Abdomen is nondistended, soft and nontender. No organomegaly or masses felt. Normal bowel sounds heard. Central nervous system: Alert and oriented. No focal neurological deficits. Extremities: No C/C/E, +pedal pulses Skin: No rashes, lesions or ulcers Psychiatry: Mood & affect appropriate.    Data Reviewed: I have personally reviewed following labs and imaging studies   Basic Metabolic Panel: Recent Labs  Lab 03/29/19 0453 03/31/19 0217 03/31/19 1245 03/31/19 1919 04/01/19 0450 04/01/19 1711 04/02/19 0414 04/03/19 0508  NA 141 146*  --   --  143  --  148* 148*  K 4.6 6.1*  --   --  4.4  --  3.7 3.1*  CL 108 112*  --   --  107  --  108 106  CO2 29 27  --   --  30  --  31 30  GLUCOSE 141* 318*  --   --  188*  --  164* 166*  BUN 39* 35*  --   --  47*  --  66* 75*  CREATININE 0.79 0.81  --   --  1.01*  --  1.06* 0.98  CALCIUM 9.8 9.6  --   --  9.9  --  10.1 9.5  MG 2.3  --  2.1 2.1 2.2 2.1  --   --   PHOS  --   --  2.7 3.0 2.9 2.9  --   --    Liver Function  Tests: Recent Labs  Lab 03/28/19 0425  AST 16  ALT 16  ALKPHOS 53  BILITOT 1.2  PROT 5.2*  ALBUMIN 2.4*   CBC: Recent Labs  Lab 03/29/19 0453 03/30/19 0444 03/31/19 0217 04/01/19 0450 04/03/19 0508  WBC 18.0* 17.8* 14.9* 18.0* 20.6*  HGB 11.8* 12.0 11.0* 13.0 12.6  HCT 38.8 40.0 36.8 41.5 40.5  MCV 98.5 99.5 100.5* 98.1 95.5  PLT 223 224 168 220 228   CBG: Recent Labs  Lab 04/02/19 2028 04/02/19 2359 04/03/19 0407 04/03/19 0735 04/03/19 1129  GLUCAP 160* 152* 148* 143* 140*   Urine analysis:    Component Value Date/Time   COLORURINE AMBER (A) 04/01/2019 1158   APPEARANCEUR HAZY (A)  04/08/2019 1158   LABSPEC 1.027 03/27/2019 1158   PHURINE 5.0 04/15/2019 1158   GLUCOSEU NEGATIVE 04/09/2019 1158   HGBUR NEGATIVE 04/03/2019 1158   BILIRUBINUR NEGATIVE 03/27/2019 1158   KETONESUR 20 (A) 04/14/2019 1158   PROTEINUR 100 (A) 03/31/2019 1158   UROBILINOGEN 0.2 07/24/2014 1534   NITRITE NEGATIVE 03/21/2019 1158   LEUKOCYTESUR NEGATIVE 03/22/2019 1158    Recent Results (from the past 240 hour(s))  SARS Coronavirus 2 (CEPHEID - Performed in [place] hospital lab), Hosp Order     Status: None   Collection Time: 04/16/2019  2:26 PM   Specimen: Nasopharyngeal Swab  Result Value Ref Range Status   SARS Coronavirus 2 NEGATIVE NEGATIVE Final    Comment: (NOTE) If result is NEGATIVE SARS-CoV-2 target nucleic acids are NOT DETECTED. The SARS-CoV-2 RNA is generally detectable in upper and lower  respiratory specimens during the acute phase of infection. The lowest  concentration of SARS-CoV-2 viral copies this assay can detect is 250  copies / mL. A negative result does not preclude SARS-CoV-2 infection  and should not be used as the sole basis for treatment or other  patient management decisions.  A negative result may occur with  improper specimen collection / handling, submission of specimen other  than nasopharyngeal swab, presence of viral mutation(s) within the  areas targeted by this assay, and inadequate number of viral copies  (<250 copies / mL). A negative result must be combined with clinical  observations, patient history, and epidemiological information. If result is POSITIVE SARS-CoV-2 target nucleic acids are DETECTED. The SARS-CoV-2 RNA is generally detectable in upper and lower  respiratory specimens dur ing the acute phase of infection.  Positive  results are indicative of active infection with SARS-CoV-2.  Clinical  correlation with patient history and other diagnostic information is  necessary to determine patient infection status.  Positive results do    not rule out bacterial infection or co-infection with other viruses. If result is PRESUMPTIVE POSTIVE SARS-CoV-2 nucleic acids MAY BE PRESENT.   A presumptive positive result was obtained on the submitted specimen  and confirmed on repeat testing.  While 2019 novel coronavirus  (SARS-CoV-2) nucleic acids may be present in the submitted sample  additional confirmatory testing may be necessary for epidemiological  and / or clinical management purposes  to differentiate between  SARS-CoV-2 and other Sarbecovirus currently known to infect humans.  If clinically indicated additional testing with an alternate test  methodology [phone]) is advised. The SARS-CoV-2 RNA is generally  detectable in upper and lower respiratory sp ecimens during the acute  phase of infection. The expected result is Negative. Fact Sheet for Patients:  StrictlyIdeas.no Fact Sheet for Healthcare Providers: BankingDealers.co.za This test is not yet approved or cleared by the [place]  FDA and has been authorized for detection and/or diagnosis of SARS-CoV-2 by FDA under an Emergency Use Authorization (EUA).  This EUA will remain in effect (meaning this test can be used) for the duration of the COVID-19 declaration under Section 564(b)(1) of the Act, 21 U.S.C. section 360bbb-3(b)(1), unless the authorization is terminated or revoked sooner. Performed at [facility], [address], [place], [place] [address]   MRSA PCR Screening     Status: None   Collection Time: 03/19/2019  7:54 PM   Specimen: Nasal Mucosa; Nasopharyngeal  Result Value Ref Range Status   MRSA by PCR NEGATIVE NEGATIVE Final    Comment:        The GeneXpert MRSA Assay (FDA approved for NASAL specimens only), is one component of a comprehensive MRSA colonization surveillance program. It is not intended to diagnose MRSA infection nor to guide or monitor treatment for MRSA infections. Performed  at [facility], [address] [place], [place] [address]   Culture, blood (Routine X 2) w Reflex to ID Panel     Status: None   Collection Time: 04/11/2019  9:50 PM   Specimen: Left Antecubital; Blood  Result Value Ref Range Status   Specimen Description LEFT ANTECUBITAL  Final   Special Requests   Final    BOTTLES DRAWN AEROBIC AND ANAEROBIC Blood Culture adequate volume   Culture   Final    NO GROWTH 5 DAYS Performed at [facility], [address], [place], [place] [address]    Report Status 03/30/2019 FINAL  Final     Scheduled Meds:  chlorhexidine gluconate (MEDLINE KIT)  15 mL Mouth Rinse BID   Chlorhexidine Gluconate Cloth  6 each Topical Daily   feeding supplement (VITAL HIGH PROTEIN)  1,000 mL Per Tube Q24H   furosemide  60 mg Intravenous BID   insulin aspart  5 Units Subcutaneous Q4H   mouth rinse  15 mL Mouth Rinse 10 times per day   methylPREDNISolone (SOLU-MEDROL) injection  40 mg Intravenous Q12H   metoprolol tartrate  5 mg Intravenous Q8H   pantoprazole (PROTONIX) IV  40 mg Intravenous Q24H   sodium chloride flush  10-40 mL Intracatheter Q12H   sodium chloride flush  3 mL Intravenous Q12H   Continuous Infusions:  sodium chloride 10 mL/hr at 03/31/19 0600   amiodarone 30 mg/hr (04/03/19 0956)   fentaNYL infusion INTRAVENOUS 75 mcg/hr (04/03/19 0400)   heparin 1,150 Units/hr (04/03/19 1028)   meropenem (MERREM) IV 1,000 mg (04/03/19 0815)    Procedures/Studies: Ct Chest Wo Contrast  Result Date: 03/27/2019 CLINICAL DATA:  Acute respiratory illness.  Hypoxia. EXAM: CT CHEST WITHOUT CONTRAST TECHNIQUE: Multidetector CT imaging of the chest was performed following the standard protocol without IV contrast. COMPARISON:  None FINDINGS: Cardiovascular: Moderate cardiac enlargement and small pericardial effusion. Aortic atherosclerosis. Calcification in the left main, left circumflex coronary arteries identified. Mediastinum/Nodes: Normal appearance of the  thyroid gland. The trachea appears patent and is midline. Large hiatal hernia. Nasogastric tube is identified. The tube forms a loop within the hiatal hernia and then continues into the intra-abdominal stomach. Right paratracheal lymph node measures 1.5 cm, image 53/4. Left paratracheal lymph node measures 1.6 cm, image 43/4. Subcarinal lymph node measures 2.1 cm. Hilar structures are suboptimally evaluated due to lack of IV contrast material. No axillary or supraclavicular adenopathy. Lungs/Pleura: Small bilateral pleural effusions identified. The left pleural effusion appears mildly loculated. There is subsegmental scratch set partial consolidation in atelectasis of the right lower lobe noted.  There is complete airspace consolidation of the entire left lower lobe. Partial consolidation in atelectasis of the lingula. Patchy ground-glass and airspace densities noted within the right middle lobe and central left upper lobe. Upper Abdomen: No acute abnormality. Musculoskeletal: Bones appear osteopenic. Kyphoscoliosis deformity of the thoracic spine noted. IMPRESSION: 1. Small to moderate bilateral pleural effusions. Multifocal, bilateral airspace consolidation identified compatible with pneumonia. The left lower lobe is completely consolidated/collapsed. There is partial consolidation/collapse of the right lower lobe and lingula. Patchy ground-glass and airspace densities are noted within the right middle lobe and both upper lobes. 2. Enlarged mediastinal lymph nodes. In the setting of CHF and pneumonia these are nonspecific and may be reactive. Follow-up imaging with CT of the chest in 3 months is recommended. This recommendation follows ACR consensus guidelines: Managing Incidental Findings on Thoracic CT: Mediastinal and Cardiovascular Findings. A White Paper of the ACR Incidental Findings Committee. J Am Coll Radiol. 2018; 15: 7510-2585. 3. Cardiac enlargement and small pericardial effusion 4. Aortic  Atherosclerosis (ICD10-I70.0). Coronary artery calcifications. Electronically Signed   By: Kerby Moors M.D.   On: 03/27/2019 15:36   Ct Abdomen Pelvis W Contrast  Result Date: 03/30/2019 CLINICAL DATA:  Constipation since Thursday. Vomiting. Hysterectomy. Appendectomy. High-grade bowel obstruction. EXAM: CT ABDOMEN AND PELVIS WITH CONTRAST TECHNIQUE: Multidetector CT imaging of the abdomen and pelvis was performed using the standard protocol following bolus administration of intravenous contrast. CONTRAST:  1101m OMNIPAQUE IOHEXOL 300 MG/ML  SOLN COMPARISON:  None. FINDINGS: Lower chest: Right base atelectasis. Left base collapse/consolidative change. Moderate cardiomegaly with small pericardial effusion. Small bilateral pleural effusions. A large hiatal hernia, with approximately 1/2 of the stomach positioned in the lower chest. Hepatobiliary: Anterior left hepatic lobe cyst. Cholecystectomy, without biliary ductal dilatation. Pancreas: Normal, without mass or ductal dilatation. Spleen: Normal in size, without focal abnormality. Adrenals/Urinary Tract: Normal adrenal glands. Bilateral renal low-density lesions which are likely cysts and minimally complex cysts. Other renal lesions are too small to characterize. No hydronephrosis. Normal urinary bladder. Stomach/Bowel: The stomach is distended and fluid-filled. The left side of the colon is relatively decompressed with scattered diverticula. Proximal and mid small bowel loops are dilated including at up to 2.9 cm. This continues to the level of a transition point within the central pelvis, likely in the proximal ileum on image 55/3 and coronal image 41. No obstructive mass identified. No complicating ischemia. Vascular/Lymphatic: Aortic and branch vessel atherosclerosis. No abdominopelvic adenopathy. Reproductive: Hysterectomy.  No adnexal mass. Other: Mild pelvic floor laxity.  No free intraperitoneal air. Musculoskeletal: Lumbosacral spondylosis with  moderate convex left lumbar spine curvature. IMPRESSION: 1. High-grade partial small-bowel obstruction, to the level of the mid small bowel. Most likely related to adhesions. No complicating ischemia. 2. Distended stomach with large hiatal hernia. The patient may benefit from nasogastric tube placement. 3. Small bilateral pleural effusions with left worse than right base airspace disease. On the right, likely atelectasis. On the left, infection or aspiration cannot be excluded. 4. Cardiomegaly with small pericardial effusion. 5.  Aortic Atherosclerosis (ICD10-I70.0). Electronically Signed   By: KAbigail MiyamotoM.D.   On: 04/13/2019 14:02   Dg Chest Port 1 View  Result Date: 04/02/2019 CLINICAL DATA:  Respiratory failure. EXAM: PORTABLE CHEST 1 VIEW COMPARISON:  04/01/2019. FINDINGS: Endotracheal tube, left PICC line in stable position. Cardiomegaly unchanged. Diffuse bilateral pulmonary infiltrates/edema and bilateral pleural effusions again noted, no change. Persistent basilar atelectasis. No pneumothorax. IMPRESSION: 1.  Left PICC line noted with tip over superior vena cava.  2. Cardiomegaly unchanged. Diffuse bilateral pulmonary infiltrates/edema bilateral pleural effusions again noted, no change. Persistent basilar atelectasis. Electronically Signed   By: Marcello Moores  Register   On: 04/02/2019 07:52   Dg Chest Port 1 View  Result Date: 04/01/2019 CLINICAL DATA:  Acute respiratory failure. Aspiration pneumonia. EXAM: PORTABLE CHEST 1 VIEW COMPARISON:  03/31/2019 FINDINGS: Support lines and tubes in appropriate position. Stable cardiomegaly. Diffuse bilateral airspace disease shows no significant change. Probable small bilateral pleural effusions, also stable. IMPRESSION: No significant change in diffuse bilateral airspace disease and probable bilateral pleural effusions. Stable cardiomegaly. Electronically Signed   By: Earle Gell M.D.   On: 04/01/2019 08:12   Dg Chest Port 1 View  Result Date:  03/31/2019 CLINICAL DATA:  Respiratory failure. EXAM: PORTABLE CHEST 1 VIEW COMPARISON:  03/30/2019 FINDINGS: The patient is rotated to the left, and the patient's chin partially obscures the left lung apex. An endotracheal tube remains in place terminating approximately 1.5 cm of above the carina, not significantly changed. An enteric tube courses into the abdomen with tip not imaged. A left PICC terminates over the right atrium. There is persistent extensive airspace opacity throughout both lungs, stable to minimally improved. IMPRESSION: Stable to minimally improved extensive bilateral airspace disease. Electronically Signed   By: Logan Bores M.D.   On: 03/31/2019 09:39   Dg Chest Port 1 View  Result Date: 03/30/2019 CLINICAL DATA:  Shortness of breath. EXAM: PORTABLE CHEST 1 VIEW COMPARISON:  03/27/2019 FINDINGS: The heart is enlarged but stable.  Left PICC line appears stable. Severe and progressive bilateral airspace process. Persistent small pleural effusions. No pneumothorax. IMPRESSION: Severe and progressive bilateral airspace process. Electronically Signed   By: Marijo Sanes M.D.   On: 03/30/2019 10:41   Dg Chest Port 1 View  Result Date: 03/27/2019 CLINICAL DATA:  Acute respiratory failure with hypoxia. EXAM: PORTABLE CHEST 1 VIEW COMPARISON:  One-view chest x-ray 03/26/2019 FINDINGS: Heart is enlarged. Left hemidiaphragm is elevated. NG tube is in the stomach. Left-sided PICC line terminates in the right atrium. Pulmonary vascular congestion is increased. Bilateral infiltrates have increased. IMPRESSION: 1. Increasing bilateral infiltrates, left greater than right. This is concerning for infection. 2. Elevation of left hemidiaphragm. 3. Stable cardiomegaly. 4. Left-sided PICC line terminates in the right atrium. Electronically Signed   By: San Morelle M.D.   On: 03/27/2019 08:37   Dg Chest Port 1 View  Result Date: 03/26/2019 CLINICAL DATA:  Follow-up PICC line placement EXAM:  PORTABLE CHEST 1 VIEW COMPARISON:  04/08/2019 FINDINGS: Left-sided PICC line is noted with catheter tip at the cavoatrial junction. Gastric catheter is noted coiled within the stomach. Elevation of the left hemidiaphragm is noted with some associated infiltrates bilaterally left greater than right similar to that seen on the prior exam. No bony abnormality is noted. IMPRESSION: Stable infiltrates bilaterally left greater than right. New left-sided PICC line in satisfactory position. Electronically Signed   By: Inez Catalina M.D.   On: 03/26/2019 12:46   Dg Chest Port 1 View  Result Date: 04/05/2019 CLINICAL DATA:  NG tube placement. EXAM: PORTABLE CHEST 1 VIEW COMPARISON:  04/16/2019 and CT 04/15/2019 FINDINGS: Patient is slightly rotated to the left. Nasogastric tube is coiled once over the stomach in patient's known large hiatal hernia and then proceeds inferiorly over the left upper quadrant and off the film as tip is not visualized. Non radiopaque side-port is over the stomach in the left upper abdomen. Exam demonstrates adequate lung volumes with worsening bilateral perihilar opacification  suggesting interstitial edema and less likely infection. Persistent opacification over the left base likely effusion with atelectasis. Stable mild hazy density over the right base which may represent a small effusion with atelectasis. Infection over the lung bases is possible. Stable cardiomegaly. Remainder of the exam is unchanged. IMPRESSION: Worsening bilateral perihilar opacification suggesting worsening interstitial edema. Stable bibasilar opacification likely small effusions left greater than right with associated basilar atelectasis. Infection in the mid to lower lungs is possible. Stable cardiomegaly. Nasogastric tube coiled once over the large hiatal hernia with side-port over the stomach in the left upper quadrant and tip not visualized. Electronically Signed   By: Marin Olp M.D.   On: 04/11/2019 20:28   Dg  Chest Portable 1 View  Result Date: 04/11/2019 CLINICAL DATA:  Nasogastric tube placement EXAM: PORTABLE CHEST 1 VIEW COMPARISON:  Abdominal CT from earlier today FINDINGS: Nasogastric tube which at least reaches the stomach (which is low lying and distended by CT). There is haziness at both lung bases where there is atelectasis and pleural fluid by prior CT. Cardiopericardial enlargement. IMPRESSION: 1. The nasogastric tube reaches the stomach at least. 2. Cardiomegaly with bilateral pleural effusion obscuring the lower lobes. Electronically Signed   By: Monte Fantasia M.D.   On: 04/01/2019 16:36   Dg Chest Port 1v Same Day  Result Date: 03/30/2019 CLINICAL DATA:  Status post intubation and OG tube placement today. EXAM: PORTABLE CHEST 1 VIEW COMPARISON:  Single-view of the chest earlier today. FINDINGS: Endotracheal tube tip is 1.5 cm above the carina. The tube could be withdrawn 1-2 cm for better positioning. OG tube courses into the stomach and below the inferior margin the film. Severe and diffuse bilateral airspace disease is again seen. Cardiac silhouette is obscured. No pneumothorax is identified. IMPRESSION: ETT tip is 1.5 cm above the carina. The tube could be withdrawn 1-2 cm. OG tube courses into the stomach and below the inferior margin the film. No change in diffuse bilateral airspace disease. Electronically Signed   By: Inge Rise M.D.   On: 03/30/2019 11:53   Dg Abd 2 Views  Result Date: 03/30/2019 CLINICAL DATA:  Follow up small bowel obstruction EXAM: ABDOMEN - 2 VIEW COMPARISON:  03/26/2019 FINDINGS: Stable scoliosis is noted. Bibasilar infiltrative changes are seen. A few persistent loops of dilated small bowel are seen. No free air is noted. The overall appearance has improved slightly in the interval from the prior exam. IMPRESSION: Slight improvement in mild small bowel dilatation. No new focal abnormality is seen. Electronically Signed   By: Inez Catalina M.D.   On: 03/30/2019  08:08   Dg Abd Portable 2v  Result Date: 03/26/2019 CLINICAL DATA:  Small bowel obstruction, colon resection. EXAM: PORTABLE ABDOMEN - 2 VIEW COMPARISON:  CT abdomen pelvis 03/29/2019. FINDINGS: Nasogastric tube terminates in the stomach. Persistent small bowel dilatation. No minimal colonic gas. Retained contrast in the bladder with Foley catheter in place. Postoperative changes in the left lower quadrant. Incidental imaging of the lower chest shows left lower lobe consolidation with patchy airspace opacification bilaterally. Bilateral pleural effusions are noted as well. IMPRESSION: 1. Persistent small bowel obstruction. 2. Left lower lobe consolidation with patchy bilateral airspace opacification and bilateral pleural effusions, better evaluated on chest radiograph done the same day. Electronically Signed   By: Lorin Picket M.D.   On: 03/26/2019 12:46    Barton Dubois, MD  [facility] Pager [phone]   04/03/2019, 12:03 PM   LOS: 9 days

## 2019-04-03 NOTE — Progress Notes (Signed)
Nutrition Follow-up  DOCUMENTATION CODES:  Not applicable  INTERVENTION:  To help lessen potentially excessive protein load and reduce total fluid volume, change TF to Osmolite 1.5 _0 /hr (673ms per day) and prostat 30 ml TID to provide 1200 kcals, 83 gm protein, 457 ml free water daily.   NUTRITION DIAGNOSIS:  Inadequate oral intake related to inability to eat as evidenced by NPO status.  -Ongoing  GOAL:   Oral intake to meet >90% of needs  - met with TF  MONITOR:  PO intake, Weight trends, Diet advancement, Vent status, Labs, I & O's, TF tolerance  ASSESSMENT:  85y/o female PMHx Afib, HTN, dCHF, CKD3, and extensive hx of abd surgeries, including partial colectomy. Presented to ED 6/7 with n/v, no bm x4 days (usually has BM daily) and inability to tolerate oral intake. Abd CT showed High grade partial SBO at level of mid small bowel as well as possible aspiration PNA. Pt had ngt placed and met sepsis criteria on admission.   Following up today regarding tube feeding, which was started Saturday. Pt tolerated initiation of TF well. It was gradually titrated up 10cc q 8 hrs and residuals (ordered per surgeon) never eclipsed 50 mls. She is having regular BMs.  Today, pt is alert on vent. She shakes her head "no" when asked of any n/v or abdominal discomfort. Bed weight is 50.5 kg, which is within 2 lbs of her weight on Friday. Dry weight this admission is still 108 lbs (49.1 kg)  Her BUN has slowly gone up, potentially due to high/excessive protein. Volume overload is still a concern per cardiology. Now that she is no longer hypotensive, will switch to osmolite 1.5 which will reduce protein and volume load.   Patient is currently intubated on ventilator support MV: 10.8 L/min Temp (24hrs), Avg:97.5 F (36.4 C), Min:96.8 F (36 C), Max:98 F (36.7 C) BP:107/61 (75) Propofol: None  Labs:  Bgs: 140-160, WBC: 17.8->20.6, K:3.1,  BUN: 35 on sat, -> 47-> 66 -> 75 today Meds: PPi,  IV abx, Lasix, Insulin Sedation/Analgesia: Fentanyl  Recent Labs  Lab 03/31/19 1919 04/01/19 0450 04/01/19 1711 04/02/19 0414 04/03/19 0508  NA  --  143  --  148* 148*  K  --  4.4  --  3.7 3.1*  CL  --  107  --  108 106  CO2  --  30  --  31 30  BUN  --  47*  --  66* 75*  CREATININE  --  1.01*  --  1.06* 0.98  CALCIUM  --  9.9  --  10.1 9.5  MG 2.1 2.2 2.1  --   --   PHOS 3.0 2.9 2.9  --   --   GLUCOSE  --  188*  --  164* 166*   Diet Order:   Diet Order            Diet NPO time specified Except for: Ice Chips, Sips with Meds  Diet effective now             EDUCATION NEEDS:  No education needs have been identified at this time  Skin:  Skin Assessment: Reviewed RN Assessment  Last BM:  6/15  Height:  Ht Readings from Last 1 Encounters:  04/03/19 _1  (1.626 m)   Weight:  Wt Readings from Last 1 Encounters:  04/03/19 52.5 kg   Wt Readings from Last 10 Encounters:  04/03/19 52.5 kg  07/24/14 54.9 kg   Ideal Body Weight:  54.54 kg  BMI:  Body mass index is 19.87 kg/m.  Estimated Nutritional Needs:  Kcal:  1130 kcals (PSU 2003b) Protein:  74-88g Pro (1.5- 1.8g/kg bw) Fluid:  Defer to MD  Burtis Junes RD, LDN, CNSC Clinical Nutrition Available Tues-Sat via Pager: [phone] 04/03/2019 12:01 PM

## 2019-04-03 NOTE — Progress Notes (Signed)
Progress Note  Patient Name: Carrie Morales Date of Encounter: 04/03/2019  Primary Cardiologist: Dr Darral Dash  Subjective   No events overnight. Making progress with vent weaning  Inpatient Medications    Scheduled Meds: . chlorhexidine gluconate (MEDLINE KIT)  15 mL Mouth Rinse BID  . Chlorhexidine Gluconate Cloth  6 each Topical Daily  . feeding supplement (VITAL HIGH PROTEIN)  1,000 mL Per Tube Q24H  . furosemide  60 mg Intravenous BID  . insulin aspart  5 Units Subcutaneous Q4H  . mouth rinse  15 mL Mouth Rinse 10 times per day  . methylPREDNISolone (SOLU-MEDROL) injection  40 mg Intravenous Q12H  . metoprolol tartrate  5 mg Intravenous Q8H  . pantoprazole (PROTONIX) IV  40 mg Intravenous Q24H  . sodium chloride flush  10-40 mL Intracatheter Q12H  . sodium chloride flush  3 mL Intravenous Q12H   Continuous Infusions: . sodium chloride 10 mL/hr at 03/31/19 0600  . amiodarone 30 mg/hr (04/03/19 0400)  . fentaNYL infusion INTRAVENOUS 75 mcg/hr (04/03/19 0400)  . heparin 1,300 Units/hr (04/03/19 0400)  . meropenem (MERREM) IV 1,000 mg (04/03/19 0815)   PRN Meds: sodium chloride, acetaminophen **OR** acetaminophen, albuterol, bisacodyl, fentaNYL, midazolam, midazolam, ondansetron **OR** ondansetron (ZOFRAN) IV, phenol, polyethylene glycol, sodium chloride flush, sodium chloride flush, traZODone   Vital Signs    Vitals:   04/03/19 0738 04/03/19 0800 04/03/19 0801 04/03/19 0846  BP:  120/62    Pulse:      Resp: (!) 24 (!) 22    Temp: (!) 96.8 F (36 C)     TempSrc: Axillary     SpO2: 91% (!) 88% 91% 95%  Weight:      Height:    _0  (1.626 m)    Intake/Output Summary (Last 24 hours) at 04/03/2019 0943 Last data filed at 04/03/2019 0819 Gross per 24 hour  Intake 1593.95 ml  Output 2825 ml  Net -1231.05 ml   Last 3 Weights 04/03/2019 04/02/2019 04/01/2019  Weight (lbs) 115 lb 11.9 oz 117 lb 11.6 oz 113 lb 1.5 oz  Weight (kg) 52.5 kg 53.4 kg 51.3 kg       Telemetry    afib rates 80s to 110s - Personally Reviewed  ECG    na - Personally Reviewed  Physical Exam   GEN: No acute distress.   Neck: No JVD Cardiac: irreg, 2/6 systolic murmur rusb Respiratory: Clear to auscultation bilaterally. GI: Soft, nontender, non-distended  MS: No edema; No deformity. Neuro:  Nonfocal  Psych: Normal affect   Labs    Chemistry Recent Labs  Lab 03/28/19 0425  04/01/19 0450 04/02/19 0414 04/03/19 0508  NA 139   < > 143 148* 148*  K 4.3   < > 4.4 3.7 3.1*  CL 107   < > 107 108 106  CO2 24   < > _1 GLUCOSE 132*   < > 188* 164* 166*  BUN 44*   < > 47* 66* 75*  CREATININE 1.14*   < > 1.01* 1.06* 0.98  CALCIUM 9.4   < > 9.9 10.1 9.5  PROT 5.2*  --   --   --   --   ALBUMIN 2.4*  --   --   --   --   AST 16  --   --   --   --   ALT 16  --   --   --   --   ALKPHOS 53  --   --   --   --  BILITOT 1.2  --   --   --   --   GFRNONAA 43*   < > 50* 47* 52*  GFRAA 50*   < > 58* 54* 60*  ANIONGAP 8   < > _0 < > = values in this interval not displayed.     Hematology Recent Labs  Lab 03/31/19 0217 04/01/19 0450 04/03/19 0508  WBC 14.9* 18.0* 20.6*  RBC 3.66* 4.23 4.24  HGB 11.0* 13.0 12.6  HCT 36.8 41.5 40.5  MCV 100.5* 98.1 95.5  MCH 30.1 30.7 29.7  MCHC 29.9* 31.3 31.1  RDW 14.5 14.4 14.6  PLT 168 220 228    Cardiac EnzymesNo results for input(s): TROPONINI in the last 168 hours. No results for input(s): TROPIPOC in the last 168 hours.   BNPNo results for input(s): BNP, PROBNP in the last 168 hours.   DDimer No results for input(s): DDIMER in the last 168 hours.   Radiology    Dg Chest Port 1 View  Result Date: 04/02/2019 CLINICAL DATA:  Respiratory failure. EXAM: PORTABLE CHEST 1 VIEW COMPARISON:  04/01/2019. FINDINGS: Endotracheal tube, left PICC line in stable position. Cardiomegaly unchanged. Diffuse bilateral pulmonary infiltrates/edema and bilateral pleural effusions again noted, no change. Persistent basilar  atelectasis. No pneumothorax. IMPRESSION: 1.  Left PICC line noted with tip over superior vena cava. 2. Cardiomegaly unchanged. Diffuse bilateral pulmonary infiltrates/edema bilateral pleural effusions again noted, no change. Persistent basilar atelectasis. Electronically Signed   By: Marcello Moores  Register   On: 04/02/2019 07:52    Cardiac Studies    Patient Profile     85 y.o.femalewith a history of permanent atrial fibrillation diagnosed in December 2019 (also intermittent atrial flutter based on records) andhypertension admitted06/07with small bowel obstruction and sepsis secondary to pneumonia.   Assessment & Plan    1. Permanent afib/intermittent aflutter - issues with tachycardia in setting of severe systemic illness - low bp's on dilt gtt, changed to amio for rate control - she is on hep gtt  - rates have done well on amio. Once extubated and taking oral regimen would restart her home lopressor, unclear if would restart digoxin given advanced age and some renal dysfunction. Would not commit to long term amio    2. Acute diastolic HF - 04/6810 echo: LVEF >65%, indeterminate diastolic function - she is on lasix IV 97m bid. Negative 1.2 L yesterday. Cr trending down, BUN trending up.  CVP of 10 in intubated patient is fairly reasonable  - continue IV diuretics, would look to keep mildly negative each day.    3. Heart murmur secondary to hyperdynamic LVEF with small LVOT and turbulent flow associated with anterior systolic motion of the mitral valve. - see echo report above - no additional eval needed at this time - need to watch volume  4. SBO - per primary team  5. Sepsis with pneumonia and respiratory failure with ARDS - per primary team and pulmonary - remains intubated, on ARDS protocol  - from pulm notes doing well with vent weaning, down to 40% FiO2.   6. Incidentally noted coronary and aortic calcifications by CT imaging. - no further eval at this time,  f/u as outpt  7. Moderate pericardial effusion - noted on echo 03/2019, no evidence of tamponade - repeat echo in next few days  8. Mitral regurgitation - moderate by echo      For questions or updates, please contact CArctic VillagePlease consult [email or web address] for contact info under  Merrily Pew, MD  04/03/2019, 9:43 AM

## 2019-04-03 NOTE — Progress Notes (Signed)
ANTICOAGULATION CONSULT NOTE - Follow Up Consult  Pharmacy Consult for heparin Indication: atrial fibrillation  HEPARIN DW (KG): 47.6   Labs: Recent Labs    04/01/19 0450  04/02/19 0414 04/03/19 0508 04/03/19 0822  HGB 13.0  --   --  12.6  --   HCT 41.5  --   --  40.5  --   PLT 220  --   --  228  --   HEPARINUNFRC 0.14*   < > 0.67 1.32* 1.32*  CREATININE 1.01*  --  1.06* 0.98  --    < > = values in this interval not displayed.    Assessment: 85yo female with history of atrial fibrillation. Heparin is supratherapeutic now at 1.32 and had been therapeutic for ~48 hours. Had HL redrawn to confirm; discussed with RN and she did the blood draw from PICC line on right arm and heparin infusing on left arm. Will adjust infusion rate.  Goal of Therapy:  Heparin level 0.3-0.7 units/ml  Monitor platelets per protocol   Plan:  Hold heparin for 1 hour, then decrease heparin gtt to 1150 units/hr and check level in ~8 hrs and every am.  Monitor labs and s/s of bleeding     Isac Sarna, BS [place], [place] Clinical Pharmacist Pager [phone] 04/03/2019 9:21 AM

## 2019-04-03 NOTE — Progress Notes (Signed)
Subjective: She is awake able to answer questions on the ventilator still with some sedation.  She has been able to go down to 40% oxygen.  Chest x-ray is pending.  Objective: Vital signs in last 24 hours: Temp:  [96.8 F (36 C)-98.5 F (36.9 C)] 96.8 F (36 C) (06/16 0738) Pulse Rate:  [77-110] 110 (06/15 1830) Resp:  [20-32] 24 (06/16 0738) BP: (91-172)/(47-88) 105/58 (06/16 0730) SpO2:  [87 %-100 %] 91 % (06/16 0738) FiO2 (%):  [45 %-70 %] 45 % (06/16 0443) Weight:  [52.5 kg] 52.5 kg (06/16 0500) Weight change: -0.9 kg Last BM Date: 04/02/19  Intake/Output from previous day: 06/15 0701 - 06/16 0700 In: 1571 [I.V.:846; NG/GT:525; IV Piggyback:200] Out: 2825 [Urine:2825]  PHYSICAL EXAM General appearance: alert, cooperative, moderate distress and Intubated and on mechanical ventilation Resp: rales bilaterally and rhonchi bilaterally Cardio: Heart is still irregular rate is better controlled in the 80s GI: soft, non-tender; bowel sounds normal; no masses,  no organomegaly Extremities: extremities normal, atraumatic, no cyanosis or edema  Lab Results:  Results for orders placed or performed during the hospital encounter of 04/06/2019 (from the past 48 hour(s))  Glucose, capillary     Status: Abnormal   Collection Time: 04/01/19 11:35 AM  Result Value Ref Range   Glucose-Capillary 175 (H) 70 - 99 mg/dL  Heparin level (unfractionated)     Status: None   Collection Time: 04/01/19  1:50 PM  Result Value Ref Range   Heparin Unfractionated 0.53 0.30 - 0.70 IU/mL    Comment: (NOTE) If heparin results are below expected values, and patient dosage has  been confirmed, suggest follow up testing of antithrombin III levels. Performed at [facility], [address], [place], [place] [address]   Glucose, capillary     Status: Abnormal   Collection Time: 04/01/19  4:09 PM  Result Value Ref Range   Glucose-Capillary 182 (H) 70 - 99 mg/dL  Magnesium     Status: None   Collection Time:  04/01/19  5:11 PM  Result Value Ref Range   Magnesium 2.1 1.7 - 2.4 mg/dL    Comment: Performed at [facility], [address], [place], [place] [address]  Phosphorus     Status: None   Collection Time: 04/01/19  5:11 PM  Result Value Ref Range   Phosphorus 2.9 2.5 - 4.6 mg/dL    Comment: Performed at [facility], [address], [place], [place] [address]  Glucose, capillary     Status: Abnormal   Collection Time: 04/01/19  7:28 PM  Result Value Ref Range   Glucose-Capillary 182 (H) 70 - 99 mg/dL  Glucose, capillary     Status: Abnormal   Collection Time: 04/01/19  9:32 PM  Result Value Ref Range   Glucose-Capillary 165 (H) 70 - 99 mg/dL  Glucose, capillary     Status: Abnormal   Collection Time: 04/02/19  1:03 AM  Result Value Ref Range   Glucose-Capillary 148 (H) 70 - 99 mg/dL  Blood gas, arterial     Status: Abnormal   Collection Time: 04/02/19  4:00 AM  Result Value Ref Range   FIO2 70.00    pH, Arterial 7.355 7.350 - 7.450   pCO2 arterial 56.7 (H) 32.0 - 48.0 mmHg   pO2, Arterial 67.1 (L) 83.0 - 108.0 mmHg   Bicarbonate 28.2 (H) 20.0 - 28.0 mmol/L   Acid-Base Excess 5.5 (H) 0.0 - 2.0 mmol/L   O2 Saturation 91.5 %   Patient temperature 37.0  Allens test (pass/fail) PASS PASS    Comment: Performed at [facility], [address] [place], [place] [address]  Basic metabolic panel     Status: Abnormal   Collection Time: 04/02/19  4:14 AM  Result Value Ref Range   Sodium 148 (H) 135 - 145 mmol/L   Potassium 3.7 3.5 - 5.1 mmol/L   Chloride 108 98 - 111 mmol/L   CO2 31 22 - 32 mmol/L   Glucose, Bld 164 (H) 70 - 99 mg/dL   BUN 66 (H) 8 - 23 mg/dL   Creatinine, Ser 1.06 (H) 0.44 - 1.00 mg/dL   Calcium 10.1 8.9 - 10.3 mg/dL   GFR calc non Af Amer 47 (L) >60 mL/min   GFR calc Af Amer 54 (L) >60 mL/min   Anion gap 9 5 - 15    Comment: Performed at [facility], [address], [place], [place] [address]  Heparin level (unfractionated)     Status: None   Collection Time:  04/02/19  4:14 AM  Result Value Ref Range   Heparin Unfractionated 0.67 0.30 - 0.70 IU/mL    Comment: (NOTE) If heparin results are below expected values, and patient dosage has  been confirmed, suggest follow up testing of antithrombin III levels. Performed at [facility], [address], [place], [place] [address]   Glucose, capillary     Status: Abnormal   Collection Time: 04/02/19  5:26 AM  Result Value Ref Range   Glucose-Capillary 156 (H) 70 - 99 mg/dL  Glucose, capillary     Status: Abnormal   Collection Time: 04/02/19  7:46 AM  Result Value Ref Range   Glucose-Capillary 150 (H) 70 - 99 mg/dL  Glucose, capillary     Status: Abnormal   Collection Time: 04/02/19 11:45 AM  Result Value Ref Range   Glucose-Capillary 140 (H) 70 - 99 mg/dL  Glucose, capillary     Status: Abnormal   Collection Time: 04/02/19  4:10 PM  Result Value Ref Range   Glucose-Capillary 160 (H) 70 - 99 mg/dL  Glucose, capillary     Status: Abnormal   Collection Time: 04/02/19  8:28 PM  Result Value Ref Range   Glucose-Capillary 160 (H) 70 - 99 mg/dL  Glucose, capillary     Status: Abnormal   Collection Time: 04/02/19 11:59 PM  Result Value Ref Range   Glucose-Capillary 152 (H) 70 - 99 mg/dL  Glucose, capillary     Status: Abnormal   Collection Time: 04/03/19  4:07 AM  Result Value Ref Range   Glucose-Capillary 148 (H) 70 - 99 mg/dL  Blood gas, arterial     Status: Abnormal   Collection Time: 04/03/19  4:55 AM  Result Value Ref Range   FIO2 40.00    Delivery systems VENTILATOR    Mode PRESSURE REGULATED VOLUME CONTROL    VT 430 mL   LHR 24 resp/min   Peep/cpap 5.0 cm H20   pH, Arterial 7.425 7.350 - 7.450   pCO2 arterial 53.5 (H) 32.0 - 48.0 mmHg   pO2, Arterial 56.2 (L) 83.0 - 108.0 mmHg   Bicarbonate 32.4 (H) 20.0 - 28.0 mmol/L   Acid-Base Excess 9.8 (H) 0.0 - 2.0 mmol/L   O2 Saturation 87.9 %   Patient temperature 37.0    Collection site RIGHT RADIAL    Drawn by [id number]    Allens test  (pass/fail) PASS PASS    Comment: Performed at [facility], [address] [place], [place] [address]  Basic metabolic  panel     Status: Abnormal   Collection Time: 04/03/19  5:08 AM  Result Value Ref Range   Sodium 148 (H) 135 - 145 mmol/L   Potassium 3.1 (L) 3.5 - 5.1 mmol/L   Chloride 106 98 - 111 mmol/L   CO2 30 22 - 32 mmol/L   Glucose, Bld 166 (H) 70 - 99 mg/dL   BUN 75 (H) 8 - 23 mg/dL   Creatinine, Ser 0.98 0.44 - 1.00 mg/dL   Calcium 9.5 8.9 - 10.3 mg/dL   GFR calc non Af Amer 52 (L) >60 mL/min   GFR calc Af Amer 60 (L) >60 mL/min   Anion gap 12 5 - 15    Comment: Performed at [facility], [address] [place], [place] [address]  Heparin level (unfractionated)     Status: Abnormal   Collection Time: 04/03/19  5:08 AM  Result Value Ref Range   Heparin Unfractionated 1.32 (H) 0.30 - 0.70 IU/mL    Comment: RESULTS CONFIRMED BY MANUAL DILUTION (NOTE) If heparin results are below expected values, and patient dosage has  been confirmed, suggest follow up testing of antithrombin III levels. Performed at [facility], [address], [place], [place] [address]   CBC     Status: Abnormal   Collection Time: 04/03/19  5:08 AM  Result Value Ref Range   WBC 20.6 (H) 4.0 - 10.5 K/uL   RBC 4.24 3.87 - 5.11 MIL/uL   Hemoglobin 12.6 12.0 - 15.0 g/dL   HCT 40.5 36.0 - 46.0 %   MCV 95.5 80.0 - 100.0 fL   MCH 29.7 26.0 - 34.0 pg   MCHC 31.1 30.0 - 36.0 g/dL   RDW 14.6 11.5 - 15.5 %   Platelets 228 150 - 400 K/uL   nRBC 0.0 0.0 - 0.2 %    Comment: Performed at [facility], [address] [place], [place] [address]  Glucose, capillary     Status: Abnormal   Collection Time: 04/03/19  7:35 AM  Result Value Ref Range   Glucose-Capillary 143 (H) 70 - 99 mg/dL    ABGS Recent Labs    04/03/19 0455  PHART 7.425  PO2ART 56.2*  HCO3 32.4*   CULTURES Recent Results (from the past 240 hour(s))  SARS Coronavirus 2 (CEPHEID - Performed in [place] hospital lab), Hosp Order      Status: None   Collection Time: 03/20/2019  2:26 PM   Specimen: Nasopharyngeal Swab  Result Value Ref Range Status   SARS Coronavirus 2 NEGATIVE NEGATIVE Final    Comment: (NOTE) If result is NEGATIVE SARS-CoV-2 target nucleic acids are NOT DETECTED. The SARS-CoV-2 RNA is generally detectable in upper and lower  respiratory specimens during the acute phase of infection. The lowest  concentration of SARS-CoV-2 viral copies this assay can detect is 250  copies / mL. A negative result does not preclude SARS-CoV-2 infection  and should not be used as the sole basis for treatment or other  patient management decisions.  A negative result may occur with  improper specimen collection / handling, submission of specimen other  than nasopharyngeal swab, presence of viral mutation(s) within the  areas targeted by this assay, and inadequate number of viral copies  (<250 copies / mL). A negative result must be combined with clinical  observations, patient history, and epidemiological information. If result is POSITIVE SARS-CoV-2 target nucleic acids are DETECTED. The SARS-CoV-2 RNA is generally detectable in upper and lower  respiratory specimens dur ing  the acute phase of infection.  Positive  results are indicative of active infection with SARS-CoV-2.  Clinical  correlation with patient history and other diagnostic information is  necessary to determine patient infection status.  Positive results do  not rule out bacterial infection or co-infection with other viruses. If result is PRESUMPTIVE POSTIVE SARS-CoV-2 nucleic acids MAY BE PRESENT.   A presumptive positive result was obtained on the submitted specimen  and confirmed on repeat testing.  While 2019 novel coronavirus  (SARS-CoV-2) nucleic acids may be present in the submitted sample  additional confirmatory testing may be necessary for epidemiological  and / or clinical management purposes  to differentiate between  SARS-CoV-2 and other  Sarbecovirus currently known to infect humans.  If clinically indicated additional testing with an alternate test  methodology [phone]) is advised. The SARS-CoV-2 RNA is generally  detectable in upper and lower respiratory sp ecimens during the acute  phase of infection. The expected result is Negative. Fact Sheet for Patients:  StrictlyIdeas.no Fact Sheet for Healthcare Providers: BankingDealers.co.za This test is not yet approved or cleared by the [place] FDA and has been authorized for detection and/or diagnosis of SARS-CoV-2 by FDA under an Emergency Use Authorization (EUA).  This EUA will remain in effect (meaning this test can be used) for the duration of the COVID-19 declaration under Section 564(b)(1) of the Act, 21 U.S.C. section 360bbb-3(b)(1), unless the authorization is terminated or revoked sooner. Performed at [facility], [address] [place], [place] [address]   MRSA PCR Screening     Status: None   Collection Time: 03/30/2019  7:54 PM   Specimen: Nasal Mucosa; Nasopharyngeal  Result Value Ref Range Status   MRSA by PCR NEGATIVE NEGATIVE Final    Comment:        The GeneXpert MRSA Assay (FDA approved for NASAL specimens only), is one component of a comprehensive MRSA colonization surveillance program. It is not intended to diagnose MRSA infection nor to guide or monitor treatment for MRSA infections. Performed at [facility], [address] [place], [place] [address]   Culture, blood (Routine X 2) w Reflex to ID Panel     Status: None   Collection Time: 03/29/2019  9:50 PM   Specimen: Left Antecubital; Blood  Result Value Ref Range Status   Specimen Description LEFT ANTECUBITAL  Final   Special Requests   Final    BOTTLES DRAWN AEROBIC AND ANAEROBIC Blood Culture adequate volume   Culture   Final    NO GROWTH 5 DAYS Performed at [facility], [address], [place], [place] [address]    Report Status  03/30/2019 FINAL  Final   Studies/Results: Dg Chest Port 1 View  Result Date: 04/02/2019 CLINICAL DATA:  Respiratory failure. EXAM: PORTABLE CHEST 1 VIEW COMPARISON:  04/01/2019. FINDINGS: Endotracheal tube, left PICC line in stable position. Cardiomegaly unchanged. Diffuse bilateral pulmonary infiltrates/edema and bilateral pleural effusions again noted, no change. Persistent basilar atelectasis. No pneumothorax. IMPRESSION: 1.  Left PICC line noted with tip over superior vena cava. 2. Cardiomegaly unchanged. Diffuse bilateral pulmonary infiltrates/edema bilateral pleural effusions again noted, no change. Persistent basilar atelectasis. Electronically Signed   By: Marcello Moores  Register   On: 04/02/2019 07:52    Medications:  Prior to Admission:  Medications Prior to Admission  Medication Sig Dispense Refill Last Dose  . apixaban (ELIQUIS) 2.5 MG TABS tablet Take 2.5 mg by mouth 2 (two) times daily.    03/24/2019 at Unknown time  . aspirin 81  MG tablet Take 81 mg by mouth daily.   unknown  . Calcium Carbonate-Vitamin D (CALCIUM + D PO) Take 1 tablet by mouth daily.    unknown  . digoxin (LANOXIN) 0.125 MG tablet Take 0.125 mg by mouth daily.   03/24/2019 at Unknown time  . furosemide (LASIX) 20 MG tablet Take 20 mg by mouth See admin instructions. Take one tablet by mouth daily 5 days per week   03/23/2019 at Unknown time  . losartan (COZAAR) 50 MG tablet Take 50 mg by mouth daily.    03/24/2019 at Unknown time  . metoprolol tartrate (LOPRESSOR) 100 MG tablet Take 100 mg by mouth 2 (two) times daily.    03/24/2019 at Unknown time  . Multiple Vitamin (MULTIVITAMIN) tablet Take 1 tablet by mouth daily.   03/24/2019  . ondansetron (ZOFRAN) 4 MG tablet Take 4 mg by mouth daily as needed for nausea or vomiting.   03/24/2019 at Unknown time  . potassium chloride (K-DUR) 10 MEQ tablet Take 10 mEq by mouth See admin instructions. Take one tablet daily by mouth 5 days per week   03/23/2019 at Unknown time  . Cholecalciferol  (VITAMIN D PO) Take 1 capsule by mouth daily.    03/24/2019   Scheduled: . chlorhexidine gluconate (MEDLINE KIT)  15 mL Mouth Rinse BID  . Chlorhexidine Gluconate Cloth  6 each Topical Daily  . feeding supplement (VITAL HIGH PROTEIN)  1,000 mL Per Tube Q24H  . furosemide  60 mg Intravenous BID  . insulin aspart  5 Units Subcutaneous Q4H  . mouth rinse  15 mL Mouth Rinse 10 times per day  . methylPREDNISolone (SOLU-MEDROL) injection  40 mg Intravenous Q12H  . metoprolol tartrate  5 mg Intravenous Q8H  . pantoprazole (PROTONIX) IV  40 mg Intravenous Q24H  . sodium chloride flush  10-40 mL Intracatheter Q12H  . sodium chloride flush  3 mL Intravenous Q12H   Continuous: . sodium chloride 10 mL/hr at 03/31/19 0600  . amiodarone 30 mg/hr (04/03/19 0400)  . fentaNYL infusion INTRAVENOUS 75 mcg/hr (04/03/19 0400)  . heparin 1,300 Units/hr (04/03/19 0400)  . meropenem [facility]) IV Stopped (04/02/19 2114)   HUT:MLYYTK chloride, acetaminophen **OR** acetaminophen, albuterol, bisacodyl, fentaNYL, midazolam, midazolam, ondansetron **OR** ondansetron (ZOFRAN) IV, phenol, polyethylene glycol, sodium chloride flush, sodium chloride flush, traZODone  Assesment: She was admitted with small bowel obstruction and had pneumonia presumably from aspiration.  She had been improving then had fairly rapid deterioration of her respiratory status and developed ARDS.  She was intubated and placed on the ventilator.  Her oxygenation has improved markedly and she has been switched now to standard mode of ventilation rather than ARDS.  She is down to 40% oxygen and her PO2 is marginal on blood gas but her oxygen saturation is 93-95.  Chest x-ray is pending  She has atrial fib and had rapid ventricular response and she is doing okay now on amiodarone and heparin  She had acute diastolic heart failure and is on Lasix.  Her blood pressure has improved which had been marginal so Lasix was increased yesterday and she is -1200  yesterday.  Her small bowel obstruction has resolved and she is tolerating tube feedings thus far.  Blood pressures were soft on diltiazem but blood pressures are much better now  Principal Problem:   SBO (small bowel obstruction) (HCC) Active Problems:   Essential hypertension   Chronic a-fib   PNA (pneumonia)--Aspiration   Respiratory failure (HCC)   Acute diastolic CHF (  congestive heart failure) (HCC)   Atrial fibrillation with RVR (HCC)   Elevated troponin   Lobar pneumonia (HCC)   Coronary artery disease due to lipid rich plaque    Plan: Continue treatments.  Chest x-ray today continue ventilator support.  She has ventilator settings that would indicate that she could wean and I think she can see what she does breathing on her own but I doubt she will extubate today    LOS: 9 days   Carrie Morales 04/03/2019, 8:03 AM

## 2019-04-04 ENCOUNTER — Inpatient Hospital Stay (HOSPITAL_COMMUNITY): Payer: Medicare Other

## 2019-04-04 LAB — BLOOD GAS, ARTERIAL
Acid-Base Excess: 11.6 mmol/L — ABNORMAL HIGH (ref 0.0–2.0)
Bicarbonate: 34.6 mmol/L — ABNORMAL HIGH (ref 20.0–28.0)
Drawn by: 317771
FIO2: 50
MECHVT: 430 mL
O2 Saturation: 92.1 %
PEEP: 5 cmH2O
Patient temperature: 37
RATE: 24 resp/min
pCO2 arterial: 46.7 mmHg (ref 32.0–48.0)
pH, Arterial: 7.495 — ABNORMAL HIGH (ref 7.350–7.450)
pO2, Arterial: 62.4 mmHg — ABNORMAL LOW (ref 83.0–108.0)

## 2019-04-04 LAB — HEPARIN LEVEL (UNFRACTIONATED)
Heparin Unfractionated: 0.47 IU/mL (ref 0.30–0.70)
Heparin Unfractionated: 2.2 IU/mL — ABNORMAL HIGH (ref 0.30–0.70)

## 2019-04-04 LAB — GLUCOSE, CAPILLARY
Glucose-Capillary: 109 mg/dL — ABNORMAL HIGH (ref 70–99)
Glucose-Capillary: 121 mg/dL — ABNORMAL HIGH (ref 70–99)
Glucose-Capillary: 123 mg/dL — ABNORMAL HIGH (ref 70–99)
Glucose-Capillary: 124 mg/dL — ABNORMAL HIGH (ref 70–99)
Glucose-Capillary: 129 mg/dL — ABNORMAL HIGH (ref 70–99)
Glucose-Capillary: 204 mg/dL — ABNORMAL HIGH (ref 70–99)

## 2019-04-04 LAB — CBC
HCT: 38.7 % (ref 36.0–46.0)
Hemoglobin: 11.9 g/dL — ABNORMAL LOW (ref 12.0–15.0)
MCH: 29.5 pg (ref 26.0–34.0)
MCHC: 30.7 g/dL (ref 30.0–36.0)
MCV: 95.8 fL (ref 80.0–100.0)
Platelets: 211 10*3/uL (ref 150–400)
RBC: 4.04 MIL/uL (ref 3.87–5.11)
RDW: 14.5 % (ref 11.5–15.5)
WBC: 27.6 10*3/uL — ABNORMAL HIGH (ref 4.0–10.5)
nRBC: 0 % (ref 0.0–0.2)

## 2019-04-04 LAB — HEPATIC FUNCTION PANEL
ALT: 93 U/L — ABNORMAL HIGH (ref 0–44)
AST: 50 U/L — ABNORMAL HIGH (ref 15–41)
Albumin: 1.9 g/dL — ABNORMAL LOW (ref 3.5–5.0)
Alkaline Phosphatase: 76 U/L (ref 38–126)
Bilirubin, Direct: 0.1 mg/dL (ref 0.0–0.2)
Indirect Bilirubin: 0.4 mg/dL (ref 0.3–0.9)
Total Bilirubin: 0.5 mg/dL (ref 0.3–1.2)
Total Protein: 4.6 g/dL — ABNORMAL LOW (ref 6.5–8.1)

## 2019-04-04 LAB — BASIC METABOLIC PANEL
Anion gap: 16 — ABNORMAL HIGH (ref 5–15)
BUN: 80 mg/dL — ABNORMAL HIGH (ref 8–23)
CO2: 28 mmol/L (ref 22–32)
Calcium: 9.1 mg/dL (ref 8.9–10.3)
Chloride: 105 mmol/L (ref 98–111)
Creatinine, Ser: 1.01 mg/dL — ABNORMAL HIGH (ref 0.44–1.00)
GFR calc Af Amer: 58 mL/min — ABNORMAL LOW (ref >60)
GFR calc non Af Amer: 50 mL/min — ABNORMAL LOW (ref >60)
Glucose, Bld: 196 mg/dL — ABNORMAL HIGH (ref 70–99)
Potassium: 3.3 mmol/L — ABNORMAL LOW (ref 3.5–5.1)
Sodium: 149 mmol/L — ABNORMAL HIGH (ref 135–145)

## 2019-04-04 LAB — MAGNESIUM: Magnesium: 2 mg/dL (ref 1.7–2.4)

## 2019-04-04 MED ORDER — HEPARIN (PORCINE) 25000 UT/250ML-% IV SOLN
1250.0000 [IU]/h | INTRAVENOUS | Status: DC
  Administered 2019-04-04: 700 [IU]/h via INTRAVENOUS
  Administered 2019-04-05 – 2019-04-07 (×3): 850 [IU]/h via INTRAVENOUS
  Administered 2019-04-08: 21:00:00 900 [IU]/h via INTRAVENOUS
  Filled 2019-04-04 (×5): qty 250

## 2019-04-04 MED ORDER — METOPROLOL TARTRATE 25 MG PO TABS
25.0000 mg | ORAL_TABLET | Freq: Four times a day (QID) | ORAL | Status: DC
  Administered 2019-04-04 – 2019-04-09 (×17): 25 mg via ORAL
  Filled 2019-04-04 (×18): qty 1

## 2019-04-04 MED ORDER — POTASSIUM CHLORIDE 20 MEQ/15ML (10%) PO SOLN
40.0000 meq | Freq: Once | ORAL | Status: AC
  Administered 2019-04-04: 10:00:00 40 meq via ORAL
  Filled 2019-04-04: qty 30

## 2019-04-04 MED ORDER — SODIUM CHLORIDE 0.9 % IV SOLN
1000.0000 mg | Freq: Two times a day (BID) | INTRAVENOUS | Status: AC
  Administered 2019-04-04 (×2): 1000 mg via INTRAVENOUS
  Filled 2019-04-04 (×2): qty 1

## 2019-04-04 MED ORDER — LEVALBUTEROL HCL 0.63 MG/3ML IN NEBU
0.6300 mg | INHALATION_SOLUTION | Freq: Three times a day (TID) | RESPIRATORY_TRACT | Status: DC
  Administered 2019-04-04 – 2019-04-10 (×19): 0.63 mg via RESPIRATORY_TRACT
  Filled 2019-04-04 (×19): qty 3

## 2019-04-04 NOTE — Progress Notes (Signed)
Subjective: She remains intubated and on the ventilator.  We had to go up to 50%.  She is awake and alert.  No complaints.  Objective: Vital signs in last 24 hours: Temp:  [96.2 F (35.7 C)-99.1 F (37.3 C)] 99 F (37.2 C) (06/17 0800) Resp:  [19-26] 24 (06/17 0800) BP: (88-155)/(49-91) 102/57 (06/17 0800) SpO2:  [89 %-99 %] 90 % (06/17 0800) FiO2 (%):  [50 %] 50 % (06/17 0429) Weight:  [49.6 kg] 49.6 kg (06/17 0500) Weight change: -2.9 kg Last BM Date: 04/03/19  Intake/Output from previous day: 06/16 0701 - 06/17 0700 In: 1315.5 [I.V.:790.1; NG/GT:325.4; IV Piggyback:200] Out: 2075 [Urine:2075]  PHYSICAL EXAM General appearance: alert, cooperative and Intubated on the ventilator but awake Resp: rales bilaterally and rhonchi bilaterally Cardio: irregularly irregular rhythm GI: soft, non-tender; bowel sounds normal; no masses,  no organomegaly Extremities: She has a little bit of third spacing of fluid  Lab Results:  Results for orders placed or performed during the hospital encounter of 04/11/2019 (from the past 48 hour(s))  Glucose, capillary     Status: Abnormal   Collection Time: 04/02/19 11:45 AM  Result Value Ref Range   Glucose-Capillary 140 (H) 70 - 99 mg/dL  Glucose, capillary     Status: Abnormal   Collection Time: 04/02/19  4:10 PM  Result Value Ref Range   Glucose-Capillary 160 (H) 70 - 99 mg/dL  Glucose, capillary     Status: Abnormal   Collection Time: 04/02/19  8:28 PM  Result Value Ref Range   Glucose-Capillary 160 (H) 70 - 99 mg/dL  Glucose, capillary     Status: Abnormal   Collection Time: 04/02/19 11:59 PM  Result Value Ref Range   Glucose-Capillary 152 (H) 70 - 99 mg/dL  Glucose, capillary     Status: Abnormal   Collection Time: 04/03/19  4:07 AM  Result Value Ref Range   Glucose-Capillary 148 (H) 70 - 99 mg/dL  Blood gas, arterial     Status: Abnormal   Collection Time: 04/03/19  4:55 AM  Result Value Ref Range   FIO2 40.00    Delivery systems  VENTILATOR    Mode PRESSURE REGULATED VOLUME CONTROL    VT 430 mL   LHR 24 resp/min   Peep/cpap 5.0 cm H20   pH, Arterial 7.425 7.350 - 7.450   pCO2 arterial 53.5 (H) 32.0 - 48.0 mmHg   pO2, Arterial 56.2 (L) 83.0 - 108.0 mmHg   Bicarbonate 32.4 (H) 20.0 - 28.0 mmol/L   Acid-Base Excess 9.8 (H) 0.0 - 2.0 mmol/L   O2 Saturation 87.9 %   Patient temperature 37.0    Collection site RIGHT RADIAL    Drawn by [id number]    Allens test (pass/fail) PASS PASS    Comment: Performed at [facility], [address], [place], [place] [address]  Basic metabolic panel     Status: Abnormal   Collection Time: 04/03/19  5:08 AM  Result Value Ref Range   Sodium 148 (H) 135 - 145 mmol/L   Potassium 3.1 (L) 3.5 - 5.1 mmol/L   Chloride 106 98 - 111 mmol/L   CO2 30 22 - 32 mmol/L   Glucose, Bld 166 (H) 70 - 99 mg/dL   BUN 75 (H) 8 - 23 mg/dL   Creatinine, Ser 0.98 0.44 - 1.00 mg/dL   Calcium 9.5 8.9 - 10.3 mg/dL   GFR calc non Af Amer 52 (L) >60 mL/min   GFR calc Af Amer 60 (L) >60 mL/min  Anion gap 12 5 - 15    Comment: Performed at [facility], [address] [place], [place] [address]  Heparin level (unfractionated)     Status: Abnormal   Collection Time: 04/03/19  5:08 AM  Result Value Ref Range   Heparin Unfractionated 1.32 (H) 0.30 - 0.70 IU/mL    Comment: RESULTS CONFIRMED BY MANUAL DILUTION (NOTE) If heparin results are below expected values, and patient dosage has  been confirmed, suggest follow up testing of antithrombin III levels. Performed at [facility], [address], [place], [place] [address]   CBC     Status: Abnormal   Collection Time: 04/03/19  5:08 AM  Result Value Ref Range   WBC 20.6 (H) 4.0 - 10.5 K/uL   RBC 4.24 3.87 - 5.11 MIL/uL   Hemoglobin 12.6 12.0 - 15.0 g/dL   HCT 40.5 36.0 - 46.0 %   MCV 95.5 80.0 - 100.0 fL   MCH 29.7 26.0 - 34.0 pg   MCHC 31.1 30.0 - 36.0 g/dL   RDW 14.6 11.5 - 15.5 %   Platelets 228 150 - 400 K/uL   nRBC 0.0 0.0 - 0.2 %    Comment:  Performed at [facility], [address], [place], [place] [address]  Glucose, capillary     Status: Abnormal   Collection Time: 04/03/19  7:35 AM  Result Value Ref Range   Glucose-Capillary 143 (H) 70 - 99 mg/dL  Heparin level (unfractionated)     Status: Abnormal   Collection Time: 04/03/19  8:22 AM  Result Value Ref Range   Heparin Unfractionated 1.32 (H) 0.30 - 0.70 IU/mL    Comment: RESULTS CONFIRMED BY MANUAL DILUTION (NOTE) If heparin results are below expected values, and patient dosage has  been confirmed, suggest follow up testing of antithrombin III levels. Performed at [facility], [address] [place], [place] [address]   Glucose, capillary     Status: Abnormal   Collection Time: 04/03/19 11:29 AM  Result Value Ref Range   Glucose-Capillary 140 (H) 70 - 99 mg/dL  Glucose, capillary     Status: Abnormal   Collection Time: 04/03/19  4:23 PM  Result Value Ref Range   Glucose-Capillary 139 (H) 70 - 99 mg/dL  Heparin level (unfractionated)     Status: Abnormal   Collection Time: 04/03/19  6:49 PM  Result Value Ref Range   Heparin Unfractionated 1.22 (H) 0.30 - 0.70 IU/mL    Comment: RESULTS CONFIRMED BY MANUAL DILUTION Performed at [facility], [address], [place], [place] [address]   Glucose, capillary     Status: Abnormal   Collection Time: 04/03/19  9:25 PM  Result Value Ref Range   Glucose-Capillary 137 (H) 70 - 99 mg/dL  Glucose, capillary     Status: Abnormal   Collection Time: 04/03/19 11:53 PM  Result Value Ref Range   Glucose-Capillary 129 (H) 70 - 99 mg/dL  Basic metabolic panel     Status: Abnormal   Collection Time: 04/04/19  4:08 AM  Result Value Ref Range   Sodium 149 (H) 135 - 145 mmol/L   Potassium 3.3 (L) 3.5 - 5.1 mmol/L   Chloride 105 98 - 111 mmol/L   CO2 28 22 - 32 mmol/L   Glucose, Bld 196 (H) 70 - 99 mg/dL   BUN 80 (H) 8 - 23 mg/dL   Creatinine, Ser 1.01 (H) 0.44 - 1.00 mg/dL   Calcium 9.1 8.9 - 10.3 mg/dL   GFR calc non Af  Amer  50 (L) >60 mL/min   GFR calc Af Amer 58 (L) >60 mL/min   Anion gap 16 (H) 5 - 15    Comment: Performed at [facility] [address], [place], [place] [address]  Heparin level (unfractionated)     Status: None   Collection Time: 04/04/19  4:08 AM  Result Value Ref Range   Heparin Unfractionated 0.47 0.30 - 0.70 IU/mL    Comment: (NOTE) If heparin results are below expected values, and patient dosage has  been confirmed, suggest follow up testing of antithrombin III levels. Performed at [facility], [address], [place], [place] [address]   CBC     Status: Abnormal   Collection Time: 04/04/19  4:08 AM  Result Value Ref Range   WBC 27.6 (H) 4.0 - 10.5 K/uL   RBC 4.04 3.87 - 5.11 MIL/uL   Hemoglobin 11.9 (L) 12.0 - 15.0 g/dL   HCT 38.7 36.0 - 46.0 %   MCV 95.8 80.0 - 100.0 fL   MCH 29.5 26.0 - 34.0 pg   MCHC 30.7 30.0 - 36.0 g/dL   RDW 14.5 11.5 - 15.5 %   Platelets 211 150 - 400 K/uL   nRBC 0.0 0.0 - 0.2 %    Comment: Performed at [facility], [address] [place], [place] [address]  Magnesium     Status: None   Collection Time: 04/04/19  4:08 AM  Result Value Ref Range   Magnesium 2.0 1.7 - 2.4 mg/dL    Comment: Performed at [facility], [address] [place], [place] [address]  Glucose, capillary     Status: Abnormal   Collection Time: 04/04/19  4:16 AM  Result Value Ref Range   Glucose-Capillary 204 (H) 70 - 99 mg/dL  Blood gas, arterial     Status: Abnormal   Collection Time: 04/04/19  4:23 AM  Result Value Ref Range   FIO2 50.00    Delivery systems VENTILATOR    Mode PRESSURE REGULATED VOLUME CONTROL    VT 430 mL   LHR 24 resp/min   Peep/cpap 5.0 cm H20   pH, Arterial 7.495 (H) 7.350 - 7.450   pCO2 arterial 46.7 32.0 - 48.0 mmHg   pO2, Arterial 62.4 (L) 83.0 - 108.0 mmHg   Bicarbonate 34.6 (H) 20.0 - 28.0 mmol/L   Acid-Base Excess 11.6 (H) 0.0 - 2.0 mmol/L   O2 Saturation 92.1 %   Patient temperature 37.0    Collection site RIGHT RADIAL    Drawn by  [id number]    Allens test (pass/fail) PASS PASS    Comment: Performed at [facility], [address] [place], [place] [address]  Glucose, capillary     Status: Abnormal   Collection Time: 04/04/19  7:32 AM  Result Value Ref Range   Glucose-Capillary 109 (H) 70 - 99 mg/dL    ABGS Recent Labs    04/04/19 0423  PHART 7.495*  PO2ART 62.4*  HCO3 34.6*   CULTURES Recent Results (from the past 240 hour(s))  SARS Coronavirus 2 (CEPHEID - Performed in [place] hospital lab), Hosp Order     Status: None   Collection Time: 03/22/2019  2:26 PM   Specimen: Nasopharyngeal Swab  Result Value Ref Range Status   SARS Coronavirus 2 NEGATIVE NEGATIVE Final    Comment: (NOTE) If result is NEGATIVE SARS-CoV-2 target nucleic acids are NOT DETECTED. The SARS-CoV-2 RNA is generally detectable in upper and lower  respiratory specimens during the acute phase of infection.  The lowest  concentration of SARS-CoV-2 viral copies this assay can detect is 250  copies / mL. A negative result does not preclude SARS-CoV-2 infection  and should not be used as the sole basis for treatment or other  patient management decisions.  A negative result may occur with  improper specimen collection / handling, submission of specimen other  than nasopharyngeal swab, presence of viral mutation(s) within the  areas targeted by this assay, and inadequate number of viral copies  (<250 copies / mL). A negative result must be combined with clinical  observations, patient history, and epidemiological information. If result is POSITIVE SARS-CoV-2 target nucleic acids are DETECTED. The SARS-CoV-2 RNA is generally detectable in upper and lower  respiratory specimens dur ing the acute phase of infection.  Positive  results are indicative of active infection with SARS-CoV-2.  Clinical  correlation with patient history and other diagnostic information is  necessary to determine patient infection status.  Positive results do  not  rule out bacterial infection or co-infection with other viruses. If result is PRESUMPTIVE POSTIVE SARS-CoV-2 nucleic acids MAY BE PRESENT.   A presumptive positive result was obtained on the submitted specimen  and confirmed on repeat testing.  While 2019 novel coronavirus  (SARS-CoV-2) nucleic acids may be present in the submitted sample  additional confirmatory testing may be necessary for epidemiological  and / or clinical management purposes  to differentiate between  SARS-CoV-2 and other Sarbecovirus currently known to infect humans.  If clinically indicated additional testing with an alternate test  methodology [phone]) is advised. The SARS-CoV-2 RNA is generally  detectable in upper and lower respiratory sp ecimens during the acute  phase of infection. The expected result is Negative. Fact Sheet for Patients:  StrictlyIdeas.no Fact Sheet for Healthcare Providers: BankingDealers.co.za This test is not yet approved or cleared by the [place] FDA and has been authorized for detection and/or diagnosis of SARS-CoV-2 by FDA under an Emergency Use Authorization (EUA).  This EUA will remain in effect (meaning this test can be used) for the duration of the COVID-19 declaration under Section 564(b)(1) of the Act, 21 U.S.C. section 360bbb-3(b)(1), unless the authorization is terminated or revoked sooner. Performed at [facility], [address] [place], [place] [address]   MRSA PCR Screening     Status: None   Collection Time: 03/19/2019  7:54 PM   Specimen: Nasal Mucosa; Nasopharyngeal  Result Value Ref Range Status   MRSA by PCR NEGATIVE NEGATIVE Final    Comment:        The GeneXpert MRSA Assay (FDA approved for NASAL specimens only), is one component of a comprehensive MRSA colonization surveillance program. It is not intended to diagnose MRSA infection nor to guide or monitor treatment for MRSA infections. Performed at  [facility], [address], [place], [place] [address]   Culture, blood (Routine X 2) w Reflex to ID Panel     Status: None   Collection Time: 03/20/2019  9:50 PM   Specimen: Left Antecubital; Blood  Result Value Ref Range Status   Specimen Description LEFT ANTECUBITAL  Final   Special Requests   Final    BOTTLES DRAWN AEROBIC AND ANAEROBIC Blood Culture adequate volume   Culture   Final    NO GROWTH 5 DAYS Performed at [facility], [address], [place], [place] [address]    Report Status 03/30/2019 FINAL  Final   Studies/Results: Dg Chest Port 1 View  Result Date: 04/03/2019 CLINICAL DATA:  Respiratory  failure, hypertension, atrial fibrillation EXAM: PORTABLE CHEST 1 VIEW COMPARISON:  Portable exam 1033 hours compared to 04/02/2019 FINDINGS: LEFT arm PICC line tip projects over SVC. Tip of endotracheal tube projects 4.1 cm above carina. Enlargement of cardiac silhouette. Airspace infiltrates identified throughout mid to lower LEFT lung and at RIGHT base consistent with multifocal pneumonia. Less RIGHT perihilar infiltrate compared to previous exam. Persistent LEFT pleural effusion. No pneumothorax. Bones demineralized. IMPRESSION: Persistent BILATERAL pulmonary infiltrates and LEFT pleural effusion. Electronically Signed   By: Lavonia Dana M.D.   On: 04/03/2019 12:52    Medications:  Prior to Admission:  Medications Prior to Admission  Medication Sig Dispense Refill Last Dose  . apixaban (ELIQUIS) 2.5 MG TABS tablet Take 2.5 mg by mouth 2 (two) times daily.    03/24/2019 at Unknown time  . aspirin 81 MG tablet Take 81 mg by mouth daily.   unknown  . Calcium Carbonate-Vitamin D (CALCIUM + D PO) Take 1 tablet by mouth daily.    unknown  . digoxin (LANOXIN) 0.125 MG tablet Take 0.125 mg by mouth daily.   03/24/2019 at Unknown time  . furosemide (LASIX) 20 MG tablet Take 20 mg by mouth See admin instructions. Take one tablet by mouth daily 5 days per week   03/23/2019 at Unknown time  .  losartan (COZAAR) 50 MG tablet Take 50 mg by mouth daily.    03/24/2019 at Unknown time  . metoprolol tartrate (LOPRESSOR) 100 MG tablet Take 100 mg by mouth 2 (two) times daily.    03/24/2019 at Unknown time  . Multiple Vitamin (MULTIVITAMIN) tablet Take 1 tablet by mouth daily.   03/24/2019  . ondansetron (ZOFRAN) 4 MG tablet Take 4 mg by mouth daily as needed for nausea or vomiting.   03/24/2019 at Unknown time  . potassium chloride (K-DUR) 10 MEQ tablet Take 10 mEq by mouth See admin instructions. Take one tablet daily by mouth 5 days per week   03/23/2019 at Unknown time  . Cholecalciferol (VITAMIN D PO) Take 1 capsule by mouth daily.    03/24/2019   Scheduled: . chlorhexidine gluconate (MEDLINE KIT)  15 mL Mouth Rinse BID  . Chlorhexidine Gluconate Cloth  6 each Topical Daily  . feeding supplement (OSMOLITE 1.5 CAL)  1,000 mL Per Tube Q24H  . feeding supplement (PRO-STAT SUGAR FREE 64)  30 mL Per Tube TID  . free water  100 mL Per Tube Q8H  . furosemide  60 mg Intravenous BID  . insulin aspart  5 Units Subcutaneous Q4H  . mouth rinse  15 mL Mouth Rinse 10 times per day  . methylPREDNISolone (SOLU-MEDROL) injection  40 mg Intravenous Q12H  . metoprolol tartrate  5 mg Intravenous Q8H  . pantoprazole (PROTONIX) IV  40 mg Intravenous Q24H  . potassium chloride  40 mEq Oral Once  . sodium chloride flush  10-40 mL Intracatheter Q12H  . sodium chloride flush  3 mL Intravenous Q12H   Continuous: . sodium chloride 10 mL/hr at 03/31/19 0600  . amiodarone 30 mg/hr (04/04/19 0650)  . fentaNYL infusion INTRAVENOUS 75 mcg/hr (04/04/19 0650)  . heparin 1,000 Units/hr (04/04/19 0650)  . meropenem [facility]) IV Stopped (04/03/19 2208)   YKD:XIPJAS chloride, acetaminophen **OR** acetaminophen, albuterol, bisacodyl, fentaNYL, midazolam, midazolam, ondansetron **OR** ondansetron (ZOFRAN) IV, phenol, polyethylene glycol, sodium chloride flush, sodium chloride flush, traZODone  Assesment: She was admitted with  small bowel obstruction was improving and then developed acute respiratory distress and had marked bilateral infiltrates with adult  respiratory distress syndrome.  She was intubated at that point placed on mechanical ventilation and she has been on the ventilator since.  She has improved and has switched back to standard mode of ventilation rather than the ARDS protocol.  She was down to 40% yesterday 50% today.  Her oxygenation is adequate on 50%.  She is no longer overbreathing the ventilator.  Chest x-ray is improving  She had acute diastolic heart failure and she is diuresing.  She is still positive about 5.5 L but she was negative about 800 yesterday.  She is on nutritional support with tube feedings  She has atrial fib and had RVR and that is better on amiodarone  Her small bowel obstruction has resolved  She had pneumonia from aspiration and probably aspirated again when she had her acute deterioration Principal Problem:   SBO (small bowel obstruction) (HCC) Active Problems:   Essential hypertension   Chronic a-fib   PNA (pneumonia)--Aspiration   Respiratory failure (HCC)   Acute diastolic CHF (congestive heart failure) (HCC)   Atrial fibrillation with RVR (HCC)   Elevated troponin   Lobar pneumonia (HCC)   Coronary artery disease due to lipid rich plaque    Plan: Continue current treatments.  See if we can wean her oxygen any today.      LOS: 10 days   Alonza Bogus 04/04/2019, 8:15 AM

## 2019-04-04 NOTE — Progress Notes (Signed)
ANTICOAGULATION CONSULT NOTE - Follow Up Consult  Pharmacy Consult for heparin Indication: atrial fibrillation  HEPARIN DW (KG): 47.6   Labs: Recent Labs    04/02/19 0414 04/03/19 0508 04/03/19 0822 04/03/19 1849 04/04/19 0408  HGB  --  12.6  --   --  11.9*  HCT  --  40.5  --   --  38.7  PLT  --  228  --   --  211  HEPARINUNFRC 0.67 1.32* 1.32* 1.22* 0.47  CREATININE 1.06* 0.98  --   --  1.01*    Assessment: 85yo female with history of atrial fibrillation. Heparin is supratherapeutic now at 1.32 and had been therapeutic for ~48 hours. Had HL redrawn to confirm; discussed with RN and she did the blood draw from PICC line on right arm and heparin infusing on left arm.  6/16 at 1849 HL remains supratherapeutic at 1.22, confirmed with RN drawn from opposite arm. 6//17 0500, HL is therapeutic this AM.  Goal of Therapy:  Heparin level 0.3-0.7 units/ml  Monitor platelets per protocol   Plan:  Continue heparin infusion at 1000 units/hr Check level in ~8 hrs and every am.  Monitor labs and s/s of bleeding     Carrie Morales, BS Carrie Morales, BCPS Clinical Pharmacist Pager [phone] 04/04/2019 7:35 AM

## 2019-04-04 NOTE — Progress Notes (Signed)
PROGRESS NOTE    Carrie Morales  MLY:[id number] DOB: [birth year] DOA: 03/24/2019 PCP: Earney Mallet, MD   Brief Narrative:  Per HPI: 85 year old female with a history of chronic atrial fibrillation on apixaban, diastolic CHF, hypertension, bowel obstruction status post bowel resection 30 years ago presenting with 1 week history of abdominal pain that worsened over the past 2 to 3 days to the point of resulting in nausea and vomiting. The patient was also having constipation-like symptoms and tried some over-the-counter laxatives without relief. Because of her progressive symptoms she presented for further evaluation. CT of the abdomen and pelvis in the ED showed left basilar collapse less consolidative changes, small bilateral pleural effusions, left greater than right with a large hiatus hernia. There was also dilatation of the proximal and mid small bowel loops up to 2.9 cm consistent with a high-grade SBO. The patient was treated with bowel rest and IV fluids. When she arrived to the medical floor, the patient developed atrial fibrillation with RVR and respiratory distress. She was transferred to the stepdown unit and started on diltiazem drip and placed on BiPAP. Subsequently, the patient became hypotensive requiring fluid resuscitation. Repeat chest x-ray show worsening bilateral perihilar densities with stable bibasilar opacifications. Lactic acid peaked at 4.4. The patient was started on ceftriaxone and metronidazole.   Assessment & Plan:   Principal Problem:   SBO (small bowel obstruction) (HCC) Active Problems:   Essential hypertension   Chronic a-fib   PNA (pneumonia)--Aspiration   Respiratory failure (HCC)   Acute diastolic CHF (congestive heart failure) (HCC)   Atrial fibrillation with RVR (HCC)   Elevated troponin   Lobar pneumonia (HCC)   Coronary artery disease due to lipid rich plaque   Sepsis secondary to pneumonia -Improving with Merrem to discontinue  after today -Blood cultures with no growth and urine analysis negative  Acute hypoxemic and hypercapnic respiratory failure secondary to above -Associated ARDS as well as acute on chronic heart failure noted -Appreciate pulmonology assistance and recommendations with FiO2 weaned to 40% today. -Continue Solu-Medrol 40 mg IV twice daily as well as breathing treatments -Diuresis with 1 dose of Lasix today per cardiology -Chest x-ray demonstrating some improvement and ABG stable  Small bowel obstruction -SBO has resolved and appreciate general surgery recommendations -Continue electrolyte supplementation as tolerated -Continue tube feeds  Atrial fibrillation with RVR-controlled -Appreciate cardiology recommendations with initiation of oral metoprolol and weaning of amiodarone today -Continue heparin drip  Acute on chronic diastolic CHF -Continue to monitor I's and O's as well as daily weights -1 dose IV Lasix today due to rising BUN, but this could be secondary to steroids -Good urine output noted  AKI on CKD stage III (baseline creatinine 0.8-1.1) -Appears to have resolved and was likely secondary to hypotension -Continue to monitor urine output and daily labs while on Lasix  Pericardial effusion with MR -Repeat echocardiogram per cardiology in the next few days -No tamponade physiology noted  Mild hyperglycemia-likely steroid-induced -A1c 5.3% -Continue close monitoring   DVT prophylaxis: IV heparin Code Status: Full Family Communication: Updated son, Nicole Kindred, at bedside on 6/17 Disposition Plan: Continue ongoing management of heart rate and diuresis as well as ventilator weaning.   Consultants:   General surgery  Cardiology  Pulmonology  Procedures:   None  Antimicrobials:  Ceftriaxone/flagyl 6/7>>6/8 Merrem 6/8>>> anticipated last day of antibiotics 04/04/2019.   Subjective: Patient seen and evaluated today with no new acute complaints or concerns. No acute  concerns or events noted overnight.  She remains  intubated and on mechanical ventilation with FiO2 increased to 50% over the last 24 hours.  Objective: Vitals:   04/04/19 0930 04/04/19 1000 04/04/19 1030 04/04/19 1100  BP: 110/60 119/62 119/75 (!) 105/57  Pulse:      Resp: (!) 24 (!) 24 (!) 24 (!) 24  Temp: 99 F (37.2 C) 99.1 F (37.3 C) 99.1 F (37.3 C) 99.3 F (37.4 C)  TempSrc:      SpO2: 92% 92% 96% 92%  Weight:      Height:        Intake/Output Summary (Last 24 hours) at 04/04/2019 1116 Last data filed at 04/04/2019 0650 Gross per 24 hour  Intake 1292.51 ml  Output 2075 ml  Net -782.49 ml   Filed Weights   04/02/19 0500 04/03/19 0500 04/04/19 0500  Weight: 53.4 kg 52.5 kg 49.6 kg    Examination:  General exam: Appears calm and comfortable  Respiratory system: Clear to auscultation. Respiratory effort normal.  Intubated on ventilator FiO2 50%. Cardiovascular system: S1 & S2 heard, irregular rate. No JVD, murmurs, rubs, gallops or clicks. No pedal edema. Gastrointestinal system: Abdomen is nondistended, soft and nontender. No organomegaly or masses felt. Normal bowel sounds heard. Central nervous system: Alert and awake Extremities: Symmetric 5 x 5 power. Skin: No rashes, lesions or ulcers Psychiatry: Cannot be assessed.     Data Reviewed: I have personally reviewed following labs and imaging studies  CBC: Recent Labs  Lab 03/30/19 0444 03/31/19 0217 04/01/19 0450 04/03/19 0508 04/04/19 0408  WBC 17.8* 14.9* 18.0* 20.6* 27.6*  HGB 12.0 11.0* 13.0 12.6 11.9*  HCT 40.0 36.8 41.5 40.5 38.7  MCV 99.5 100.5* 98.1 95.5 95.8  PLT 224 168 220 228 974   Basic Metabolic Panel: Recent Labs  Lab 03/31/19 0217 03/31/19 1245 03/31/19 1919 04/01/19 0450 04/01/19 1711 04/02/19 0414 04/03/19 0508 04/04/19 0408  NA 146*  --   --  143  --  148* 148* 149*  K 6.1*  --   --  4.4  --  3.7 3.1* 3.3*  CL 112*  --   --  107  --  108 106 105  CO2 27  --   --  30  --   _0 GLUCOSE 318*  --   --  188*  --  164* 166* 196*  BUN 35*  --   --  47*  --  66* 75* 80*  CREATININE 0.81  --   --  1.01*  --  1.06* 0.98 1.01*  CALCIUM 9.6  --   --  9.9  --  10.1 9.5 9.1  MG  --  2.1 2.1 2.2 2.1  --   --  2.0  PHOS  --  2.7 3.0 2.9 2.9  --   --   --    GFR: Estimated Creatinine Clearance: 30.1 mL/min (A) (by C-G formula based on SCr of 1.01 mg/dL (H)). Liver Function Tests: Recent Labs  Lab 04/04/19 0408  AST 50*  ALT 93*  ALKPHOS 76  BILITOT 0.5  PROT 4.6*  ALBUMIN 1.9*   No results for input(s): LIPASE, AMYLASE in the last 168 hours. No results for input(s): AMMONIA in the last 168 hours. Coagulation Profile: No results for input(s): INR, PROTIME in the last 168 hours. Cardiac Enzymes: No results for input(s): CKTOTAL, CKMB, CKMBINDEX, TROPONINI in the last 168 hours. BNP (last 3 results) No results for input(s): PROBNP in the last 8760 hours. HbA1C: No results for input(s): HGBA1C  in the last 72 hours. CBG: Recent Labs  Lab 04/03/19 2125 04/03/19 2353 04/04/19 0416 04/04/19 0732 04/04/19 1108  GLUCAP 137* 129* 204* 109* 121*   Lipid Profile: No results for input(s): CHOL, HDL, LDLCALC, TRIG, CHOLHDL, LDLDIRECT in the last 72 hours. Thyroid Function Tests: No results for input(s): TSH, T4TOTAL, FREET4, T3FREE, THYROIDAB in the last 72 hours. Anemia Panel: No results for input(s): VITAMINB12, FOLATE, FERRITIN, TIBC, IRON, RETICCTPCT in the last 72 hours. Sepsis Labs: Recent Labs  Lab 03/30/19 0444  LATICACIDVEN 1.5    Recent Results (from the past 240 hour(s))  SARS Coronavirus 2 (CEPHEID - Performed in [facility] hospital lab), Hosp Order     Status: None   Collection Time: 04/11/2019  2:26 PM   Specimen: Nasopharyngeal Swab  Result Value Ref Range Status   SARS Coronavirus 2 NEGATIVE NEGATIVE Final    Comment: (NOTE) If result is NEGATIVE SARS-CoV-2 target nucleic acids are NOT DETECTED. The SARS-CoV-2 RNA is generally  detectable in upper and lower  respiratory specimens during the acute phase of infection. The lowest  concentration of SARS-CoV-2 viral copies this assay can detect is 250  copies / mL. A negative result does not preclude SARS-CoV-2 infection  and should not be used as the sole basis for treatment or other  patient management decisions.  A negative result may occur with  improper specimen collection / handling, submission of specimen other  than nasopharyngeal swab, presence of viral mutation(s) within the  areas targeted by this assay, and inadequate number of viral copies  (<250 copies / mL). A negative result must be combined with clinical  observations, patient history, and epidemiological information. If result is POSITIVE SARS-CoV-2 target nucleic acids are DETECTED. The SARS-CoV-2 RNA is generally detectable in upper and lower  respiratory specimens dur ing the acute phase of infection.  Positive  results are indicative of active infection with SARS-CoV-2.  Clinical  correlation with patient history and other diagnostic information is  necessary to determine patient infection status.  Positive results do  not rule out bacterial infection or co-infection with other viruses. If result is PRESUMPTIVE POSTIVE SARS-CoV-2 nucleic acids MAY BE PRESENT.   A presumptive positive result was obtained on the submitted specimen  and confirmed on repeat testing.  While 2019 novel coronavirus  (SARS-CoV-2) nucleic acids may be present in the submitted sample  additional confirmatory testing may be necessary for epidemiological  and / or clinical management purposes  to differentiate between  SARS-CoV-2 and other Sarbecovirus currently known to infect humans.  If clinically indicated additional testing with an alternate test  methodology [phone]) is advised. The SARS-CoV-2 RNA is generally  detectable in upper and lower respiratory sp ecimens during the acute  phase of infection. The  expected result is Negative. Fact Sheet for Patients:  StrictlyIdeas.no Fact Sheet for Healthcare Providers: BankingDealers.co.za This test is not yet approved or cleared by the [place] FDA and has been authorized for detection and/or diagnosis of SARS-CoV-2 by FDA under an Emergency Use Authorization (EUA).  This EUA will remain in effect (meaning this test can be used) for the duration of the COVID-19 declaration under Section 564(b)(1) of the Act, 21 U.S.C. section 360bbb-3(b)(1), unless the authorization is terminated or revoked sooner. Performed at [facility], [address] [place],  [address]   MRSA PCR Screening     Status: None   Collection Time: 04/02/2019  7:54 PM   Specimen: Nasal Mucosa; Nasopharyngeal  Result Value Ref  Range Status   MRSA by PCR NEGATIVE NEGATIVE Final    Comment:        The GeneXpert MRSA Assay (FDA approved for NASAL specimens only), is one component of a comprehensive MRSA colonization surveillance program. It is not intended to diagnose MRSA infection nor to guide or monitor treatment for MRSA infections. Performed at [facility], [address] [place], [place] [address]   Culture, blood (Routine X 2) w Reflex to ID Panel     Status: None   Collection Time: 03/27/2019  9:50 PM   Specimen: Left Antecubital; Blood  Result Value Ref Range Status   Specimen Description LEFT ANTECUBITAL  Final   Special Requests   Final    BOTTLES DRAWN AEROBIC AND ANAEROBIC Blood Culture adequate volume   Culture   Final    NO GROWTH 5 DAYS Performed at [facility], [address], [place], [place] [address]    Report Status 03/30/2019 FINAL  Final         Radiology Studies: Dg Chest Port 1 View  Result Date: 04/04/2019 CLINICAL DATA:  Respiratory failure.  Atrial fibrillation EXAM: PORTABLE CHEST 1 VIEW COMPARISON:  04/03/2019 FINDINGS: Multiple tube is a overlies the midline chest making it  difficult to be certain endotracheal placement. I cannot tell whether the endotracheal tube is above the carina or in the right mainstem bronchus. Persistent bilateral pulmonary infiltrates left worse than right. Possible mild improvement on the left and worsening on right. There may be an element associated edema as well. Small effusions left more than right. Left arm PICC tip unchanged in the SVC. IMPRESSION: Slight improvement of left lung infiltrates. Slight worsening of right lung infiltrates. Possible mild edema. Small effusions left right. Cannot be sure of endotracheal position due to multiple overlying artifacts. Electronically Signed   By: Nelson Chimes M.D.   On: 04/04/2019 09:11   Dg Chest Port 1 View  Result Date: 04/03/2019 CLINICAL DATA:  Respiratory failure, hypertension, atrial fibrillation EXAM: PORTABLE CHEST 1 VIEW COMPARISON:  Portable exam 1033 hours compared to 04/02/2019 FINDINGS: LEFT arm PICC line tip projects over SVC. Tip of endotracheal tube projects 4.1 cm above carina. Enlargement of cardiac silhouette. Airspace infiltrates identified throughout mid to lower LEFT lung and at RIGHT base consistent with multifocal pneumonia. Less RIGHT perihilar infiltrate compared to previous exam. Persistent LEFT pleural effusion. No pneumothorax. Bones demineralized. IMPRESSION: Persistent BILATERAL pulmonary infiltrates and LEFT pleural effusion. Electronically Signed   By: Lavonia Dana M.D.   On: 04/03/2019 12:52        Scheduled Meds: . chlorhexidine gluconate (MEDLINE KIT)  15 mL Mouth Rinse BID  . Chlorhexidine Gluconate Cloth  6 each Topical Daily  . feeding supplement (OSMOLITE 1.5 CAL)  1,000 mL Per Tube Q24H  . feeding supplement (PRO-STAT SUGAR FREE 64)  30 mL Per Tube TID  . free water  100 mL Per Tube Q8H  . insulin aspart  5 Units Subcutaneous Q4H  . mouth rinse  15 mL Mouth Rinse 10 times per day  . methylPREDNISolone (SOLU-MEDROL) injection  40 mg Intravenous Q12H  .  metoprolol tartrate  5 mg Intravenous Q8H  . metoprolol tartrate  25 mg Oral Q6H  . pantoprazole (PROTONIX) IV  40 mg Intravenous Q24H  . sodium chloride flush  10-40 mL Intracatheter Q12H  . sodium chloride flush  3 mL Intravenous Q12H   Continuous Infusions: . sodium chloride 10 mL/hr at 03/31/19 0600  . fentaNYL infusion INTRAVENOUS 75  mcg/hr (04/04/19 0650)  . heparin 1,000 Units/hr (04/04/19 0650)  . meropenem (MERREM) IV 1,000 mg (04/04/19 1007)     LOS: 10 days    Time spent: 30 minutes    Verla Bryngelson Darleen Crocker, DO [facility] Pager [phone]  If 7PM-7AM, please contact night-coverage [email or web address] Password [facility] 04/04/2019, 11:16 AM

## 2019-04-04 NOTE — Progress Notes (Signed)
Pt had 3rd Type 7 stool in 24 hours, not sure if from TFs or C-diff. WBC increased to 27.6 this am from 20 yesterday. Pt has no c/o abd pain. Tylene Fantasia, NP paged and made aware. Waiting to see if C-diff sample is ordered.

## 2019-04-04 NOTE — Progress Notes (Signed)
ANTICOAGULATION CONSULT NOTE - Follow Up Consult  Pharmacy Consult for heparin Indication: atrial fibrillation  HEPARIN DW (KG): 47.6   Labs: Recent Labs    04/02/19 0414 04/03/19 0508  04/03/19 1849 04/04/19 0408 04/04/19 1400  HGB  --  12.6  --   --  11.9*  --   HCT  --  40.5  --   --  38.7  --   PLT  --  228  --   --  211  --   HEPARINUNFRC 0.67 1.32*   < > 1.22* 0.47 2.20*  CREATININE 1.06* 0.98  --   --  1.01*  --    < > = values in this interval not displayed.    Assessment: 85yo female with history of atrial fibrillation. Heparin is supratherapeutic now at 1.32 and had been therapeutic for ~48 hours. Had HL redrawn to confirm; discussed with RN and she did the blood draw from PICC line on right arm and heparin infusing on left arm.  6/16 at 1849 HL remains supratherapeutic at 1.22, confirmed with RN drawn from opposite arm. 6//17 0500, HL is therapeutic this AM. 6/17 1600, HL 2.2, supratherapeutic   Goal of Therapy:  Heparin level 0.3-0.7 units/ml  Monitor platelets per protocol   Plan:  Hold heparin 1 hour RN Lilia Pro is checking patient for signs or symptoms of bleeding now  Restart heparin infusion at 700 units/hr Check level in ~8 hrs and every am.  Monitor labs and s/s of bleeding     Thomasenia Sales, PharmD, MBA, BCGP Clinical Pharmacist  04/04/2019 3:51 PM

## 2019-04-04 NOTE — Progress Notes (Signed)
Progress Note  Patient Name: Carrie Morales Date of Encounter: 04/04/2019  Primary Cardiologist: Dr Darral Dash  Subjective   No events overnight  Inpatient Medications    Scheduled Meds: . chlorhexidine gluconate (MEDLINE KIT)  15 mL Mouth Rinse BID  . Chlorhexidine Gluconate Cloth  6 each Topical Daily  . feeding supplement (OSMOLITE 1.5 CAL)  1,000 mL Per Tube Q24H  . feeding supplement (PRO-STAT SUGAR FREE 64)  30 mL Per Tube TID  . free water  100 mL Per Tube Q8H  . furosemide  60 mg Intravenous BID  . insulin aspart  5 Units Subcutaneous Q4H  . mouth rinse  15 mL Mouth Rinse 10 times per day  . methylPREDNISolone (SOLU-MEDROL) injection  40 mg Intravenous Q12H  . metoprolol tartrate  5 mg Intravenous Q8H  . pantoprazole (PROTONIX) IV  40 mg Intravenous Q24H  . potassium chloride  40 mEq Oral Once  . sodium chloride flush  10-40 mL Intracatheter Q12H  . sodium chloride flush  3 mL Intravenous Q12H   Continuous Infusions: . sodium chloride 10 mL/hr at 03/31/19 0600  . amiodarone 30 mg/hr (04/04/19 0650)  . fentaNYL infusion INTRAVENOUS 75 mcg/hr (04/04/19 0650)  . heparin 1,000 Units/hr (04/04/19 0650)  . meropenem (MERREM) IV     PRN Meds: sodium chloride, acetaminophen **OR** acetaminophen, albuterol, bisacodyl, fentaNYL, midazolam, midazolam, ondansetron **OR** ondansetron (ZOFRAN) IV, phenol, polyethylene glycol, sodium chloride flush, sodium chloride flush, traZODone   Vital Signs    Vitals:   04/04/19 0800 04/04/19 0819 04/04/19 0830 04/04/19 0900  BP: (!) 102/57 (!) 102/57 124/67 121/75  Pulse:  96    Resp: (!) 24 (!) 24 19 (!) 24  Temp: 99 F (37.2 C)  98.8 F (37.1 C) 99 F (37.2 C)  TempSrc:      SpO2: 90% 92% 92% 95%  Weight:      Height:        Intake/Output Summary (Last 24 hours) at 04/04/2019 0935 Last data filed at 04/04/2019 0650 Gross per 24 hour  Intake 1292.51 ml  Output 2075 ml  Net -782.49 ml   Last 3 Weights 04/04/2019  04/03/2019 04/02/2019  Weight (lbs) 109 lb 5.6 oz 115 lb 11.9 oz 117 lb 11.6 oz  Weight (kg) 49.6 kg 52.5 kg 53.4 kg      Telemetry    afib variable rates- Personally Reviewed  ECG    n/a - Personally Reviewed  Physical Exam   GEN: No acute distress.   Neck: No JVD Cardiac: irreg, no murmurs, rubs, or gallops.  Respiratory: Clear to auscultation bilaterally. GI: Soft, nontender, non-distended  MS: No edema; No deformity. Neuro:  Nonfocal  Psych: Normal affect   Labs    Chemistry Recent Labs  Lab 04/02/19 0414 04/03/19 0508 04/04/19 0408  NA 148* 148* 149*  K 3.7 3.1* 3.3*  CL 108 106 105  CO2 '31 30 28  '$ GLUCOSE 164* 166* 196*  BUN 66* 75* 80*  CREATININE 1.06* 0.98 1.01*  CALCIUM 10.1 9.5 9.1  PROT  --   --  4.6*  ALBUMIN  --   --  1.9*  AST  --   --  50*  ALT  --   --  93*  ALKPHOS  --   --  76  BILITOT  --   --  0.5  GFRNONAA 47* 52* 50*  GFRAA 54* 60* 58*  ANIONGAP 9 12 16*     Hematology Recent Labs  Lab 04/01/19 0450 04/03/19  2706 04/04/19 0408  WBC 18.0* 20.6* 27.6*  RBC 4.23 4.24 4.04  HGB 13.0 12.6 11.9*  HCT 41.5 40.5 38.7  MCV 98.1 95.5 95.8  MCH 30.7 29.7 29.5  MCHC 31.3 31.1 30.7  RDW 14.4 14.6 14.5  PLT 220 228 211    Cardiac EnzymesNo results for input(s): TROPONINI in the last 168 hours. No results for input(s): TROPIPOC in the last 168 hours.   BNPNo results for input(s): BNP, PROBNP in the last 168 hours.   DDimer No results for input(s): DDIMER in the last 168 hours.   Radiology    Dg Chest Port 1 View  Result Date: 04/04/2019 CLINICAL DATA:  Respiratory failure.  Atrial fibrillation EXAM: PORTABLE CHEST 1 VIEW COMPARISON:  04/03/2019 FINDINGS: Multiple tube is a overlies the midline chest making it difficult to be certain endotracheal placement. I cannot tell whether the endotracheal tube is above the carina or in the right mainstem bronchus. Persistent bilateral pulmonary infiltrates left worse than right. Possible mild  improvement on the left and worsening on right. There may be an element associated edema as well. Small effusions left more than right. Left arm PICC tip unchanged in the SVC. IMPRESSION: Slight improvement of left lung infiltrates. Slight worsening of right lung infiltrates. Possible mild edema. Small effusions left right. Cannot be sure of endotracheal position due to multiple overlying artifacts. Electronically Signed   By: Nelson Chimes M.D.   On: 04/04/2019 09:11   Dg Chest Port 1 View  Result Date: 04/03/2019 CLINICAL DATA:  Respiratory failure, hypertension, atrial fibrillation EXAM: PORTABLE CHEST 1 VIEW COMPARISON:  Portable exam 1033 hours compared to 04/02/2019 FINDINGS: LEFT arm PICC line tip projects over SVC. Tip of endotracheal tube projects 4.1 cm above carina. Enlargement of cardiac silhouette. Airspace infiltrates identified throughout mid to lower LEFT lung and at RIGHT base consistent with multifocal pneumonia. Less RIGHT perihilar infiltrate compared to previous exam. Persistent LEFT pleural effusion. No pneumothorax. Bones demineralized. IMPRESSION: Persistent BILATERAL pulmonary infiltrates and LEFT pleural effusion. Electronically Signed   By: Lavonia Dana M.D.   On: 04/03/2019 12:52    Cardiac Studies    Patient Profile   85 y.o.femalewith a history of permanent atrial fibrillation diagnosed in December 2019 (also intermittent atrial flutter based on records) andhypertension admitted06/07with small bowel obstruction and sepsis secondary to pneumonia.  Assessment & Plan    1. Permanent afib/intermittent aflutter - issues with tachycardia in setting of severe systemic illness - low bp's on dilt gtt, changed to amio for rate control - she is on hep gtt  - rates have done well on amio. We will try starting lopressor '25mg'$  po q6 hrs now that her bp's are better and stop amion. SHe is permanent afib and amio only used for rate control in setting of low bp's    2.  Acute diastolic HF - 11/3760 echo: LVEF >65%, indeterminate diastolic function - she is on lasix IV '60mg'$  bid. Negative 728mL yesterday, +5L since admission. Cr stable, BUN trending up and hypernatremic. Rising BUN may be related to steroids  CVP of 9 in intubated would be considered normal - with hypernatremia and uptrending BUN will dose IV lasix just once this AM, d/c pm dose. With her dynamic LVOT gradient want to avoid hypovlemia. Her respiratory issues are more related to ARDS and pneumonia   3. Heart murmur secondary to hyperdynamic LVEF with small LVOT and turbulent flow associated with anterior systolic motion of the mitral valve. - see  echo report above - no additional eval needed at this time - need to watch volume  4. SBO - per primary team  5. Sepsis with pneumonia and respiratory failure with ARDS - per primary team and pulmonary - remains intubated, on ARDS protocol  - from pulm notes doing well with vent weaning, down to 40% FiO2.   6. Incidentally noted coronary and aortic calcifications by CT imaging. - no further eval at this time, f/u as outpt  7. Moderate pericardial effusion - noted on echo 03/2019, no evidence of tamponade - repeat echo in next few days  8. Mitral regurgitation - moderate by echo  For questions or updates, please contact [place] Please consult [email or web address] for contact info under        Signed, Carlyle Dolly, MD  04/04/2019, 9:35 AM

## 2019-04-05 ENCOUNTER — Inpatient Hospital Stay (HOSPITAL_COMMUNITY): Payer: Medicare Other

## 2019-04-05 DIAGNOSIS — Z7189 Other specified counseling: Secondary | ICD-10-CM

## 2019-04-05 DIAGNOSIS — I959 Hypotension, unspecified: Secondary | ICD-10-CM

## 2019-04-05 DIAGNOSIS — I313 Pericardial effusion (noninflammatory): Secondary | ICD-10-CM

## 2019-04-05 DIAGNOSIS — Z9911 Dependence on respirator [ventilator] status: Secondary | ICD-10-CM

## 2019-04-05 DIAGNOSIS — Z515 Encounter for palliative care: Secondary | ICD-10-CM

## 2019-04-05 LAB — BASIC METABOLIC PANEL
Anion gap: 11 (ref 5–15)
BUN: 93 mg/dL — ABNORMAL HIGH (ref 8–23)
CO2: 31 mmol/L (ref 22–32)
Calcium: 9.1 mg/dL (ref 8.9–10.3)
Chloride: 109 mmol/L (ref 98–111)
Creatinine, Ser: 1.17 mg/dL — ABNORMAL HIGH (ref 0.44–1.00)
GFR calc Af Amer: 48 mL/min — ABNORMAL LOW (ref >60)
GFR calc non Af Amer: 42 mL/min — ABNORMAL LOW (ref >60)
Glucose, Bld: 145 mg/dL — ABNORMAL HIGH (ref 70–99)
Potassium: 3.8 mmol/L (ref 3.5–5.1)
Sodium: 151 mmol/L — ABNORMAL HIGH (ref 135–145)

## 2019-04-05 LAB — ECHOCARDIOGRAM LIMITED
Height: 64 in
Weight: 1749.57 oz

## 2019-04-05 LAB — GLUCOSE, CAPILLARY
Glucose-Capillary: 127 mg/dL — ABNORMAL HIGH (ref 70–99)
Glucose-Capillary: 136 mg/dL — ABNORMAL HIGH (ref 70–99)
Glucose-Capillary: 133 mg/dL — ABNORMAL HIGH (ref 70–99)
Glucose-Capillary: 159 mg/dL — ABNORMAL HIGH (ref 70–99)
Glucose-Capillary: 157 mg/dL — ABNORMAL HIGH (ref 70–99)

## 2019-04-05 LAB — CBC
HCT: 37.2 % (ref 36.0–46.0)
Hemoglobin: 11.7 g/dL — ABNORMAL LOW (ref 12.0–15.0)
MCH: 30.2 pg (ref 26.0–34.0)
MCHC: 31.5 g/dL (ref 30.0–36.0)
MCV: 96.1 fL (ref 80.0–100.0)
Platelets: 185 10*3/uL (ref 150–400)
RBC: 3.87 MIL/uL (ref 3.87–5.11)
RDW: 15 % (ref 11.5–15.5)
WBC: 29.7 10*3/uL — ABNORMAL HIGH (ref 4.0–10.5)
nRBC: 0 % (ref 0.0–0.2)

## 2019-04-05 LAB — CORTISOL: Cortisol, Plasma: 12.7 ug/dL

## 2019-04-05 LAB — BLOOD GAS, ARTERIAL
Acid-Base Excess: 11.3 mmol/L — ABNORMAL HIGH (ref 0.0–2.0)
Bicarbonate: 34 mmol/L — ABNORMAL HIGH (ref 20.0–28.0)
FIO2: 50
O2 Saturation: 90.8 %
Patient temperature: 37.4
pCO2 arterial: 52.9 mmHg — ABNORMAL HIGH (ref 32.0–48.0)
pH, Arterial: 7.446 (ref 7.350–7.450)
pO2, Arterial: 62.6 mmHg — ABNORMAL LOW (ref 83.0–108.0)

## 2019-04-05 LAB — HEPARIN LEVEL (UNFRACTIONATED)
Heparin Unfractionated: 0.11 IU/mL — ABNORMAL LOW (ref 0.30–0.70)
Heparin Unfractionated: 0.41 IU/mL (ref 0.30–0.70)
Heparin Unfractionated: 0.38 IU/mL (ref 0.30–0.70)

## 2019-04-05 LAB — MAGNESIUM: Magnesium: 2.1 mg/dL (ref 1.7–2.4)

## 2019-04-05 LAB — PROCALCITONIN: Procalcitonin: 0.16 ng/mL

## 2019-04-05 MED ORDER — METOPROLOL TARTRATE 5 MG/5ML IV SOLN
2.5000 mg | INTRAVENOUS | Status: DC | PRN
  Administered 2019-04-05: 22:00:00 2.5 mg via INTRAVENOUS
  Administered 2019-04-07 – 2019-04-08 (×4): 5 mg via INTRAVENOUS
  Filled 2019-04-05 (×5): qty 5

## 2019-04-05 MED ORDER — FREE WATER
200.0000 mL | Freq: Three times a day (TID) | Status: DC
  Administered 2019-04-05 – 2019-04-08 (×9): 200 mL

## 2019-04-05 MED ORDER — SODIUM CHLORIDE 0.9 % IV BOLUS
125.0000 mL | Freq: Once | INTRAVENOUS | Status: AC
  Administered 2019-04-05: 125 mL via INTRAVENOUS

## 2019-04-05 NOTE — Progress Notes (Signed)
ANTICOAGULATION CONSULT NOTE - Follow Up Consult  Pharmacy Consult for heparin Indication: atrial fibrillation  HEPARIN DW (KG): 47.6   Labs: Recent Labs    04/03/19 0508  04/04/19 0408  04/04/19 2322 04/05/19 0407 04/05/19 1018 04/05/19 2006  HGB 12.6  --  11.9*  --   --  11.7*  --   --   HCT 40.5  --  38.7  --   --  37.2  --   --   PLT 228  --  211  --   --  185  --   --   HEPARINUNFRC 1.32*   < > 0.47   < > 0.11*  --  0.41 0.38  CREATININE 0.98  --  1.01*  --   --  1.17*  --   --    < > = values in this interval not displayed.    Assessment: 85yo female with history of atrial fibrillation. Heparin is therapeutic now at 0.38.   Goal of Therapy:  Heparin level 0.3-0.7 units/ml  Monitor platelets per protocol   Plan:  Continue heparin infusion at 850 units/hr. Heparin level daily while on heparin. Continue to monitor H&H and s/s of bleeding.

## 2019-04-05 NOTE — Progress Notes (Signed)
Pts son updated on pts status. All questions answered

## 2019-04-05 NOTE — Progress Notes (Signed)
Foam dressings on feet changed.

## 2019-04-05 NOTE — Progress Notes (Signed)
ANTICOAGULATION CONSULT NOTE - Follow Up Consult  Pharmacy Consult for heparin Indication: atrial fibrillation  HEPARIN DW (KG): 47.6   Labs: Recent Labs    04/03/19 0508  04/04/19 0408 04/04/19 1400 04/04/19 2322 04/05/19 0407 04/05/19 1018  HGB 12.6  --  11.9*  --   --  11.7*  --   HCT 40.5  --  38.7  --   --  37.2  --   PLT 228  --  211  --   --  185  --   HEPARINUNFRC 1.32*   < > 0.47 2.20* 0.11*  --  0.41  CREATININE 0.98  --  1.01*  --   --  1.17*  --    < > = values in this interval not displayed.    Assessment: 85yo female with history of atrial fibrillation. Heparin is therapeutic now at 0.41.   Goal of Therapy:  Heparin level 0.3-0.7 units/ml  Monitor platelets per protocol   Plan:  Continue heparin infusion at 850 units/hr. Heparin level in 8 hours and daily while on heparin. Continue to monitor H&H and s/s of bleeding.

## 2019-04-05 NOTE — Progress Notes (Signed)
Progress Note  Patient Name: Carrie Morales Date of Encounter: 04/05/2019  Primary Cardiologist: Dr Darral Dash  Subjective   No major events BP running low Spoke with only son Tressie Ellis at length  Inpatient Medications    Scheduled Meds:  chlorhexidine gluconate (MEDLINE KIT)  15 mL Mouth Rinse BID   Chlorhexidine Gluconate Cloth  6 each Topical Daily   feeding supplement (OSMOLITE 1.5 CAL)  1,000 mL Per Tube Q24H   feeding supplement (PRO-STAT SUGAR FREE 64)  30 mL Per Tube TID   free water  200 mL Per Tube Q8H   insulin aspart  5 Units Subcutaneous Q4H   levalbuterol  0.63 mg Nebulization TID   mouth rinse  15 mL Mouth Rinse 10 times per day   methylPREDNISolone (SOLU-MEDROL) injection  40 mg Intravenous Q12H   metoprolol tartrate  5 mg Intravenous Q8H   metoprolol tartrate  25 mg Oral Q6H   pantoprazole (PROTONIX) IV  40 mg Intravenous Q24H   sodium chloride flush  10-40 mL Intracatheter Q12H   sodium chloride flush  3 mL Intravenous Q12H   Continuous Infusions:  sodium chloride 250 mL (04/05/19 0455)   fentaNYL infusion INTRAVENOUS 50 mcg/hr (04/05/19 0613)   heparin 850 Units/hr (04/05/19 0450)   PRN Meds: sodium chloride, acetaminophen **OR** acetaminophen, albuterol, bisacodyl, fentaNYL, midazolam, midazolam, ondansetron **OR** ondansetron (ZOFRAN) IV, phenol, polyethylene glycol, sodium chloride flush, sodium chloride flush, traZODone   Vital Signs    Vitals:   04/05/19 0600 04/05/19 0700 04/05/19 0705 04/05/19 0800  BP: (!) 120/58 (!) 104/52  (!) 98/50  Pulse:      Resp:  (!) 24  (!) 24  Temp: 99.1 F (37.3 C) 98.6 F (37 C)  99 F (37.2 C)  TempSrc:      SpO2: 97% (!) 87% 94% 93%  Weight:      Height:        Intake/Output Summary (Last 24 hours) at 04/05/2019 0835 Last data filed at 04/05/2019 0506 Gross per 24 hour  Intake 1958.72 ml  Output 1450 ml  Net 508.72 ml   Last 3 Weights 04/04/2019 04/03/2019 04/02/2019  Weight (lbs)  109 lb 5.6 oz 115 lb 11.9 oz 117 lb 11.6 oz  Weight (kg) 49.6 kg 52.5 kg 53.4 kg      Telemetry    afib variable rates- Personally Reviewed  ECG    n/a - Personally Reviewed  Physical Exam   Frail pale elderly female JVP normal  SEM known Post SBO surgery  PT palpable  Lungs clear anteriorly   Labs    Chemistry Recent Labs  Lab 04/03/19 0508 04/04/19 0408 04/05/19 0407  NA 148* 149* 151*  K 3.1* 3.3* 3.8  CL 106 105 109  CO2 '30 28 31  '$ GLUCOSE 166* 196* 145*  BUN 75* 80* 93*  CREATININE 0.98 1.01* 1.17*  CALCIUM 9.5 9.1 9.1  PROT  --  4.6*  --   ALBUMIN  --  1.9*  --   AST  --  50*  --   ALT  --  93*  --   ALKPHOS  --  76  --   BILITOT  --  0.5  --   GFRNONAA 52* 50* 42*  GFRAA 60* 58* 48*  ANIONGAP 12 16* 11     Hematology Recent Labs  Lab 04/03/19 0508 04/04/19 0408 04/05/19 0407  WBC 20.6* 27.6* 29.7*  RBC 4.24 4.04 3.87  HGB 12.6 11.9* 11.7*  HCT 40.5 38.7 37.2  MCV 95.5 95.8 96.1  MCH 29.7 29.5 30.2  MCHC 31.1 30.7 31.5  RDW 14.6 14.5 15.0  PLT 228 211 185    Cardiac EnzymesNo results for input(s): TROPONINI in the last 168 hours. No results for input(s): TROPIPOC in the last 168 hours.   BNPNo results for input(s): BNP, PROBNP in the last 168 hours.   DDimer No results for input(s): DDIMER in the last 168 hours.   Radiology    Dg Chest Port 1 View  Result Date: 04/05/2019 CLINICAL DATA:  Respiratory failure, hypertension, atrial fibrillation, former smoker EXAM: PORTABLE CHEST 1 VIEW COMPARISON:  Portable exam 0520 hours compared to 04/04/2019 FINDINGS: Tip of endotracheal tube projects 2.5 cm above carina. Nasogastric tube extends into stomach. LEFT arm PICC line with tip projecting over RIGHT atrium. Enlargement of cardiac silhouette with pulmonary vascular congestion. Persistent perihilar to basilar infiltrates bilaterally. Persistent LEFT pleural effusion. No pneumothorax. IMPRESSION: Persistent pulmonary infiltrates and LEFT pleural  effusion. Enlargement of cardiac silhouette with pulmonary vascular congestion. Electronically Signed   By: Lavonia Dana M.D.   On: 04/05/2019 08:20   Dg Chest Port 1 View  Result Date: 04/04/2019 CLINICAL DATA:  Respiratory failure.  Atrial fibrillation EXAM: PORTABLE CHEST 1 VIEW COMPARISON:  04/03/2019 FINDINGS: Multiple tube is a overlies the midline chest making it difficult to be certain endotracheal placement. I cannot tell whether the endotracheal tube is above the carina or in the right mainstem bronchus. Persistent bilateral pulmonary infiltrates left worse than right. Possible mild improvement on the left and worsening on right. There may be an element associated edema as well. Small effusions left more than right. Left arm PICC tip unchanged in the SVC. IMPRESSION: Slight improvement of left lung infiltrates. Slight worsening of right lung infiltrates. Possible mild edema. Small effusions left right. Cannot be sure of endotracheal position due to multiple overlying artifacts. Electronically Signed   By: Nelson Chimes M.D.   On: 04/04/2019 09:11   Dg Chest Port 1 View  Result Date: 04/03/2019 CLINICAL DATA:  Respiratory failure, hypertension, atrial fibrillation EXAM: PORTABLE CHEST 1 VIEW COMPARISON:  Portable exam 1033 hours compared to 04/02/2019 FINDINGS: LEFT arm PICC line tip projects over SVC. Tip of endotracheal tube projects 4.1 cm above carina. Enlargement of cardiac silhouette. Airspace infiltrates identified throughout mid to lower LEFT lung and at RIGHT base consistent with multifocal pneumonia. Less RIGHT perihilar infiltrate compared to previous exam. Persistent LEFT pleural effusion. No pneumothorax. Bones demineralized. IMPRESSION: Persistent BILATERAL pulmonary infiltrates and LEFT pleural effusion. Electronically Signed   By: Lavonia Dana M.D.   On: 04/03/2019 12:52    Cardiac Studies    Patient Profile   85 y.o.femalewith a history of permanent atrial fibrillation  diagnosed in December 2019 (also intermittent atrial flutter based on records) andhypertension admitted06/07with small bowel obstruction and sepsis secondary to pneumonia.  Assessment & Plan    1. Permanent afib/intermittent aflutter - issues with tachycardia in setting of severe systemic illness - low bp's on dilt gtt, changed to amio for rate control - she is on hep gt  2. Acute diastolic HF - 10/3242 echo: LVEF >65%, indeterminate diastolic function - CVP 9 holding lasix due to low BP    3. Heart murmur secondary to hyperdynamic LVEF with small LVOT and turbulent flow associated with anterior systolic motion of the mitral valve. - repeat echo for pericardial effusion seen on echo done 03/26/19 limited   4. SBO - per primary team  5. Sepsis with pneumonia  and respiratory failure with ARDS - per primary team and pulmonary - remains intubated, on ARDS protocol  - from pulm notes doing well with vent weaning, down to 40% FiO2.   6. Incidentally noted coronary and aortic calcifications by CT imaging. - no further eval at this time, f/u as outpt    For questions or updates, please contact [facility] Please consult [email or web address] for contact info under        Signed, Jenkins Rouge, MD  04/05/2019, 8:35 AM

## 2019-04-05 NOTE — Progress Notes (Signed)
*  PRELIMINARY RESULTS* Echocardiogram Limited Echocardiogram has been performed.  Carrie Morales 04/05/2019, 11:34 AM

## 2019-04-05 NOTE — Progress Notes (Signed)
ANTICOAGULATION CONSULT NOTE   Pharmacy Consult for heparin Indication: atrial fibrillation  HEPARIN DW (KG): 47.6   Labs: Recent Labs    04/02/19 0414 04/03/19 0508  04/04/19 0408 04/04/19 1400 04/04/19 2322  HGB  --  12.6  --  11.9*  --   --   HCT  --  40.5  --  38.7  --   --   PLT  --  228  --  211  --   --   HEPARINUNFRC 0.67 1.32*   < > 0.47 2.20* 0.11*  CREATININE 1.06* 0.98  --  1.01*  --   --    < > = values in this interval not displayed.    Assessment: 85 y.o. female with h/o Afib, Eliquis on hold, for heparin.  Goal of Therapy:  Heparin level 0.3-0.7 units/ml  Monitor platelets per protocol   Plan:  Increase Heparin 850 units/hr Follow-up am labs.   Phillis Knack, PharmD, BCPS  04/05/2019 1:14 AM

## 2019-04-05 NOTE — Progress Notes (Signed)
Subjective: She remains intubated and on the ventilator.  She had some trouble with blood pressure last night and received a small bolus.  Her sodium level is up a little bit.  She has been somewhat hypotensive.  She does not have a lot of secretions now.  She has had some loose stools but has finished her course of antibiotics  Objective: Vital signs in last 24 hours: Temp:  [98.6 F (37 C)-99.9 F (37.7 C)] 99 F (37.2 C) (06/18 0800) Pulse Rate:  [94-104] 104 (06/17 1618) Resp:  [17-39] 24 (06/18 0800) BP: (79-190)/(42-116) 98/50 (06/18 0800) SpO2:  [86 %-99 %] 93 % (06/18 0800) FiO2 (%):  [50 %] 50 % (06/18 0318) Weight change:  Last BM Date: 04/04/19  Intake/Output from previous day: 06/17 0701 - 06/18 0700 In: 1958.7 [I.V.:447.3; NG/GT:1380.8; IV Piggyback:130.6] Out: 1450 [Urine:1450]  PHYSICAL EXAM General appearance: alert, cooperative and no distress Resp: rales bilaterally and rhonchi bilaterally Cardio: Heart is still irregular with a systolic murmur GI: soft, non-tender; bowel sounds normal; no masses,  no organomegaly Extremities: She still has some third spacing of fluid  Lab Results:  Results for orders placed or performed during the hospital encounter of 03/30/2019 (from the past 48 hour(s))  Heparin level (unfractionated)     Status: Abnormal   Collection Time: 04/03/19  8:22 AM  Result Value Ref Range   Heparin Unfractionated 1.32 (H) 0.30 - 0.70 IU/mL    Comment: RESULTS CONFIRMED BY MANUAL DILUTION (NOTE) If heparin results are below expected values, and patient dosage has  been confirmed, suggest follow up testing of antithrombin III levels. Performed at [facility], [address] [place], [place] [address]   Glucose, capillary     Status: Abnormal   Collection Time: 04/03/19 11:29 AM  Result Value Ref Range   Glucose-Capillary 140 (H) 70 - 99 mg/dL  Glucose, capillary     Status: Abnormal   Collection Time: 04/03/19  4:23 PM  Result Value Ref  Range   Glucose-Capillary 139 (H) 70 - 99 mg/dL  Heparin level (unfractionated)     Status: Abnormal   Collection Time: 04/03/19  6:49 PM  Result Value Ref Range   Heparin Unfractionated 1.22 (H) 0.30 - 0.70 IU/mL    Comment: RESULTS CONFIRMED BY MANUAL DILUTION Performed at [facility], [address], [place], [place] [address]   Glucose, capillary     Status: Abnormal   Collection Time: 04/03/19  9:25 PM  Result Value Ref Range   Glucose-Capillary 137 (H) 70 - 99 mg/dL  Glucose, capillary     Status: Abnormal   Collection Time: 04/03/19 11:53 PM  Result Value Ref Range   Glucose-Capillary 129 (H) 70 - 99 mg/dL  Basic metabolic panel     Status: Abnormal   Collection Time: 04/04/19  4:08 AM  Result Value Ref Range   Sodium 149 (H) 135 - 145 mmol/L   Potassium 3.3 (L) 3.5 - 5.1 mmol/L   Chloride 105 98 - 111 mmol/L   CO2 28 22 - 32 mmol/L   Glucose, Bld 196 (H) 70 - 99 mg/dL   BUN 80 (H) 8 - 23 mg/dL   Creatinine, Ser 1.01 (H) 0.44 - 1.00 mg/dL   Calcium 9.1 8.9 - 10.3 mg/dL   GFR calc non Af Amer 50 (L) >60 mL/min   GFR calc Af Amer 58 (L) >60 mL/min   Anion gap 16 (H) 5 - 15    Comment: Performed at [facility],  [address] [place], [place] [address]  Heparin level (unfractionated)     Status: None   Collection Time: 04/04/19  4:08 AM  Result Value Ref Range   Heparin Unfractionated 0.47 0.30 - 0.70 IU/mL    Comment: (NOTE) If heparin results are below expected values, and patient dosage has  been confirmed, suggest follow up testing of antithrombin III levels. Performed at [facility], [address], [place], [place] [address]   CBC     Status: Abnormal   Collection Time: 04/04/19  4:08 AM  Result Value Ref Range   WBC 27.6 (H) 4.0 - 10.5 K/uL   RBC 4.04 3.87 - 5.11 MIL/uL   Hemoglobin 11.9 (L) 12.0 - 15.0 g/dL   HCT 38.7 36.0 - 46.0 %   MCV 95.8 80.0 - 100.0 fL   MCH 29.5 26.0 - 34.0 pg   MCHC 30.7 30.0 - 36.0 g/dL   RDW 14.5 11.5 - 15.5 %   Platelets 211  150 - 400 K/uL   nRBC 0.0 0.0 - 0.2 %    Comment: Performed at [facility], [address] [place], [place] [address]  Magnesium     Status: None   Collection Time: 04/04/19  4:08 AM  Result Value Ref Range   Magnesium 2.0 1.7 - 2.4 mg/dL    Comment: Performed at [facility], [address], [place], [place] [address]  Hepatic function panel     Status: Abnormal   Collection Time: 04/04/19  4:08 AM  Result Value Ref Range   Total Protein 4.6 (L) 6.5 - 8.1 g/dL   Albumin 1.9 (L) 3.5 - 5.0 g/dL   AST 50 (H) 15 - 41 U/L   ALT 93 (H) 0 - 44 U/L   Alkaline Phosphatase 76 38 - 126 U/L   Total Bilirubin 0.5 0.3 - 1.2 mg/dL   Bilirubin, Direct 0.1 0.0 - 0.2 mg/dL   Indirect Bilirubin 0.4 0.3 - 0.9 mg/dL    Comment: Performed at [facility], [address], [place], [place] [address]  Glucose, capillary     Status: Abnormal   Collection Time: 04/04/19  4:16 AM  Result Value Ref Range   Glucose-Capillary 204 (H) 70 - 99 mg/dL  Blood gas, arterial     Status: Abnormal   Collection Time: 04/04/19  4:23 AM  Result Value Ref Range   FIO2 50.00    Delivery systems VENTILATOR    Mode PRESSURE REGULATED VOLUME CONTROL    VT 430 mL   LHR 24 resp/min   Peep/cpap 5.0 cm H20   pH, Arterial 7.495 (H) 7.350 - 7.450   pCO2 arterial 46.7 32.0 - 48.0 mmHg   pO2, Arterial 62.4 (L) 83.0 - 108.0 mmHg   Bicarbonate 34.6 (H) 20.0 - 28.0 mmol/L   Acid-Base Excess 11.6 (H) 0.0 - 2.0 mmol/L   O2 Saturation 92.1 %   Patient temperature 37.0    Collection site RIGHT RADIAL    Drawn by [id number]    Allens test (pass/fail) PASS PASS    Comment: Performed at [facility], [address] [place],  [address]  Glucose, capillary     Status: Abnormal   Collection Time: 04/04/19  7:32 AM  Result Value Ref Range   Glucose-Capillary 109 (H) 70 - 99 mg/dL  Glucose, capillary     Status: Abnormal   Collection Time: 04/04/19 11:08 AM  Result Value Ref Range   Glucose-Capillary 121 (H) 70 - 99 mg/dL  Heparin  level (unfractionated)  Status: Abnormal   Collection Time: 04/04/19  2:00 PM  Result Value Ref Range   Heparin Unfractionated 2.20 (H) 0.30 - 0.70 IU/mL    Comment: RESULTS CONFIRMED BY MANUAL DILUTION (NOTE) If heparin results are below expected values, and patient dosage has  been confirmed, suggest follow up testing of antithrombin III levels. Performed at [facility], [address], [place], [place] [address]   Glucose, capillary     Status: Abnormal   Collection Time: 04/04/19  4:24 PM  Result Value Ref Range   Glucose-Capillary 124 (H) 70 - 99 mg/dL  Glucose, capillary     Status: Abnormal   Collection Time: 04/04/19  8:02 PM  Result Value Ref Range   Glucose-Capillary 123 (H) 70 - 99 mg/dL  Heparin level (unfractionated)     Status: Abnormal   Collection Time: 04/04/19 11:22 PM  Result Value Ref Range   Heparin Unfractionated 0.11 (L) 0.30 - 0.70 IU/mL    Comment: (NOTE) If heparin results are below expected values, and patient dosage has  been confirmed, suggest follow up testing of antithrombin III levels. Performed at [facility], [address], [place], [place] [address]   Glucose, capillary     Status: Abnormal   Collection Time: 04/04/19 11:47 PM  Result Value Ref Range   Glucose-Capillary 129 (H) 70 - 99 mg/dL  CBC     Status: Abnormal   Collection Time: 04/05/19  4:07 AM  Result Value Ref Range   WBC 29.7 (H) 4.0 - 10.5 K/uL   RBC 3.87 3.87 - 5.11 MIL/uL   Hemoglobin 11.7 (L) 12.0 - 15.0 g/dL   HCT 37.2 36.0 - 46.0 %   MCV 96.1 80.0 - 100.0 fL   MCH 30.2 26.0 - 34.0 pg   MCHC 31.5 30.0 - 36.0 g/dL   RDW 15.0 11.5 - 15.5 %   Platelets 185 150 - 400 K/uL   nRBC 0.0 0.0 - 0.2 %    Comment: Performed at [facility], [address], [place], [place] [address]  Basic metabolic panel     Status: Abnormal   Collection Time: 04/05/19  4:07 AM  Result Value Ref Range   Sodium 151 (H) 135 - 145 mmol/L   Potassium 3.8 3.5 - 5.1 mmol/L   Chloride 109 98 - 111  mmol/L   CO2 31 22 - 32 mmol/L   Glucose, Bld 145 (H) 70 - 99 mg/dL   BUN 93 (H) 8 - 23 mg/dL   Creatinine, Ser 1.17 (H) 0.44 - 1.00 mg/dL   Calcium 9.1 8.9 - 10.3 mg/dL   GFR calc non Af Amer 42 (L) >60 mL/min   GFR calc Af Amer 48 (L) >60 mL/min   Anion gap 11 5 - 15    Comment: Performed at [facility], [address], [place], [place] [address]  Magnesium     Status: None   Collection Time: 04/05/19  4:07 AM  Result Value Ref Range   Magnesium 2.1 1.7 - 2.4 mg/dL    Comment: Performed at [facility], [address] [place],  [address]  Glucose, capillary     Status: Abnormal   Collection Time: 04/05/19  4:34 AM  Result Value Ref Range   Glucose-Capillary 127 (H) 70 - 99 mg/dL  Blood gas, arterial     Status: Abnormal   Collection Time: 04/05/19  5:00 AM  Result Value Ref Range   FIO2 50.00    pH, Arterial 7.446 7.350 - 7.450  pCO2 arterial 52.9 (H) 32.0 - 48.0 mmHg   pO2, Arterial 62.6 (L) 83.0 - 108.0 mmHg   Bicarbonate 34.0 (H) 20.0 - 28.0 mmol/L   Acid-Base Excess 11.3 (H) 0.0 - 2.0 mmol/L   O2 Saturation 90.8 %   Patient temperature 37.4    Allens test (pass/fail) PASS PASS    Comment: Performed at [facility], [address], [place], [place] [address]  Glucose, capillary     Status: Abnormal   Collection Time: 04/05/19  7:49 AM  Result Value Ref Range   Glucose-Capillary 136 (H) 70 - 99 mg/dL    ABGS Recent Labs    04/05/19 0500  PHART 7.446  PO2ART 62.6*  HCO3 34.0*   CULTURES No results found for this or any previous visit (from the past 240 hour(s)). Studies/Results: Dg Chest Port 1 View  Result Date: 04/04/2019 CLINICAL DATA:  Respiratory failure.  Atrial fibrillation EXAM: PORTABLE CHEST 1 VIEW COMPARISON:  04/03/2019 FINDINGS: Multiple tube is a overlies the midline chest making it difficult to be certain endotracheal placement. I cannot tell whether the endotracheal tube is above the carina or in the right mainstem bronchus. Persistent  bilateral pulmonary infiltrates left worse than right. Possible mild improvement on the left and worsening on right. There may be an element associated edema as well. Small effusions left more than right. Left arm PICC tip unchanged in the SVC. IMPRESSION: Slight improvement of left lung infiltrates. Slight worsening of right lung infiltrates. Possible mild edema. Small effusions left right. Cannot be sure of endotracheal position due to multiple overlying artifacts. Electronically Signed   By: Nelson Chimes M.D.   On: 04/04/2019 09:11   Dg Chest Port 1 View  Result Date: 04/03/2019 CLINICAL DATA:  Respiratory failure, hypertension, atrial fibrillation EXAM: PORTABLE CHEST 1 VIEW COMPARISON:  Portable exam 1033 hours compared to 04/02/2019 FINDINGS: LEFT arm PICC line tip projects over SVC. Tip of endotracheal tube projects 4.1 cm above carina. Enlargement of cardiac silhouette. Airspace infiltrates identified throughout mid to lower LEFT lung and at RIGHT base consistent with multifocal pneumonia. Less RIGHT perihilar infiltrate compared to previous exam. Persistent LEFT pleural effusion. No pneumothorax. Bones demineralized. IMPRESSION: Persistent BILATERAL pulmonary infiltrates and LEFT pleural effusion. Electronically Signed   By: Lavonia Dana M.D.   On: 04/03/2019 12:52    Medications:  Prior to Admission:  Medications Prior to Admission  Medication Sig Dispense Refill Last Dose  . apixaban (ELIQUIS) 2.5 MG TABS tablet Take 2.5 mg by mouth 2 (two) times daily.    03/24/2019 at Unknown time  . aspirin 81 MG tablet Take 81 mg by mouth daily.   unknown  . Calcium Carbonate-Vitamin D (CALCIUM + D PO) Take 1 tablet by mouth daily.    unknown  . digoxin (LANOXIN) 0.125 MG tablet Take 0.125 mg by mouth daily.   03/24/2019 at Unknown time  . furosemide (LASIX) 20 MG tablet Take 20 mg by mouth See admin instructions. Take one tablet by mouth daily 5 days per week   03/23/2019 at Unknown time  . losartan (COZAAR)  50 MG tablet Take 50 mg by mouth daily.    03/24/2019 at Unknown time  . metoprolol tartrate (LOPRESSOR) 100 MG tablet Take 100 mg by mouth 2 (two) times daily.    03/24/2019 at Unknown time  . Multiple Vitamin (MULTIVITAMIN) tablet Take 1 tablet by mouth daily.   03/24/2019  . ondansetron (ZOFRAN) 4 MG tablet Take 4 mg by mouth daily as needed  for nausea or vomiting.   03/24/2019 at Unknown time  . potassium chloride (K-DUR) 10 MEQ tablet Take 10 mEq by mouth See admin instructions. Take one tablet daily by mouth 5 days per week   03/23/2019 at Unknown time  . Cholecalciferol (VITAMIN D PO) Take 1 capsule by mouth daily.    03/24/2019   Scheduled: . chlorhexidine gluconate (MEDLINE KIT)  15 mL Mouth Rinse BID  . Chlorhexidine Gluconate Cloth  6 each Topical Daily  . feeding supplement (OSMOLITE 1.5 CAL)  1,000 mL Per Tube Q24H  . feeding supplement (PRO-STAT SUGAR FREE 64)  30 mL Per Tube TID  . free water  200 mL Per Tube Q8H  . insulin aspart  5 Units Subcutaneous Q4H  . levalbuterol  0.63 mg Nebulization TID  . mouth rinse  15 mL Mouth Rinse 10 times per day  . methylPREDNISolone (SOLU-MEDROL) injection  40 mg Intravenous Q12H  . metoprolol tartrate  5 mg Intravenous Q8H  . metoprolol tartrate  25 mg Oral Q6H  . pantoprazole (PROTONIX) IV  40 mg Intravenous Q24H  . sodium chloride flush  10-40 mL Intracatheter Q12H  . sodium chloride flush  3 mL Intravenous Q12H   Continuous: . sodium chloride 250 mL (04/05/19 0455)  . fentaNYL infusion INTRAVENOUS 50 mcg/hr (04/05/19 0141)  . heparin 850 Units/hr (04/05/19 0450)   CVU:DTHYHO chloride, acetaminophen **OR** acetaminophen, albuterol, bisacodyl, fentaNYL, midazolam, midazolam, ondansetron **OR** ondansetron (ZOFRAN) IV, phenol, polyethylene glycol, sodium chloride flush, sodium chloride flush, traZODone  Assesment: She was admitted with small bowel obstruction.  She had been improving when she developed respiratory distress and hypoxia.  She had  marked bilateral infiltrates with almost complete white out of the left lung.  This culminated in her being intubated and placed on mechanical ventilation.  She initially was treated with ARDS protocol but has been switched to standard respiratory protocol now.  She still has bilateral infiltrates.  On admission she had pneumonia felt to be related to aspiration and she may have aspirated again prior to her intubation.  She had some element of volume overload but that seems to be less of an issue now.  She has permanent atrial fib and she had been on amiodarone for rate control and was switched to Lopressor yesterday.  She has had a little bit more trouble with soft blood pressure since she got switched to Lopressor.  She has diarrhea but may not have C. difficile.  She will be checked if it continues.  She has completed the course of antibiotics Principal Problem:   SBO (small bowel obstruction) (HCC) Active Problems:   Essential hypertension   Chronic a-fib   PNA (pneumonia)--Aspiration   Respiratory failure (HCC)   Acute diastolic CHF (congestive heart failure) (HCC)   Atrial fibrillation with RVR (HCC)   Elevated troponin   Lobar pneumonia (HCC)   Coronary artery disease due to lipid rich plaque   Hypotension    Plan: Discussed with son at bedside he is interested in seeing what she can do breathing on her own.  She is not ready for extubation but I do not see any reason not to let her try to breathe on her own some and see how she does    LOS: 11 days   Alonza Bogus 04/05/2019, 8:20 AM

## 2019-04-05 NOTE — Consult Note (Signed)
Consultation Note Date: 04/05/2019   Patient Name: Carrie Morales  DOB: 05/24/1928  MRN: [id number]  Age / Sex: 85 y.o., female  PCP: Earney Mallet, MD Referring Physician: Rodena Goldmann, DO  Reason for Consultation: Establishing goals of care  HPI/Patient Profile: 85 y.o. female  with past medical history of atrial fibrillation, HTN, colonic diverticular abscess, osteopenia admitted on 04/08/2019 with constipation and vomiting and found to have high grade SBO along with likely aspiration pneumonia with sepsis. She ultimately required intubation and has not made much progress to wean. She has struggled with hypotension limiting control of atrial fibrillation and diuresis.   Clinical Assessment and Goals of Care: I met today at Carrie Morales's bedside. She is alert and awake. She follows all commands and waves and gives me thumbs up. She winks and laughs during our interaction. She is tolerating vent extremely well.   I spoke with her son, Carrie Morales, privately. 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 He shares that his father died 59 2018-10-24 and that he needed full care for ~13 months that his mother with his assistance provided. 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 The day after his father's funeral she was diagnosed with atrial fibrillation and was having progressive SOB. Reportedly took some time and multiple medication changes to get this better controlled. She was doing well until she became constipated which lead to a SBO, pneumonia, ventilator, and atrial fibrillation again out of control.   He knows that the 13 months of caring for his father took a toll on his mother. However, he says that he has asked her if she is ready to go be  with his father and she says no. He does not feel she is finished fighting. I acknowledge that I do believe this to be true but also acknowledge my concern if she were to worsen how we should handle that situation. I recommended continuing with current therapies with hope of successful extubation but considering that if this does not happen or if she extubates and fails that it may not be helpful to move backwards and reintubate. Furthermore, we discussed CPR and he was encouraged to make her full code and says he was told "I think we can save her." We discussed the repercussions of CPR and if her breathing is already so compromised that performing CPR and likely rib fractures would only make her chances of any improvement extremely unlikely. He plans to think further and talk further with his family regarding these decisions.   At this time [name] and son remain hopeful. They will need continued conversations regarding [facility].   Discussed with Dr. Manuella Ghazi.   Primary Decision Maker HCPOA son Carrie Morales along with patient    SUMMARY OF RECOMMENDATIONS   - Hopeful for improvement but considering options/limitations if she declines or continues  without any further improvement  Code Status/Advance Care Planning:  Full code - considering options   Symptom Management:   Per attending and pulmonology.   Palliative Prophylaxis:   Aspiration, Bowel Regimen, Delirium Protocol, Frequent Pain Assessment, Oral Care and Turn Reposition  Additional Recommendations (Limitations, Scope, Preferences):  Full Scope Treatment  Psycho-social/Spiritual:   Desire for further Chaplaincy support:no  Additional Recommendations: Compassionate Wean Education  Prognosis:   Overall prognosis poor with advanced age and lack of progress/improvements.   Discharge Planning: To Be Determined. Son motivated to help her to return home eventually even if she needs 24/7 caregivers.       Primary Diagnoses:  Present on Admission: . Essential hypertension . Chronic a-fib . PNA (pneumonia)--Aspiration   I have reviewed the medical record, interviewed the patient and family, and examined the patient. The following aspects are pertinent.  Past Medical History:  Diagnosis Date  . Colonic diverticular abscess   . Essential hypertension   . Osteopenia    09/2014 tab T score -1.1  . Permanent atrial fibrillation    Diagnosed December 2019 - Dr. Darral Dash   Social History   Socioeconomic History  . Marital status: Married    Spouse name: Not on file  . Number of children: Not on file  . Years of education: Not on file  . Highest education level: Not on file  Occupational History  . Not on file  Social Needs  . Financial resource strain: Not on file  . Food insecurity    Worry: Not on file    Inability: Not on file  . Transportation needs    Medical: Not on file    Non-medical: Not on file  Tobacco Use  . Smoking status: Former Research scientist (life sciences)  . Smokeless tobacco: Never Used  Substance and Sexual Activity  . Alcohol use: Not Currently    Comment: Occas  . Drug use: Not on file  . Sexual activity: Never  Lifestyle  . Physical activity    Days per week: Not on file    Minutes per session: Not on file  . Stress: Not on file  Relationships  . Social Herbalist on phone: Not on file    Gets together: Not on file    Attends religious service: Not on file    Active member of club or organization: Not on file    Attends meetings of clubs or organizations: Not on file    Relationship status: Not on file  Other Topics Concern  . Not on file  Social History Narrative  . Not on file   Family History  Problem Relation Age of Onset  . Cancer Brother        Lung  . Cancer Brother        Bladder   Scheduled Meds: . chlorhexidine gluconate (MEDLINE KIT)  15 mL Mouth Rinse BID  . Chlorhexidine Gluconate Cloth  6 each Topical Daily  . feeding supplement (OSMOLITE 1.5 CAL)   1,000 mL Per Tube Q24H  . feeding supplement (PRO-STAT SUGAR FREE 64)  30 mL Per Tube TID  . free water  200 mL Per Tube Q8H  . insulin aspart  5 Units Subcutaneous Q4H  . levalbuterol  0.63 mg Nebulization TID  . mouth rinse  15 mL Mouth Rinse 10 times per day  . methylPREDNISolone (SOLU-MEDROL) injection  40 mg Intravenous Q12H  . metoprolol tartrate  25 mg Oral Q6H  . pantoprazole (PROTONIX) IV  40 mg  Intravenous Q24H  . sodium chloride flush  10-40 mL Intracatheter Q12H  . sodium chloride flush  3 mL Intravenous Q12H   Continuous Infusions: . sodium chloride 250 mL (04/05/19 0455)  . fentaNYL infusion INTRAVENOUS 50 mcg/hr (04/05/19 2589)  . heparin 850 Units/hr (04/05/19 0450)   PRN Meds:.sodium chloride, acetaminophen **OR** acetaminophen, albuterol, bisacodyl, fentaNYL, metoprolol tartrate, midazolam, midazolam, ondansetron **OR** ondansetron (ZOFRAN) IV, phenol, polyethylene glycol, sodium chloride flush, sodium chloride flush, traZODone Allergies  Allergen Reactions  . Codeine   . Penicillins   . Sulfa Antibiotics    Review of Systems  Unable to perform ROS: Intubated  Constitutional:       She gives thumbs up that she is doing okay and no complaints    Physical Exam Vitals signs and nursing note reviewed.  Constitutional:      General: She is not in acute distress.    Interventions: She is intubated.     Comments: Frail, elderly  Cardiovascular:     Rate and Rhythm: Tachycardia present. Rhythm irregularly irregular.  Pulmonary:     Effort: No tachypnea, accessory muscle usage or respiratory distress. She is intubated.     Comments: Tolerating wean well so far - monitoring HR carefully during wean Abdominal:     Palpations: Abdomen is soft.  Neurological:     Mental Status: She is alert.     Comments: Follows all commands and seems completely oriented as far as I can tell     Vital Signs: BP 119/83   Pulse (!) 119   Temp 99.3 F (37.4 C)   Resp (!) 28    Ht _0  (1.626 m)   Wt 49.6 kg   SpO2 98%   BMI 18.77 kg/m  Pain Scale: 0-10 POSS *See Group Information*: 1-Acceptable,Awake and alert Pain Score: 0-No pain   SpO2: SpO2: 98 % O2 Device:SpO2: 98 % O2 Flow Rate: .O2 Flow Rate (L/min): 50 L/min  IO: Intake/output summary:   Intake/Output Summary (Last 24 hours) at 04/05/2019 1149 Last data filed at 04/05/2019 1058 Gross per 24 hour  Intake 1880.01 ml  Output 1450 ml  Net 430.01 ml    LBM: Last BM Date: 04/04/19 Baseline Weight: Weight: 52.2 kg Most recent weight: Weight: 49.6 kg     Palliative Assessment/Data:     Time In: 1140 Time Out: 1230 Time Total: 50 min Greater than 50%  of this time was spent counseling and coordinating care related to the above assessment and plan.  Signed by: Vinie Sill, NP Palliative Medicine Team Pager # [phone] (M-F 8a-5p) Team Phone # [phone] (Nights/Weekends)

## 2019-04-05 NOTE — Progress Notes (Signed)
PROGRESS NOTE    Carrie Morales  XBM:[id number] DOB: 1928-08-07 DOA: 04/11/2019 PCP: Earney Mallet, MD   Brief Narrative:  Per HPI: 85 year old female with a history of chronic atrial fibrillation on apixaban, diastolic CHF, hypertension, bowel obstruction status post bowel resection 30 years ago presenting with 1 week history of abdominal pain that worsened over the past 2 to 3 days to the point of resulting in nausea and vomiting. The patient was also having constipation-like symptoms and tried some over-the-counter laxatives without relief. Because of her progressive symptoms she presented for further evaluation. CT of the abdomen and pelvis in the ED showed left basilar collapse less consolidative changes, small bilateral pleural effusions, left greater than right with a large hiatus hernia. There was also dilatation of the proximal and mid small bowel loops up to 2.9 cm consistent with a high-grade SBO. The patient was treated with bowel rest and IV fluids. When she arrived to the medical floor, the patient developed atrial fibrillation with RVR and respiratory distress. She was transferred to the stepdown unit and started on diltiazem drip and placed on BiPAP. Subsequently, the patient became hypotensive requiring fluid resuscitation. Repeat chest x-ray show worsening bilateral perihilar densities with stable bibasilar opacifications. Lactic acid peaked at 4.4. The patient was started on ceftriaxone and metronidazole.  She has now completed course of treatment with Merrem and continues to remain on the ventilator due to hypoxemic and hypercapnic respiratory failure.  She has been weaned off amiodarone on 6/17 and remains in atrial fibrillation, but is rate controlled and on heparin drip.   Assessment & Plan:   Principal Problem:   SBO (small bowel obstruction) (HCC) Active Problems:   Essential hypertension   Chronic a-fib   PNA (pneumonia)--Aspiration   Respiratory  failure (HCC)   Acute diastolic CHF (congestive heart failure) (HCC)   Atrial fibrillation with RVR (HCC)   Elevated troponin   Lobar pneumonia (HCC)   Coronary artery disease due to lipid rich plaque   Hypotension   Sepsis secondary to pneumonia -Merrem has completed 6/17, but leukocytosis worsening -Procalcitonin low -Blood cultures with no growth and urine analysis negative  Acute hypoxemic and hypercapnic respiratory failure secondary to above -Associated ARDS as well as acute on chronic heart failure noted -Appreciate pulmonology assistance and recommendations with FiO2 50% % today. -Continue Solu-Medrol 40 mg IV twice daily as well as breathing treatments -Diuresis with 1 dose of Lasix today per cardiology -Chest x-ray demonstrating ongoing pulmonary vascular congestion and ABG stable  Small bowel obstruction -SBO has resolved and appreciate general surgery recommendations -Continue electrolyte supplementation as tolerated -Continue current tube feeds  Atrial fibrillation with RVR-controlled -Appreciate cardiology recommendations with initiation of oral metoprolol and weaning of amiodarone today -Continue heparin drip  Acute on chronic diastolic CHF -Continue to monitor I's and O's as well as daily weights - no further Lasix at this time -Chest x-ray continues to demonstrate pulmonary vascular congestion  AKI on CKD stage III (baseline creatinine 0.8-1.1) -Stable -Urine output of 1450 noted and no further Lasix  Pericardial effusion with MR -Repeat echocardiogram per cardiology today -No tamponade physiology noted  Mild hyperglycemia-likely steroid-induced -A1c 5.3% -Continue close monitoring  Mild diarrhea -Continue to monitor for frequency and consider C. difficile testing as needed -Likely related to recent tube feed change   DVT prophylaxis: IV heparin Code Status: Full Family Communication: Updated son, Nicole Kindred, at bedside on 6/17 Disposition  Plan:  Continue current management and wean ventilator as tolerated.  Cardiology to  obtain repeat 2D echocardiogram.   Consultants:   General surgery  Cardiology  Pulmonology  Procedures:   None  Antimicrobials:  Ceftriaxone/flagyl 6/7>>6/8 Merrem 6/8>>>6/17  Subjective: Patient seen and evaluated today with 3 episodes of loose stools noted in the last 24 hours.  Patient is without abdominal tenderness or pain and still has adequate rate control.  She did have some mild hypotension overnight as well.  Objective: Vitals:   04/05/19 0705 04/05/19 0800 04/05/19 0853 04/05/19 0900  BP:  (!) 98/50  (!) 116/55  Pulse:      Resp:  (!) 24  (!) 21  Temp:  99 F (37.2 C)  99 F (37.2 C)  TempSrc:      SpO2: 94% 93% 92% 96%  Weight:      Height:    '5\' 4"'$  (1.626 m)    Intake/Output Summary (Last 24 hours) at 04/05/2019 1000 Last data filed at 04/05/2019 0506 Gross per 24 hour  Intake 1857.01 ml  Output 1450 ml  Net 407.01 ml   Filed Weights   04/02/19 0500 04/03/19 0500 04/04/19 0500  Weight: 53.4 kg 52.5 kg 49.6 kg    Examination:  General exam: Appears calm and comfortable  Respiratory system: Clear to auscultation. Respiratory effort normal.  Currently intubated on FiO2 50%. Cardiovascular system: S1 & S2 heard, irregular and minimally tachycardic. No JVD, murmurs, rubs, gallops or clicks.  SCDs present. Gastrointestinal system: Abdomen is nondistended, soft and nontender. No organomegaly or masses felt. Normal bowel sounds heard. Central nervous system: Alert and awake. Extremities: Symmetric 5 x 5 power. Skin: No rashes, lesions or ulcers Psychiatry: Difficult to assess.    Data Reviewed: I have personally reviewed following labs and imaging studies  CBC: Recent Labs  Lab 03/31/19 0217 04/01/19 0450 04/03/19 0508 04/04/19 0408 04/05/19 0407  WBC 14.9* 18.0* 20.6* 27.6* 29.7*  HGB 11.0* 13.0 12.6 11.9* 11.7*  HCT 36.8 41.5 40.5 38.7 37.2  MCV  100.5* 98.1 95.5 95.8 96.1  PLT 168 220 228 211 466   Basic Metabolic Panel: Recent Labs  Lab 03/31/19 1245 03/31/19 1919 04/01/19 0450 04/01/19 1711 04/02/19 0414 04/03/19 0508 04/04/19 0408 04/05/19 0407  NA  --   --  143  --  148* 148* 149* 151*  K  --   --  4.4  --  3.7 3.1* 3.3* 3.8  CL  --   --  107  --  108 106 105 109  CO2  --   --  30  --  '31 30 28 31  '$ GLUCOSE  --   --  188*  --  164* 166* 196* 145*  BUN  --   --  47*  --  66* 75* 80* 93*  CREATININE  --   --  1.01*  --  1.06* 0.98 1.01* 1.17*  CALCIUM  --   --  9.9  --  10.1 9.5 9.1 9.1  MG 2.1 2.1 2.2 2.1  --   --  2.0 2.1  PHOS 2.7 3.0 2.9 2.9  --   --   --   --    GFR: Estimated Creatinine Clearance: 26 mL/min (A) (by C-G formula based on SCr of 1.17 mg/dL (H)). Liver Function Tests: Recent Labs  Lab 04/04/19 0408  AST 50*  ALT 93*  ALKPHOS 76  BILITOT 0.5  PROT 4.6*  ALBUMIN 1.9*   No results for input(s): LIPASE, AMYLASE in the last 168 hours. No results for input(s): AMMONIA in the last  168 hours. Coagulation Profile: No results for input(s): INR, PROTIME in the last 168 hours. Cardiac Enzymes: No results for input(s): CKTOTAL, CKMB, CKMBINDEX, TROPONINI in the last 168 hours. BNP (last 3 results) No results for input(s): PROBNP in the last 8760 hours. HbA1C: No results for input(s): HGBA1C in the last 72 hours. CBG: Recent Labs  Lab 04/04/19 1624 04/04/19 2002 04/04/19 2347 04/05/19 0434 04/05/19 0749  GLUCAP 124* 123* 129* 127* 136*   Lipid Profile: No results for input(s): CHOL, HDL, LDLCALC, TRIG, CHOLHDL, LDLDIRECT in the last 72 hours. Thyroid Function Tests: No results for input(s): TSH, T4TOTAL, FREET4, T3FREE, THYROIDAB in the last 72 hours. Anemia Panel: No results for input(s): VITAMINB12, FOLATE, FERRITIN, TIBC, IRON, RETICCTPCT in the last 72 hours. Sepsis Labs: Recent Labs  Lab 03/30/19 0444 04/05/19 0813  PROCALCITON  --  0.16  LATICACIDVEN 1.5  --     No results  found for this or any previous visit (from the past 240 hour(s)).       Radiology Studies: Dg Chest Port 1 View  Result Date: 04/05/2019 CLINICAL DATA:  Respiratory failure, hypertension, atrial fibrillation, former smoker EXAM: PORTABLE CHEST 1 VIEW COMPARISON:  Portable exam 0520 hours compared to 04/04/2019 FINDINGS: Tip of endotracheal tube projects 2.5 cm above carina. Nasogastric tube extends into stomach. LEFT arm PICC line with tip projecting over RIGHT atrium. Enlargement of cardiac silhouette with pulmonary vascular congestion. Persistent perihilar to basilar infiltrates bilaterally. Persistent LEFT pleural effusion. No pneumothorax. IMPRESSION: Persistent pulmonary infiltrates and LEFT pleural effusion. Enlargement of cardiac silhouette with pulmonary vascular congestion. Electronically Signed   By: Lavonia Dana M.D.   On: 04/05/2019 08:20   Dg Chest Port 1 View  Result Date: 04/04/2019 CLINICAL DATA:  Respiratory failure.  Atrial fibrillation EXAM: PORTABLE CHEST 1 VIEW COMPARISON:  04/03/2019 FINDINGS: Multiple tube is a overlies the midline chest making it difficult to be certain endotracheal placement. I cannot tell whether the endotracheal tube is above the carina or in the right mainstem bronchus. Persistent bilateral pulmonary infiltrates left worse than right. Possible mild improvement on the left and worsening on right. There may be an element associated edema as well. Small effusions left more than right. Left arm PICC tip unchanged in the SVC. IMPRESSION: Slight improvement of left lung infiltrates. Slight worsening of right lung infiltrates. Possible mild edema. Small effusions left right. Cannot be sure of endotracheal position due to multiple overlying artifacts. Electronically Signed   By: Nelson Chimes M.D.   On: 04/04/2019 09:11   Dg Chest Port 1 View  Result Date: 04/03/2019 CLINICAL DATA:  Respiratory failure, hypertension, atrial fibrillation EXAM: PORTABLE CHEST 1 VIEW  COMPARISON:  Portable exam 1033 hours compared to 04/02/2019 FINDINGS: LEFT arm PICC line tip projects over SVC. Tip of endotracheal tube projects 4.1 cm above carina. Enlargement of cardiac silhouette. Airspace infiltrates identified throughout mid to lower LEFT lung and at RIGHT base consistent with multifocal pneumonia. Less RIGHT perihilar infiltrate compared to previous exam. Persistent LEFT pleural effusion. No pneumothorax. Bones demineralized. IMPRESSION: Persistent BILATERAL pulmonary infiltrates and LEFT pleural effusion. Electronically Signed   By: Lavonia Dana M.D.   On: 04/03/2019 12:52        Scheduled Meds: . chlorhexidine gluconate (MEDLINE KIT)  15 mL Mouth Rinse BID  . Chlorhexidine Gluconate Cloth  6 each Topical Daily  . feeding supplement (OSMOLITE 1.5 CAL)  1,000 mL Per Tube Q24H  . feeding supplement (PRO-STAT SUGAR FREE 64)  30 mL Per  Tube TID  . free water  200 mL Per Tube Q8H  . insulin aspart  5 Units Subcutaneous Q4H  . levalbuterol  0.63 mg Nebulization TID  . mouth rinse  15 mL Mouth Rinse 10 times per day  . methylPREDNISolone (SOLU-MEDROL) injection  40 mg Intravenous Q12H  . metoprolol tartrate  25 mg Oral Q6H  . pantoprazole (PROTONIX) IV  40 mg Intravenous Q24H  . sodium chloride flush  10-40 mL Intracatheter Q12H  . sodium chloride flush  3 mL Intravenous Q12H   Continuous Infusions: . sodium chloride 250 mL (04/05/19 0455)  . fentaNYL infusion INTRAVENOUS 50 mcg/hr (04/05/19 8346)  . heparin 850 Units/hr (04/05/19 0450)     LOS: 11 days    Time spent: 30 minutes     Darleen Crocker, DO [facility] Pager [phone]  If 7PM-7AM, please contact night-coverage [email or web address] Password [email or web address] 04/05/2019, 10:00 AM

## 2019-04-05 NOTE — Progress Notes (Signed)
Dr. Maudie Mercury paged and made aware of pts BP- 83/45. Steady trending in 80s, fentanyl gtt continues. Pt arouses to voice when walking in room. Waiting for call back/orders. Will continue to monitor pt

## 2019-04-06 ENCOUNTER — Inpatient Hospital Stay (HOSPITAL_COMMUNITY): Payer: Medicare Other

## 2019-04-06 LAB — BLOOD GAS, ARTERIAL
Acid-Base Excess: 11.2 mmol/L — ABNORMAL HIGH (ref 0.0–2.0)
Bicarbonate: 34.2 mmol/L — ABNORMAL HIGH (ref 20.0–28.0)
FIO2: 50
O2 Saturation: 90.8 %
Patient temperature: 37.1
pCO2 arterial: 49.6 mmHg — ABNORMAL HIGH (ref 32.0–48.0)
pH, Arterial: 7.47 — ABNORMAL HIGH (ref 7.350–7.450)
pO2, Arterial: 61.5 mmHg — ABNORMAL LOW (ref 83.0–108.0)

## 2019-04-06 LAB — COMPREHENSIVE METABOLIC PANEL
ALT: 65 U/L — ABNORMAL HIGH (ref 0–44)
AST: 32 U/L (ref 15–41)
Albumin: 1.8 g/dL — ABNORMAL LOW (ref 3.5–5.0)
Alkaline Phosphatase: 69 U/L (ref 38–126)
Anion gap: 10 (ref 5–15)
BUN: 94 mg/dL — ABNORMAL HIGH (ref 8–23)
CO2: 31 mmol/L (ref 22–32)
Calcium: 8.9 mg/dL (ref 8.9–10.3)
Chloride: 110 mmol/L (ref 98–111)
Creatinine, Ser: 1.17 mg/dL — ABNORMAL HIGH (ref 0.44–1.00)
GFR calc Af Amer: 48 mL/min — ABNORMAL LOW (ref >60)
GFR calc non Af Amer: 42 mL/min — ABNORMAL LOW (ref >60)
Glucose, Bld: 171 mg/dL — ABNORMAL HIGH (ref 70–99)
Potassium: 4 mmol/L (ref 3.5–5.1)
Sodium: 151 mmol/L — ABNORMAL HIGH (ref 135–145)
Total Bilirubin: 0.8 mg/dL (ref 0.3–1.2)
Total Protein: 4.6 g/dL — ABNORMAL LOW (ref 6.5–8.1)

## 2019-04-06 LAB — GLUCOSE, CAPILLARY
Glucose-Capillary: 107 mg/dL — ABNORMAL HIGH (ref 70–99)
Glucose-Capillary: 109 mg/dL — ABNORMAL HIGH (ref 70–99)
Glucose-Capillary: 133 mg/dL — ABNORMAL HIGH (ref 70–99)
Glucose-Capillary: 147 mg/dL — ABNORMAL HIGH (ref 70–99)
Glucose-Capillary: 150 mg/dL — ABNORMAL HIGH (ref 70–99)
Glucose-Capillary: 160 mg/dL — ABNORMAL HIGH (ref 70–99)
Glucose-Capillary: 60 mg/dL — ABNORMAL LOW (ref 70–99)

## 2019-04-06 LAB — CBC
HCT: 35.4 % — ABNORMAL LOW (ref 36.0–46.0)
Hemoglobin: 10.8 g/dL — ABNORMAL LOW (ref 12.0–15.0)
MCH: 29.5 pg (ref 26.0–34.0)
MCHC: 30.5 g/dL (ref 30.0–36.0)
MCV: 96.7 fL (ref 80.0–100.0)
Platelets: 182 10*3/uL (ref 150–400)
RBC: 3.66 MIL/uL — ABNORMAL LOW (ref 3.87–5.11)
RDW: 14.9 % (ref 11.5–15.5)
WBC: 26.9 10*3/uL — ABNORMAL HIGH (ref 4.0–10.5)
nRBC: 0 % (ref 0.0–0.2)

## 2019-04-06 LAB — HEPARIN LEVEL (UNFRACTIONATED): Heparin Unfractionated: 0.5 IU/mL (ref 0.30–0.70)

## 2019-04-06 LAB — LACTIC ACID, PLASMA: Lactic Acid, Venous: 1.9 mmol/L (ref 0.5–1.9)

## 2019-04-06 LAB — PROCALCITONIN: Procalcitonin: 0.16 ng/mL

## 2019-04-06 NOTE — Progress Notes (Signed)
Progress Note  Patient Name: Carrie Morales Date of Encounter: 04/06/2019  Primary Cardiologist: Dr Darral Dash  Subjective   No events overnight  Inpatient Medications    Scheduled Meds: . chlorhexidine gluconate (MEDLINE KIT)  15 mL Mouth Rinse BID  . Chlorhexidine Gluconate Cloth  6 each Topical Daily  . feeding supplement (OSMOLITE 1.5 CAL)  1,000 mL Per Tube Q24H  . feeding supplement (PRO-STAT SUGAR FREE 64)  30 mL Per Tube TID  . free water  200 mL Per Tube Q8H  . insulin aspart  5 Units Subcutaneous Q4H  . levalbuterol  0.63 mg Nebulization TID  . mouth rinse  15 mL Mouth Rinse 10 times per day  . methylPREDNISolone (SOLU-MEDROL) injection  40 mg Intravenous Q12H  . metoprolol tartrate  25 mg Oral Q6H  . pantoprazole (PROTONIX) IV  40 mg Intravenous Q24H  . sodium chloride flush  10-40 mL Intracatheter Q12H  . sodium chloride flush  3 mL Intravenous Q12H   Continuous Infusions: . sodium chloride Stopped (04/05/19 0807)  . fentaNYL infusion INTRAVENOUS 50 mcg/hr (04/06/19 0729)  . heparin 850 Units/hr (04/06/19 0729)   PRN Meds: sodium chloride, acetaminophen **OR** acetaminophen, albuterol, bisacodyl, fentaNYL, metoprolol tartrate, midazolam, midazolam, ondansetron **OR** ondansetron (ZOFRAN) IV, phenol, polyethylene glycol, sodium chloride flush, sodium chloride flush, traZODone   Vital Signs    Vitals:   04/06/19 0600 04/06/19 0616 04/06/19 0632 04/06/19 0712  BP: (!) 90/58 108/70    Pulse:   (!) 111 94  Resp: (!) 24 (!) 24    Temp: 98.8 F (37.1 C) 98.6 F (37 C)    TempSrc:      SpO2: 91%     Weight:   50.3 kg   Height:        Intake/Output Summary (Last 24 hours) at 04/06/2019 0801 Last data filed at 04/06/2019 0729 Gross per 24 hour  Intake 470.23 ml  Output 400 ml  Net 70.23 ml   Last 3 Weights 04/06/2019 04/04/2019 04/03/2019  Weight (lbs) 110 lb 14.3 oz 109 lb 5.6 oz 115 lb 11.9 oz  Weight (kg) 50.3 kg 49.6 kg 52.5 kg      Telemetry   afib rates 80s-110s - Personally Reviewed  ECG    na - Personally Reviewed  Physical Exam   GEN: No acute distress.   Neck: No JVD Cardiac:irreg, 2/6 systolic murmur rusb, no jvd Respiratory: coarse bilaterally GI: Soft, nontender, non-distended  MS: No edema; No deformity. Neuro:  Nonfocal  Psych: Normal affect   Labs    Chemistry Recent Labs  Lab 04/04/19 0408 04/05/19 0407 04/06/19 0404  NA 149* 151* 151*  K 3.3* 3.8 4.0  CL 105 109 110  CO2 _0 GLUCOSE 196* 145* 171*  BUN 80* 93* 94*  CREATININE 1.01* 1.17* 1.17*  CALCIUM 9.1 9.1 8.9  PROT 4.6*  --  4.6*  ALBUMIN 1.9*  --  1.8*  AST 50*  --  32  ALT 93*  --  65*  ALKPHOS 76  --  69  BILITOT 0.5  --  0.8  GFRNONAA 50* 42* 42*  GFRAA 58* 48* 48*  ANIONGAP 16* 11 10     Hematology Recent Labs  Lab 04/04/19 0408 04/05/19 0407 04/06/19 0404  WBC 27.6* 29.7* 26.9*  RBC 4.04 3.87 3.66*  HGB 11.9* 11.7* 10.8*  HCT 38.7 37.2 35.4*  MCV 95.8 96.1 96.7  MCH 29.5 30.2 29.5  MCHC 30.7 31.5 30.5  RDW 14.5 15.0  14.9  PLT 211 185 182    Cardiac EnzymesNo results for input(s): TROPONINI in the last 168 hours. No results for input(s): TROPIPOC in the last 168 hours.   BNPNo results for input(s): BNP, PROBNP in the last 168 hours.   DDimer No results for input(s): DDIMER in the last 168 hours.   Radiology    Dg Chest Port 1 View  Result Date: 04/06/2019 CLINICAL DATA:  Respiratory failure. EXAM: PORTABLE CHEST 1 VIEW COMPARISON:  04/05/2019. FINDINGS: Endotracheal tube and left PICC line stable position. Cardiomegaly again noted. Persistent bilateral pulmonary infiltrates/edema and bilateral pleural effusions. Similar findings on prior exam. No pneumothorax. Thoracic spine scoliosis. IMPRESSION: 1.  Lines and tubes and stable position. 2. Cardiomegaly again noted. Persistent bilateral prominent pulmonary infiltrates and/or edema again noted without significant change. Bilateral pleural effusions, left  side greater than right again noted. Electronically Signed   By: Marcello Moores  Register   On: 04/06/2019 06:39   Dg Chest Port 1 View  Result Date: 04/05/2019 CLINICAL DATA:  Respiratory failure, hypertension, atrial fibrillation, former smoker EXAM: PORTABLE CHEST 1 VIEW COMPARISON:  Portable exam 0520 hours compared to 04/04/2019 FINDINGS: Tip of endotracheal tube projects 2.5 cm above carina. Nasogastric tube extends into stomach. LEFT arm PICC line with tip projecting over RIGHT atrium. Enlargement of cardiac silhouette with pulmonary vascular congestion. Persistent perihilar to basilar infiltrates bilaterally. Persistent LEFT pleural effusion. No pneumothorax. IMPRESSION: Persistent pulmonary infiltrates and LEFT pleural effusion. Enlargement of cardiac silhouette with pulmonary vascular congestion. Electronically Signed   By: Lavonia Dana M.D.   On: 04/05/2019 08:20    Cardiac Studies     Patient Profile     85 y.o.femalewith a history of permanent atrial fibrillation diagnosed in December 2019 (also intermittent atrial flutter based on records) andhypertension admitted06/07with small bowel obstruction and sepsis secondary to pneumonia.  Assessment & Plan   1. Permanent afib/intermittent aflutter - issues with tachycardia in setting of severe systemic illness - low bp's on dilt gtt, changed to amio for rate control - she is on hep gtt  - we tried changing amio IV to oral lopressor. Still issues with soft bp's, her AM dose yesterday was held though looks like she was also receiving some IV lopressor at the time. - she is on hep gtt, can convert to oral anticoag once extubated  -if bp's worsen over weekend could stop lopressor and restart amio gtt.   2. Acute diastolic HF - 12/8180 echo: LVEF >65%, indeterminate diastolic function  CVP of 10 in intubated would be considered normal, particularly with soft bp's and AKI and hypernatremia would not further diurese. No significant  volume overload by examI think her CXR is more ARDS as opposed to pulmonary edema.  - avoid hypovolemia in setting of dynamic LVOT gradient   3. Heart murmur secondary to hyperdynamic LVEF with small LVOT and turbulent flow associated with anterior systolic motion of the mitral valve. - see echo report above - no additional eval needed at this time - need to watch volume  4. SBO - per primary team  5. Sepsis with pneumonia and respiratory failure with ARDS - per primary team and pulmonary - remains intubated, on ARDS protocol  - from pulm notes doing well with vent weaning, down to 40% FiO2.  6. Incidentally noted coronary and aortic calcifications by CT imaging. - no further eval at this time, f/u as outpt  7. Pericardial effusion - noted on echo 03/2019, no evidence of tamponade - repeat  echo this admit just trivial effusion  8. Mitral regurgitation - moderate by echo  For questions or updates, please contact [place] Please consult [email or web address] for contact info under        Signed, Carlyle Dolly, MD  04/06/2019, 8:01 AM

## 2019-04-06 NOTE — Progress Notes (Signed)
ANTICOAGULATION CONSULT NOTE - Follow Up Consult  Pharmacy Consult for heparin Indication: atrial fibrillation  HEPARIN DW (KG): 47.6   Labs: Recent Labs    04/04/19 0408  04/05/19 0407 04/05/19 1018 04/05/19 2006 04/06/19 0404  HGB 11.9*  --  11.7*  --   --  10.8*  HCT 38.7  --  37.2  --   --  35.4*  PLT 211  --  185  --   --  182  HEPARINUNFRC 0.47   < >  --  0.41 0.38 0.50  CREATININE 1.01*  --  1.17*  --   --  1.17*   < > = values in this interval not displayed.    Assessment: 85yo female with history of atrial fibrillation. Heparin is therapeutic now at 0.50 IU/mL.   Goal of Therapy:  Heparin level 0.3-0.7 units/ml  Monitor platelets per protocol   Plan:  Continue heparin infusion at 850 units/hr. Heparin level daily while on heparin. Continue to monitor H&H and s/s of bleeding.  Despina Pole, Pharm. D. Clinical Pharmacist 04/06/2019 8:02 AM

## 2019-04-06 NOTE — Progress Notes (Signed)
Subjective: She motions that she feels okay.  No new complaints.  She remains intubated and on the ventilator but is awake and alert.  She was able to breathe on her own through the endotracheal tube for about 6 hours yesterday.  She is still on 50% oxygen so she is not a candidate for extubation yet.  Chest x-ray yesterday basically unchanged.  Her albumin this morning is 1.8.  Objective: Vital signs in last 24 hours: Temp:  [98.6 F (37 C)-99.9 F (37.7 C)] 98.8 F (37.1 C) (06/19 0800) Pulse Rate:  [93-128] 93 (06/19 0800) Resp:  [11-30] 24 (06/19 0800) BP: (85-137)/(39-92) 92/43 (06/19 0800) SpO2:  [87 %-100 %] 95 % (06/19 0800) FiO2 (%):  [40 %-50 %] 50 % (06/19 0433) Weight:  [50.3 kg] 50.3 kg (06/19 9604) Weight change:  Last BM Date: 04/05/19  Intake/Output from previous day: 06/18 0701 - 06/19 0700 In: 23 [I.V.:23] Out: 400 [Urine:400]  PHYSICAL EXAM General appearance: alert, cooperative, no distress and Intubated and on mechanical ventilation Resp: rales bilaterally and rhonchi bilaterally Cardio: She remains in atrial fib and has systolic heart murmur.  No gallop GI: soft, non-tender; bowel sounds normal; no masses,  no organomegaly Extremities: She has some third spacing of fluid  Lab Results:  Results for orders placed or performed during the hospital encounter of 03/24/2019 (from the past 48 hour(s))  Glucose, capillary     Status: Abnormal   Collection Time: 04/04/19 11:08 AM  Result Value Ref Range   Glucose-Capillary 121 (H) 70 - 99 mg/dL  Heparin level (unfractionated)     Status: Abnormal   Collection Time: 04/04/19  2:00 PM  Result Value Ref Range   Heparin Unfractionated 2.20 (H) 0.30 - 0.70 IU/mL    Comment: RESULTS CONFIRMED BY MANUAL DILUTION (NOTE) If heparin results are below expected values, and patient dosage has  been confirmed, suggest follow up testing of antithrombin III levels. Performed at [facility], [address] [place], [place]  [address]   Glucose, capillary     Status: Abnormal   Collection Time: 04/04/19  4:24 PM  Result Value Ref Range   Glucose-Capillary 124 (H) 70 - 99 mg/dL  Glucose, capillary     Status: Abnormal   Collection Time: 04/04/19  8:02 PM  Result Value Ref Range   Glucose-Capillary 123 (H) 70 - 99 mg/dL  Heparin level (unfractionated)     Status: Abnormal   Collection Time: 04/04/19 11:22 PM  Result Value Ref Range   Heparin Unfractionated 0.11 (L) 0.30 - 0.70 IU/mL    Comment: (NOTE) If heparin results are below expected values, and patient dosage has  been confirmed, suggest follow up testing of antithrombin III levels. Performed at [facility], [address] [place], [place] [address]   Glucose, capillary     Status: Abnormal   Collection Time: 04/04/19 11:47 PM  Result Value Ref Range   Glucose-Capillary 129 (H) 70 - 99 mg/dL  CBC     Status: Abnormal   Collection Time: 04/05/19  4:07 AM  Result Value Ref Range   WBC 29.7 (H) 4.0 - 10.5 K/uL   RBC 3.87 3.87 - 5.11 MIL/uL   Hemoglobin 11.7 (L) 12.0 - 15.0 g/dL   HCT 37.2 36.0 - 46.0 %   MCV 96.1 80.0 - 100.0 fL   MCH 30.2 26.0 - 34.0 pg   MCHC 31.5 30.0 - 36.0 g/dL   RDW 15.0 11.5 - 15.5 %   Platelets 185 150 -  400 K/uL   nRBC 0.0 0.0 - 0.2 %    Comment: Performed at [facility], [address], [place], [place] [address]  Basic metabolic panel     Status: Abnormal   Collection Time: 04/05/19  4:07 AM  Result Value Ref Range   Sodium 151 (H) 135 - 145 mmol/L   Potassium 3.8 3.5 - 5.1 mmol/L   Chloride 109 98 - 111 mmol/L   CO2 31 22 - 32 mmol/L   Glucose, Bld 145 (H) 70 - 99 mg/dL   BUN 93 (H) 8 - 23 mg/dL   Creatinine, Ser 1.17 (H) 0.44 - 1.00 mg/dL   Calcium 9.1 8.9 - 10.3 mg/dL   GFR calc non Af Amer 42 (L) >60 mL/min   GFR calc Af Amer 48 (L) >60 mL/min   Anion gap 11 5 - 15    Comment: Performed at [facility], [address] [place], [place] [address]  Magnesium     Status: None   Collection Time: 04/05/19  4:07  AM  Result Value Ref Range   Magnesium 2.1 1.7 - 2.4 mg/dL    Comment: Performed at [facility], [address] [place], [place] [address]  Glucose, capillary     Status: Abnormal   Collection Time: 04/05/19  4:34 AM  Result Value Ref Range   Glucose-Capillary 127 (H) 70 - 99 mg/dL  Blood gas, arterial     Status: Abnormal   Collection Time: 04/05/19  5:00 AM  Result Value Ref Range   FIO2 50.00    pH, Arterial 7.446 7.350 - 7.450   pCO2 arterial 52.9 (H) 32.0 - 48.0 mmHg   pO2, Arterial 62.6 (L) 83.0 - 108.0 mmHg   Bicarbonate 34.0 (H) 20.0 - 28.0 mmol/L   Acid-Base Excess 11.3 (H) 0.0 - 2.0 mmol/L   O2 Saturation 90.8 %   Patient temperature 37.4    Allens test (pass/fail) PASS PASS    Comment: Performed at [facility], [address], [place], [place] [address]  Glucose, capillary     Status: Abnormal   Collection Time: 04/05/19  7:49 AM  Result Value Ref Range   Glucose-Capillary 136 (H) 70 - 99 mg/dL  Procalcitonin - Baseline     Status: None   Collection Time: 04/05/19  8:13 AM  Result Value Ref Range   Procalcitonin 0.16 ng/mL    Comment:        Interpretation: PCT (Procalcitonin) <= 0.5 ng/mL: Systemic infection (sepsis) is not likely. Local bacterial infection is possible. (NOTE)       Sepsis PCT Algorithm           Lower Respiratory Tract                                      Infection PCT Algorithm    ----------------------------     ----------------------------         PCT < 0.25 ng/mL                PCT < 0.10 ng/mL         Strongly encourage             Strongly discourage   discontinuation of antibiotics    initiation of antibiotics    ----------------------------     -----------------------------       PCT 0.25 - 0.50 ng/mL  PCT 0.10 - 0.25 ng/mL               OR       >80% decrease in PCT            Discourage initiation of                                            antibiotics      Encourage discontinuation           of antibiotics     ----------------------------     -----------------------------         PCT >= 0.50 ng/mL              PCT 0.26 - 0.50 ng/mL               AND        <80% decrease in PCT             Encourage initiation of                                             antibiotics       Encourage continuation           of antibiotics    ----------------------------     -----------------------------        PCT >= 0.50 ng/mL                  PCT > 0.50 ng/mL               AND         increase in PCT                  Strongly encourage                                      initiation of antibiotics    Strongly encourage escalation           of antibiotics                                     -----------------------------                                           PCT <= 0.25 ng/mL                                                 OR                                        > 80% decrease in PCT  Discontinue / Do not initiate                                             antibiotics Performed at [facility], [address] [place], [place] [address]   Cortisol     Status: None   Collection Time: 04/05/19  8:13 AM  Result Value Ref Range   Cortisol, Plasma 12.7 ug/dL    Comment: (NOTE) AM    6.7 - 22.6 ug/dL PM   <10.0       ug/dL Performed at [facility] [address], [place], [place] [address]   Heparin level (unfractionated)     Status: None   Collection Time: 04/05/19 10:18 AM  Result Value Ref Range   Heparin Unfractionated 0.41 0.30 - 0.70 IU/mL    Comment: (NOTE) If heparin results are below expected values, and patient dosage has  been confirmed, suggest follow up testing of antithrombin III levels. Performed at [facility], [address], [place], [place] [address]   Glucose, capillary     Status: Abnormal   Collection Time: 04/05/19 11:43 AM  Result Value Ref Range   Glucose-Capillary 133 (H) 70 - 99 mg/dL  Glucose, capillary     Status: Abnormal    Collection Time: 04/05/19  4:34 PM  Result Value Ref Range   Glucose-Capillary 159 (H) 70 - 99 mg/dL  Heparin level (unfractionated)     Status: None   Collection Time: 04/05/19  8:06 PM  Result Value Ref Range   Heparin Unfractionated 0.38 0.30 - 0.70 IU/mL    Comment: (NOTE) If heparin results are below expected values, and patient dosage has  been confirmed, suggest follow up testing of antithrombin III levels. Performed at [facility], [address] [place], [place] [address]   Glucose, capillary     Status: Abnormal   Collection Time: 04/05/19  8:17 PM  Result Value Ref Range   Glucose-Capillary 157 (H) 70 - 99 mg/dL  Glucose, capillary     Status: Abnormal   Collection Time: 04/06/19 12:17 AM  Result Value Ref Range   Glucose-Capillary 160 (H) 70 - 99 mg/dL  CBC     Status: Abnormal   Collection Time: 04/06/19  4:04 AM  Result Value Ref Range   WBC 26.9 (H) 4.0 - 10.5 K/uL   RBC 3.66 (L) 3.87 - 5.11 MIL/uL   Hemoglobin 10.8 (L) 12.0 - 15.0 g/dL   HCT 35.4 (L) 36.0 - 46.0 %   MCV 96.7 80.0 - 100.0 fL   MCH 29.5 26.0 - 34.0 pg   MCHC 30.5 30.0 - 36.0 g/dL   RDW 14.9 11.5 - 15.5 %   Platelets 182 150 - 400 K/uL   nRBC 0.0 0.0 - 0.2 %    Comment: Performed at [facility], [address] [place], [place] [address]  Procalcitonin     Status: None   Collection Time: 04/06/19  4:04 AM  Result Value Ref Range   Procalcitonin 0.16 ng/mL    Comment:        Interpretation: PCT (Procalcitonin) <= 0.5 ng/mL: Systemic infection (sepsis) is not likely. Local bacterial infection is possible. (NOTE)       Sepsis PCT Algorithm           Lower Respiratory Tract  Infection PCT Algorithm    ----------------------------     ----------------------------         PCT < 0.25 ng/mL                PCT < 0.10 ng/mL         Strongly encourage             Strongly discourage   discontinuation of antibiotics    initiation of antibiotics     ----------------------------     -----------------------------       PCT 0.25 - 0.50 ng/mL            PCT 0.10 - 0.25 ng/mL               OR       >80% decrease in PCT            Discourage initiation of                                            antibiotics      Encourage discontinuation           of antibiotics    ----------------------------     -----------------------------         PCT >= 0.50 ng/mL              PCT 0.26 - 0.50 ng/mL               AND        <80% decrease in PCT             Encourage initiation of                                             antibiotics       Encourage continuation           of antibiotics    ----------------------------     -----------------------------        PCT >= 0.50 ng/mL                  PCT > 0.50 ng/mL               AND         increase in PCT                  Strongly encourage                                      initiation of antibiotics    Strongly encourage escalation           of antibiotics                                     -----------------------------                                           PCT <= 0.25 ng/mL  OR                                        > 80% decrease in PCT                                     Discontinue / Do not initiate                                             antibiotics Performed at [facility], [address] [place], [place] [address]   Lactic acid, plasma     Status: None   Collection Time: 04/06/19  4:04 AM  Result Value Ref Range   Lactic Acid, Venous 1.9 0.5 - 1.9 mmol/L    Comment: Performed at [facility], [address], [place], [place] [address]  Comprehensive metabolic panel     Status: Abnormal   Collection Time: 04/06/19  4:04 AM  Result Value Ref Range   Sodium 151 (H) 135 - 145 mmol/L   Potassium 4.0 3.5 - 5.1 mmol/L   Chloride 110 98 - 111 mmol/L   CO2 31 22 - 32 mmol/L   Glucose, Bld 171 (H) 70 - 99 mg/dL   BUN 94 (H) 8 - 23  mg/dL   Creatinine, Ser 1.17 (H) 0.44 - 1.00 mg/dL   Calcium 8.9 8.9 - 10.3 mg/dL   Total Protein 4.6 (L) 6.5 - 8.1 g/dL   Albumin 1.8 (L) 3.5 - 5.0 g/dL   AST 32 15 - 41 U/L   ALT 65 (H) 0 - 44 U/L   Alkaline Phosphatase 69 38 - 126 U/L   Total Bilirubin 0.8 0.3 - 1.2 mg/dL   GFR calc non Af Amer 42 (L) >60 mL/min   GFR calc Af Amer 48 (L) >60 mL/min   Anion gap 10 5 - 15    Comment: Performed at [facility], [address] [place], [place] [address]  Heparin level (unfractionated)     Status: None   Collection Time: 04/06/19  4:04 AM  Result Value Ref Range   Heparin Unfractionated 0.50 0.30 - 0.70 IU/mL    Comment: (NOTE) If heparin results are below expected values, and patient dosage has  been confirmed, suggest follow up testing of antithrombin III levels. Performed at [facility], [address], [place], [place] [address]   Glucose, capillary     Status: Abnormal   Collection Time: 04/06/19  4:36 AM  Result Value Ref Range   Glucose-Capillary 150 (H) 70 - 99 mg/dL  Blood gas, arterial     Status: Abnormal   Collection Time: 04/06/19  5:00 AM  Result Value Ref Range   FIO2 50.00    pH, Arterial 7.470 (H) 7.350 - 7.450   pCO2 arterial 49.6 (H) 32.0 - 48.0 mmHg   pO2, Arterial 61.5 (L) 83.0 - 108.0 mmHg   Bicarbonate 34.2 (H) 20.0 - 28.0 mmol/L   Acid-Base Excess 11.2 (H) 0.0 - 2.0 mmol/L   O2 Saturation 90.8 %   Patient temperature 37.1    Allens test (pass/fail) PASS PASS    Comment: Performed at [facility], [address], [place],  [address]  Glucose, capillary  Status: Abnormal   Collection Time: 04/06/19  7:40 AM  Result Value Ref Range   Glucose-Capillary 147 (H) 70 - 99 mg/dL    ABGS Recent Labs    04/06/19 0500  PHART 7.470*  PO2ART 61.5*  HCO3 34.2*   CULTURES No results found for this or any previous visit (from the past 240 hour(s)). Studies/Results: Dg Chest Port 1 View  Result Date: 04/06/2019 CLINICAL DATA:  Respiratory  failure. EXAM: PORTABLE CHEST 1 VIEW COMPARISON:  04/05/2019. FINDINGS: Endotracheal tube and left PICC line stable position. Cardiomegaly again noted. Persistent bilateral pulmonary infiltrates/edema and bilateral pleural effusions. Similar findings on prior exam. No pneumothorax. Thoracic spine scoliosis. IMPRESSION: 1.  Lines and tubes and stable position. 2. Cardiomegaly again noted. Persistent bilateral prominent pulmonary infiltrates and/or edema again noted without significant change. Bilateral pleural effusions, left side greater than right again noted. Electronically Signed   By: Marcello Moores  Register   On: 04/06/2019 06:39   Dg Chest Port 1 View  Result Date: 04/05/2019 CLINICAL DATA:  Respiratory failure, hypertension, atrial fibrillation, former smoker EXAM: PORTABLE CHEST 1 VIEW COMPARISON:  Portable exam 0520 hours compared to 04/04/2019 FINDINGS: Tip of endotracheal tube projects 2.5 cm above carina. Nasogastric tube extends into stomach. LEFT arm PICC line with tip projecting over RIGHT atrium. Enlargement of cardiac silhouette with pulmonary vascular congestion. Persistent perihilar to basilar infiltrates bilaterally. Persistent LEFT pleural effusion. No pneumothorax. IMPRESSION: Persistent pulmonary infiltrates and LEFT pleural effusion. Enlargement of cardiac silhouette with pulmonary vascular congestion. Electronically Signed   By: Lavonia Dana M.D.   On: 04/05/2019 08:20    Medications:  Prior to Admission:  Medications Prior to Admission  Medication Sig Dispense Refill Last Dose  . apixaban (ELIQUIS) 2.5 MG TABS tablet Take 2.5 mg by mouth 2 (two) times daily.    03/24/2019 at Unknown time  . aspirin 81 MG tablet Take 81 mg by mouth daily.   unknown  . Calcium Carbonate-Vitamin D (CALCIUM + D PO) Take 1 tablet by mouth daily.    unknown  . digoxin (LANOXIN) 0.125 MG tablet Take 0.125 mg by mouth daily.   03/24/2019 at Unknown time  . furosemide (LASIX) 20 MG tablet Take 20 mg by mouth See  admin instructions. Take one tablet by mouth daily 5 days per week   03/23/2019 at Unknown time  . losartan (COZAAR) 50 MG tablet Take 50 mg by mouth daily.    03/24/2019 at Unknown time  . metoprolol tartrate (LOPRESSOR) 100 MG tablet Take 100 mg by mouth 2 (two) times daily.    03/24/2019 at Unknown time  . Multiple Vitamin (MULTIVITAMIN) tablet Take 1 tablet by mouth daily.   03/24/2019  . ondansetron (ZOFRAN) 4 MG tablet Take 4 mg by mouth daily as needed for nausea or vomiting.   03/24/2019 at Unknown time  . potassium chloride (K-DUR) 10 MEQ tablet Take 10 mEq by mouth See admin instructions. Take one tablet daily by mouth 5 days per week   03/23/2019 at Unknown time  . Cholecalciferol (VITAMIN D PO) Take 1 capsule by mouth daily.    03/24/2019   Scheduled: . chlorhexidine gluconate (MEDLINE KIT)  15 mL Mouth Rinse BID  . Chlorhexidine Gluconate Cloth  6 each Topical Daily  . feeding supplement (OSMOLITE 1.5 CAL)  1,000 mL Per Tube Q24H  . feeding supplement (PRO-STAT SUGAR FREE 64)  30 mL Per Tube TID  . free water  200 mL Per Tube Q8H  . insulin aspart  5 Units Subcutaneous Q4H  . levalbuterol  0.63 mg Nebulization TID  . mouth rinse  15 mL Mouth Rinse 10 times per day  . methylPREDNISolone (SOLU-MEDROL) injection  40 mg Intravenous Q12H  . metoprolol tartrate  25 mg Oral Q6H  . pantoprazole (PROTONIX) IV  40 mg Intravenous Q24H  . sodium chloride flush  10-40 mL Intracatheter Q12H  . sodium chloride flush  3 mL Intravenous Q12H   Continuous: . sodium chloride Stopped (04/05/19 0807)  . fentaNYL infusion INTRAVENOUS 50 mcg/hr (04/06/19 0729)  . heparin 850 Units/hr (04/06/19 0729)   TEL:MRAJHH chloride, acetaminophen **OR** acetaminophen, albuterol, bisacodyl, fentaNYL, metoprolol tartrate, midazolam, midazolam, ondansetron **OR** ondansetron (ZOFRAN) IV, phenol, polyethylene glycol, sodium chloride flush, sodium chloride flush, traZODone  Assesment: She was admitted with small bowel  obstruction.  She was improving when she developed acute respiratory distress.  She was found to have marked bilateral infiltrates with essentially complete white out of her left lung.  This resulted in her being intubated and starting on mechanical ventilation with ARDS protocol.  She was switched to standard mechanical ventilation about 72 hours ago and has done pretty well.  She is come down from 100% oxygen with 10 of PEEP to 50% with 5 of PEEP.  Oxygenation is somewhat marginal on that.  Chest x-ray has not changed the last several days.  She had aspiration pneumonia and may have aspirated again when she had the acute deterioration  She had atrial fib with RVR.  She had soft blood pressures when she was put on the ventilator so she was switched to amiodarone for rate control.  Her small bowel obstruction has resolved and she is taking tube feedings and tolerating well.  However this morning her albumin is only 1.8.  If this does not start climbing we may need to give her some IV albumin   Principal Problem:   SBO (small bowel obstruction) (HCC) Active Problems:   Essential hypertension   Chronic a-fib   PNA (pneumonia)--Aspiration   Respiratory failure (HCC)   Acute diastolic CHF (congestive heart failure) (HCC)   Atrial fibrillation with RVR (HCC)   Elevated troponin   Lobar pneumonia (HCC)   Coronary artery disease due to lipid rich plaque   Hypotension   Ventilator dependence (HCC)   Goals of care, counseling/discussion   Palliative care encounter    Plan: Repeat albumin level tomorrow..  Consider IV albumin depending on results.  Continue other treatments.  Okay to be off the ventilator again today but she is not ready for extubation    LOS: 12 days   Alonza Bogus 04/06/2019, 8:25 AM

## 2019-04-06 NOTE — Progress Notes (Signed)
PROGRESS NOTE    Carrie Morales  ZOX:[id number] DOB: May 06, 1928 DOA: 04/12/2019 PCP: Earney Mallet, MD   Brief Narrative:  Per HPI: 85 year old female with a history of chronic atrial fibrillation on apixaban, diastolic CHF, hypertension, bowel obstruction status post bowel resection 30 years ago presenting with 1 week history of abdominal pain that worsened over the past 2 to 3 days to the point of resulting in nausea and vomiting. The patient was also having constipation-like symptoms and tried some over-the-counter laxatives without relief. Because of her progressive symptoms she presented for further evaluation. CT of the abdomen and pelvis in the ED showed left basilar collapse less consolidative changes, small bilateral pleural effusions, left greater than right with a large hiatus hernia. There was also dilatation of the proximal and mid small bowel loops up to 2.9 cm consistent with a high-grade SBO. The patient was treated with bowel rest and IV fluids. When she arrived to the medical floor, the patient developed atrial fibrillation with RVR and respiratory distress. She was transferred to the stepdown unit and started on diltiazem drip and placed on BiPAP. Subsequently, the patient became hypotensive requiring fluid resuscitation. Repeat chest x-ray show worsening bilateral perihilar densities with stable bibasilar opacifications. Lactic acid peaked at 4.4. The patient was started on ceftriaxone and metronidazole.  She has now completed course of treatment with Merrem and continues to remain on the ventilator due to hypoxemic and hypercapnic respiratory failure.  She has been weaned off amiodarone on 6/17 and remains in atrial fibrillation, but is rate controlled and on heparin drip.   Assessment & Plan:   Principal Problem:   SBO (small bowel obstruction) (HCC) Active Problems:   Essential hypertension   Chronic a-fib   PNA (pneumonia)--Aspiration   Respiratory  failure (HCC)   Acute diastolic CHF (congestive heart failure) (HCC)   Atrial fibrillation with RVR (HCC)   Elevated troponin   Lobar pneumonia (HCC)   Coronary artery disease due to lipid rich plaque   Hypotension   Ventilator dependence (HCC)   Goals of care, counseling/discussion   Palliative care encounter   Sepsis secondary to pneumonia -Merrem has completed 6/17, leukocytosis now downtrending -Procalcitonin low -Blood cultures with no growth and urine analysis negative  Acute hypoxemic and hypercapnic respiratory failure secondarytoabove -Associated ARDS as well as acute on chronic heart failure noted -Appreciate pulmonology assistance and recommendations with FiO2 50% % today. -Continue Solu-Medrol 40 mg IV twice daily as well as breathing treatments -Chest x-ray demonstrating ongoing pulmonary vascular congestion and ABG stable -Attempting ventilator weaning.  Small bowel obstruction -SBO has resolved and appreciate general surgery recommendations -Continue electrolyte supplementation as tolerated -Continue current tube feeds  Atrial fibrillation with RVR-controlled -Appreciate cardiology recommendations with initiation of oral metoprolol and weaning of amiodarone today -Continue heparin drip  Acute on chronic diastolic CHF -Continue to monitor I's and O's as well as daily weights - no further Lasix at this time -Chest x-ray continues to demonstrate pulmonary vascular congestion with repeat performed today 6/19  AKI on CKD stage III (baseline creatinine 0.8-1.1) -Stable -Urine output of 400 noted and no further Lasix  Pericardial effusion with MR -Repeat echocardiogram per cardiology performed 6/18 with no significant worsening effusion -No tamponade physiology noted  Mild hyperglycemia-likely steroid-induced -A1c 5.3% -Continue close monitoring  Mild diarrhea-improved -Continue to monitor, but likely was due to tube feeding change   Hypoalbuminemia -Repeat albumin in a.m. and consider infusion as needed   DVT prophylaxis:IV heparin Code Status:Full Family Communication:Updated son,  Nicole Kindred, at bedside on 6/17 Disposition Plan: Continue current management and wean ventilator as tolerated.  Cardiology to obtain repeat 2D echocardiogram.   Consultants:  General surgery  Cardiology  Pulmonology  Procedures:  None  Antimicrobials: Ceftriaxone/flagyl 6/7>>6/8 Merrem 6/8>>>6/17   Subjective: Patient seen and evaluated today with no new acute complaints or concerns. No acute concerns or events noted overnight.  She has had less bowel movements and appears to be in good spirits this morning.  Her blood pressures continue to remain soft and she is mildly tachycardic.  Objective: Vitals:   04/06/19 0712 04/06/19 0800 04/06/19 0930 04/06/19 1030  BP:  (!) 92/43 (!) 110/51 114/67  Pulse: 94 93    Resp:  (!) 24 (!) 24 (!) 24  Temp:  98.8 F (37.1 C) 98.8 F (37.1 C) 99 F (37.2 C)  TempSrc:      SpO2:  95% 97% 94%  Weight:      Height:        Intake/Output Summary (Last 24 hours) at 04/06/2019 1100 Last data filed at 04/06/2019 0729 Gross per 24 hour  Intake 447.23 ml  Output 400 ml  Net 47.23 ml   Filed Weights   04/03/19 0500 04/04/19 0500 04/06/19 1884  Weight: 52.5 kg 49.6 kg 50.3 kg    Examination:  General exam: Appears calm and comfortable  Respiratory system: Clear to auscultation. Respiratory effort normal.  Intubated on FiO2 50%. Cardiovascular system: S1 & S2 heard, irregular and mildly tachycardic. No JVD, murmurs, rubs, gallops or clicks. No pedal edema. Gastrointestinal system: Abdomen is nondistended, soft and nontender. No organomegaly or masses felt. Normal bowel sounds heard. Central nervous system: Alert and awake. Extremities: Symmetric 5 x 5 power. Skin: No rashes, lesions or ulcers Psychiatry: Difficult to determine.    Data Reviewed: I have personally  reviewed following labs and imaging studies  CBC: Recent Labs  Lab 04/01/19 0450 04/03/19 0508 04/04/19 0408 04/05/19 0407 04/06/19 0404  WBC 18.0* 20.6* 27.6* 29.7* 26.9*  HGB 13.0 12.6 11.9* 11.7* 10.8*  HCT 41.5 40.5 38.7 37.2 35.4*  MCV 98.1 95.5 95.8 96.1 96.7  PLT 220 228 211 185 166   Basic Metabolic Panel: Recent Labs  Lab 03/31/19 1245 03/31/19 1919 04/01/19 0450 04/01/19 1711 04/02/19 0414 04/03/19 0508 04/04/19 0408 04/05/19 0407 04/06/19 0404  NA  --   --  143  --  148* 148* 149* 151* 151*  K  --   --  4.4  --  3.7 3.1* 3.3* 3.8 4.0  CL  --   --  107  --  108 106 105 109 110  CO2  --   --  30  --  _0 GLUCOSE  --   --  188*  --  164* 166* 196* 145* 171*  BUN  --   --  47*  --  66* 75* 80* 93* 94*  CREATININE  --   --  1.01*  --  1.06* 0.98 1.01* 1.17* 1.17*  CALCIUM  --   --  9.9  --  10.1 9.5 9.1 9.1 8.9  MG 2.1 2.1 2.2 2.1  --   --  2.0 2.1  --   PHOS 2.7 3.0 2.9 2.9  --   --   --   --   --    GFR: Estimated Creatinine Clearance: 26.4 mL/min (A) (by C-G formula based on SCr of 1.17 mg/dL (H)). Liver Function Tests: Recent Labs  Lab 04/04/19 0408  04/06/19 0404  AST 50* 32  ALT 93* 65*  ALKPHOS 76 69  BILITOT 0.5 0.8  PROT 4.6* 4.6*  ALBUMIN 1.9* 1.8*   No results for input(s): LIPASE, AMYLASE in the last 168 hours. No results for input(s): AMMONIA in the last 168 hours. Coagulation Profile: No results for input(s): INR, PROTIME in the last 168 hours. Cardiac Enzymes: No results for input(s): CKTOTAL, CKMB, CKMBINDEX, TROPONINI in the last 168 hours. BNP (last 3 results) No results for input(s): PROBNP in the last 8760 hours. HbA1C: No results for input(s): HGBA1C in the last 72 hours. CBG: Recent Labs  Lab 04/05/19 1634 04/05/19 2017 04/06/19 0017 04/06/19 0436 04/06/19 0740  GLUCAP 159* 157* 160* 150* 147*   Lipid Profile: No results for input(s): CHOL, HDL, LDLCALC, TRIG, CHOLHDL, LDLDIRECT in the last 72 hours.  Thyroid Function Tests: No results for input(s): TSH, T4TOTAL, FREET4, T3FREE, THYROIDAB in the last 72 hours. Anemia Panel: No results for input(s): VITAMINB12, FOLATE, FERRITIN, TIBC, IRON, RETICCTPCT in the last 72 hours. Sepsis Labs: Recent Labs  Lab 04/05/19 0813 04/06/19 0404  PROCALCITON 0.16 0.16  LATICACIDVEN  --  1.9    No results found for this or any previous visit (from the past 240 hour(s)).       Radiology Studies: Dg Chest Port 1 View  Result Date: 04/06/2019 CLINICAL DATA:  Respiratory failure. EXAM: PORTABLE CHEST 1 VIEW COMPARISON:  04/05/2019. FINDINGS: Endotracheal tube and left PICC line stable position. Cardiomegaly again noted. Persistent bilateral pulmonary infiltrates/edema and bilateral pleural effusions. Similar findings on prior exam. No pneumothorax. Thoracic spine scoliosis. IMPRESSION: 1.  Lines and tubes and stable position. 2. Cardiomegaly again noted. Persistent bilateral prominent pulmonary infiltrates and/or edema again noted without significant change. Bilateral pleural effusions, left side greater than right again noted. Electronically Signed   By: Marcello Moores  Register   On: 04/06/2019 06:39   Dg Chest Port 1 View  Result Date: 04/05/2019 CLINICAL DATA:  Respiratory failure, hypertension, atrial fibrillation, former smoker EXAM: PORTABLE CHEST 1 VIEW COMPARISON:  Portable exam 0520 hours compared to 04/04/2019 FINDINGS: Tip of endotracheal tube projects 2.5 cm above carina. Nasogastric tube extends into stomach. LEFT arm PICC line with tip projecting over RIGHT atrium. Enlargement of cardiac silhouette with pulmonary vascular congestion. Persistent perihilar to basilar infiltrates bilaterally. Persistent LEFT pleural effusion. No pneumothorax. IMPRESSION: Persistent pulmonary infiltrates and LEFT pleural effusion. Enlargement of cardiac silhouette with pulmonary vascular congestion. Electronically Signed   By: Lavonia Dana M.D.   On: 04/05/2019 08:20         Scheduled Meds: . chlorhexidine gluconate (MEDLINE KIT)  15 mL Mouth Rinse BID  . Chlorhexidine Gluconate Cloth  6 each Topical Daily  . feeding supplement (OSMOLITE 1.5 CAL)  1,000 mL Per Tube Q24H  . feeding supplement (PRO-STAT SUGAR FREE 64)  30 mL Per Tube TID  . free water  200 mL Per Tube Q8H  . insulin aspart  5 Units Subcutaneous Q4H  . levalbuterol  0.63 mg Nebulization TID  . mouth rinse  15 mL Mouth Rinse 10 times per day  . methylPREDNISolone (SOLU-MEDROL) injection  40 mg Intravenous Q12H  . metoprolol tartrate  25 mg Oral Q6H  . pantoprazole (PROTONIX) IV  40 mg Intravenous Q24H  . sodium chloride flush  10-40 mL Intracatheter Q12H  . sodium chloride flush  3 mL Intravenous Q12H   Continuous Infusions: . sodium chloride Stopped (04/05/19 0807)  . fentaNYL infusion INTRAVENOUS 50 mcg/hr (04/06/19 0729)  .  heparin 850 Units/hr (04/06/19 0907)     LOS: 12 days    Time spent: 30 minutes    Taelyn Nemes Darleen Crocker, DO [facility] Pager [phone]  If 7PM-7AM, please contact night-coverage [email or web address] Password [email or web address] 04/06/2019, 11:00 AM

## 2019-04-06 NOTE — Progress Notes (Signed)
I attempted to wean patient this morning. She was very sleepy. Her son woke her up to tell her goodbye for the day and she stayed awake. We discussed that I would attempt weaning as she did yesterday. I placed the ventilator in CP/PSV 12/5 as she did yesterday. I suctioned very little secretions from her at this time. She was fanning herself as if she couldn't get enough air. Her VTe was low in the mid 200s. I increased her PS to 15/5 and coached her to take long slow deep breaths. She still after about 5-8 mins was not getting good volumes in and felt short of breath. I will attempt wean again next rounds.

## 2019-04-07 ENCOUNTER — Inpatient Hospital Stay (HOSPITAL_COMMUNITY): Payer: Medicare Other

## 2019-04-07 LAB — GLUCOSE, CAPILLARY
Glucose-Capillary: 109 mg/dL — ABNORMAL HIGH (ref 70–99)
Glucose-Capillary: 113 mg/dL — ABNORMAL HIGH (ref 70–99)
Glucose-Capillary: 121 mg/dL — ABNORMAL HIGH (ref 70–99)
Glucose-Capillary: 125 mg/dL — ABNORMAL HIGH (ref 70–99)
Glucose-Capillary: 133 mg/dL — ABNORMAL HIGH (ref 70–99)
Glucose-Capillary: 64 mg/dL — ABNORMAL LOW (ref 70–99)
Glucose-Capillary: 71 mg/dL (ref 70–99)

## 2019-04-07 LAB — COMPREHENSIVE METABOLIC PANEL
ALT: 61 U/L — ABNORMAL HIGH (ref 0–44)
AST: 31 U/L (ref 15–41)
Albumin: 1.9 g/dL — ABNORMAL LOW (ref 3.5–5.0)
Alkaline Phosphatase: 74 U/L (ref 38–126)
Anion gap: 10 (ref 5–15)
BUN: 86 mg/dL — ABNORMAL HIGH (ref 8–23)
CO2: 32 mmol/L (ref 22–32)
Calcium: 8.8 mg/dL — ABNORMAL LOW (ref 8.9–10.3)
Chloride: 104 mmol/L (ref 98–111)
Creatinine, Ser: 0.97 mg/dL (ref 0.44–1.00)
GFR calc Af Amer: >60 mL/min (ref >60)
GFR calc non Af Amer: 52 mL/min — ABNORMAL LOW (ref >60)
Glucose, Bld: 186 mg/dL — ABNORMAL HIGH (ref 70–99)
Potassium: 3.6 mmol/L (ref 3.5–5.1)
Sodium: 146 mmol/L — ABNORMAL HIGH (ref 135–145)
Total Bilirubin: 0.9 mg/dL (ref 0.3–1.2)
Total Protein: 4.9 g/dL — ABNORMAL LOW (ref 6.5–8.1)

## 2019-04-07 LAB — CBC
HCT: 38.5 % (ref 36.0–46.0)
Hemoglobin: 11.9 g/dL — ABNORMAL LOW (ref 12.0–15.0)
MCH: 29.7 pg (ref 26.0–34.0)
MCHC: 30.9 g/dL (ref 30.0–36.0)
MCV: 96 fL (ref 80.0–100.0)
Platelets: 196 10*3/uL (ref 150–400)
RBC: 4.01 MIL/uL (ref 3.87–5.11)
RDW: 15.1 % (ref 11.5–15.5)
WBC: 27.3 10*3/uL — ABNORMAL HIGH (ref 4.0–10.5)
nRBC: 0 % (ref 0.0–0.2)

## 2019-04-07 LAB — PROCALCITONIN: Procalcitonin: <0.1 ng/mL

## 2019-04-07 LAB — BLOOD GAS, ARTERIAL
Acid-Base Excess: 11.4 mmol/L — ABNORMAL HIGH (ref 0.0–2.0)
Bicarbonate: 34.5 mmol/L — ABNORMAL HIGH (ref 20.0–28.0)
FIO2: 50
O2 Saturation: 90.8 %
Patient temperature: 37
pCO2 arterial: 46.2 mmHg (ref 32.0–48.0)
pH, Arterial: 7.497 — ABNORMAL HIGH (ref 7.350–7.450)
pO2, Arterial: 60.6 mmHg — ABNORMAL LOW (ref 83.0–108.0)

## 2019-04-07 LAB — HEPARIN LEVEL (UNFRACTIONATED): Heparin Unfractionated: 0.46 IU/mL (ref 0.30–0.70)

## 2019-04-07 MED ORDER — PREDNISONE 20 MG PO TABS
40.0000 mg | ORAL_TABLET | Freq: Every day | ORAL | Status: DC
  Administered 2019-04-08 – 2019-04-10 (×3): 40 mg via ORAL
  Filled 2019-04-07 (×3): qty 2

## 2019-04-07 MED ORDER — ALBUMIN HUMAN 25 % IV SOLN
25.0000 g | Freq: Four times a day (QID) | INTRAVENOUS | Status: DC
  Administered 2019-04-07 – 2019-04-08 (×4): 25 g via INTRAVENOUS
  Filled 2019-04-07: qty 100
  Filled 2019-04-07: qty 50
  Filled 2019-04-07 (×2): qty 100
  Filled 2019-04-07: qty 50

## 2019-04-07 MED ORDER — DEXTROSE 50 % IV SOLN
INTRAVENOUS | Status: AC
  Administered 2019-04-07: 50 mL
  Filled 2019-04-07: qty 50

## 2019-04-07 MED ORDER — DEXTROSE 50 % IV SOLN: 12.5000 g | INTRAVENOUS | Status: AC

## 2019-04-07 MED ORDER — FUROSEMIDE 10 MG/ML IJ SOLN
20.0000 mg | Freq: Two times a day (BID) | INTRAMUSCULAR | Status: DC
  Administered 2019-04-07 (×2): 20 mg via INTRAVENOUS
  Filled 2019-04-07 (×2): qty 2

## 2019-04-07 NOTE — Progress Notes (Signed)
Hypoglycemic Event  CBG: 60  Treatment: half amp of D50   Symptoms: None   Follow-up CBG: Time:0053 CBG Result:133  Possible Reasons for Event: unstable critical patient who is NPO and on the vent   Comments    Argus Caraher

## 2019-04-07 NOTE — Progress Notes (Signed)
Subjective: She remains anticoagulated and on the ventilator.  Chest x-ray is basically unchanged.  She still on 50%.  Objective: Vital signs in last 24 hours: Temp:  [98.1 F (36.7 C)-99.1 F (37.3 C)] 98.4 F (36.9 C) (06/20 0748) Pulse Rate:  [99] 99 (06/19 1731) Resp:  [22-28] 24 (06/20 0748) BP: (91-129)/(51-96) 129/83 (06/20 0600) SpO2:  [91 %-98 %] 92 % (06/20 0748) FiO2 (%):  [50 %] 50 % (06/20 0321) Weight:  [55.3 kg] 55.3 kg (06/20 0500) Weight change: 5 kg Last BM Date: 04/06/19  Intake/Output from previous day: 06/19 0701 - 06/20 0700 In: 1186.5 [I.V.:687.7; NG/GT:498.8] Out: 850 [Urine:850]  PHYSICAL EXAM General appearance: alert, cooperative and mild distress Resp: rhonchi bilaterally Cardio: Still in atrial fib with heart rate around 110 GI: soft, non-tender; bowel sounds normal; no masses,  no organomegaly Extremities: Some third spacing  Lab Results:  Results for orders placed or performed during the hospital encounter of 04/08/2019 (from the past 48 hour(s))  Heparin level (unfractionated)     Status: None   Collection Time: 04/05/19 10:18 AM  Result Value Ref Range   Heparin Unfractionated 0.41 0.30 - 0.70 IU/mL    Comment: (NOTE) If heparin results are below expected values, and patient dosage has  been confirmed, suggest follow up testing of antithrombin III levels. Performed at [facility], [address] [place], [place] [address]   Glucose, capillary     Status: Abnormal   Collection Time: 04/05/19 11:43 AM  Result Value Ref Range   Glucose-Capillary 133 (H) 70 - 99 mg/dL  Glucose, capillary     Status: Abnormal   Collection Time: 04/05/19  4:34 PM  Result Value Ref Range   Glucose-Capillary 159 (H) 70 - 99 mg/dL  Heparin level (unfractionated)     Status: None   Collection Time: 04/05/19  8:06 PM  Result Value Ref Range   Heparin Unfractionated 0.38 0.30 - 0.70 IU/mL    Comment: (NOTE) If heparin results are below expected values, and  patient dosage has  been confirmed, suggest follow up testing of antithrombin III levels. Performed at [facility], [address], [place], [place] [address]   Glucose, capillary     Status: Abnormal   Collection Time: 04/05/19  8:17 PM  Result Value Ref Range   Glucose-Capillary 157 (H) 70 - 99 mg/dL  Glucose, capillary     Status: Abnormal   Collection Time: 04/06/19 12:17 AM  Result Value Ref Range   Glucose-Capillary 160 (H) 70 - 99 mg/dL  CBC     Status: Abnormal   Collection Time: 04/06/19  4:04 AM  Result Value Ref Range   WBC 26.9 (H) 4.0 - 10.5 K/uL   RBC 3.66 (L) 3.87 - 5.11 MIL/uL   Hemoglobin 10.8 (L) 12.0 - 15.0 g/dL   HCT 35.4 (L) 36.0 - 46.0 %   MCV 96.7 80.0 - 100.0 fL   MCH 29.5 26.0 - 34.0 pg   MCHC 30.5 30.0 - 36.0 g/dL   RDW 14.9 11.5 - 15.5 %   Platelets 182 150 - 400 K/uL   nRBC 0.0 0.0 - 0.2 %    Comment: Performed at [facility], [address], [place], [place] [address]  Procalcitonin     Status: None   Collection Time: 04/06/19  4:04 AM  Result Value Ref Range   Procalcitonin 0.16 ng/mL    Comment:        Interpretation: PCT (Procalcitonin) <= 0.5 ng/mL: Systemic infection (sepsis)  is not likely. Local bacterial infection is possible. (NOTE)       Sepsis PCT Algorithm           Lower Respiratory Tract                                      Infection PCT Algorithm    ----------------------------     ----------------------------         PCT < 0.25 ng/mL                PCT < 0.10 ng/mL         Strongly encourage             Strongly discourage   discontinuation of antibiotics    initiation of antibiotics    ----------------------------     -----------------------------       PCT 0.25 - 0.50 ng/mL            PCT 0.10 - 0.25 ng/mL               OR       >80% decrease in PCT            Discourage initiation of                                            antibiotics      Encourage discontinuation           of antibiotics     ----------------------------     -----------------------------         PCT >= 0.50 ng/mL              PCT 0.26 - 0.50 ng/mL               AND        <80% decrease in PCT             Encourage initiation of                                             antibiotics       Encourage continuation           of antibiotics    ----------------------------     -----------------------------        PCT >= 0.50 ng/mL                  PCT > 0.50 ng/mL               AND         increase in PCT                  Strongly encourage                                      initiation of antibiotics    Strongly encourage escalation           of antibiotics                                     -----------------------------  PCT <= 0.25 ng/mL                                                 OR                                        > 80% decrease in PCT                                     Discontinue / Do not initiate                                             antibiotics Performed at [facility], [address] [place], [place] [address]   Lactic acid, plasma     Status: None   Collection Time: 04/06/19  4:04 AM  Result Value Ref Range   Lactic Acid, Venous 1.9 0.5 - 1.9 mmol/L    Comment: Performed at [facility], [address] [place], [place] [address]  Comprehensive metabolic panel     Status: Abnormal   Collection Time: 04/06/19  4:04 AM  Result Value Ref Range   Sodium 151 (H) 135 - 145 mmol/L   Potassium 4.0 3.5 - 5.1 mmol/L   Chloride 110 98 - 111 mmol/L   CO2 31 22 - 32 mmol/L   Glucose, Bld 171 (H) 70 - 99 mg/dL   BUN 94 (H) 8 - 23 mg/dL   Creatinine, Ser 1.17 (H) 0.44 - 1.00 mg/dL   Calcium 8.9 8.9 - 10.3 mg/dL   Total Protein 4.6 (L) 6.5 - 8.1 g/dL   Albumin 1.8 (L) 3.5 - 5.0 g/dL   AST 32 15 - 41 U/L   ALT 65 (H) 0 - 44 U/L   Alkaline Phosphatase 69 38 - 126 U/L   Total Bilirubin 0.8 0.3 - 1.2 mg/dL   GFR calc non Af Amer 42 (L) >60 mL/min    GFR calc Af Amer 48 (L) >60 mL/min   Anion gap 10 5 - 15    Comment: Performed at [facility], [address] [place], [place] [address]  Heparin level (unfractionated)     Status: None   Collection Time: 04/06/19  4:04 AM  Result Value Ref Range   Heparin Unfractionated 0.50 0.30 - 0.70 IU/mL    Comment: (NOTE) If heparin results are below expected values, and patient dosage has  been confirmed, suggest follow up testing of antithrombin III levels. Performed at [facility], [address], [place], [place] [address]   Glucose, capillary     Status: Abnormal   Collection Time: 04/06/19  4:36 AM  Result Value Ref Range   Glucose-Capillary 150 (H) 70 - 99 mg/dL  Blood gas, arterial     Status: Abnormal   Collection Time: 04/06/19  5:00 AM  Result Value Ref Range   FIO2 50.00    pH, Arterial 7.470 (H) 7.350 - 7.450   pCO2 arterial 49.6 (H) 32.0 - 48.0 mmHg   pO2, Arterial 61.5 (L) 83.0 - 108.0 mmHg   Bicarbonate 34.2 (H) 20.0 -  28.0 mmol/L   Acid-Base Excess 11.2 (H) 0.0 - 2.0 mmol/L   O2 Saturation 90.8 %   Patient temperature 37.1    Allens test (pass/fail) PASS PASS    Comment: Performed at [facility], [address] [place], [place] [address]  Glucose, capillary     Status: Abnormal   Collection Time: 04/06/19  7:40 AM  Result Value Ref Range   Glucose-Capillary 147 (H) 70 - 99 mg/dL  Glucose, capillary     Status: Abnormal   Collection Time: 04/06/19 11:05 AM  Result Value Ref Range   Glucose-Capillary 133 (H) 70 - 99 mg/dL  Glucose, capillary     Status: Abnormal   Collection Time: 04/06/19  4:11 PM  Result Value Ref Range   Glucose-Capillary 107 (H) 70 - 99 mg/dL  Glucose, capillary     Status: Abnormal   Collection Time: 04/06/19  7:41 PM  Result Value Ref Range   Glucose-Capillary 109 (H) 70 - 99 mg/dL  Glucose, capillary     Status: Abnormal   Collection Time: 04/06/19 11:52 PM  Result Value Ref Range   Glucose-Capillary 60 (L) 70 - 99 mg/dL  Glucose,  capillary     Status: Abnormal   Collection Time: 04/07/19 12:23 AM  Result Value Ref Range   Glucose-Capillary 64 (L) 70 - 99 mg/dL  Glucose, capillary     Status: Abnormal   Collection Time: 04/07/19 12:51 AM  Result Value Ref Range   Glucose-Capillary 133 (H) 70 - 99 mg/dL  Blood gas, arterial     Status: Abnormal   Collection Time: 04/07/19  4:00 AM  Result Value Ref Range   FIO2 50.00    pH, Arterial 7.497 (H) 7.350 - 7.450   pCO2 arterial 46.2 32.0 - 48.0 mmHg   pO2, Arterial 60.6 (L) 83.0 - 108.0 mmHg   Bicarbonate 34.5 (H) 20.0 - 28.0 mmol/L   Acid-Base Excess 11.4 (H) 0.0 - 2.0 mmol/L   O2 Saturation 90.8 %   Patient temperature 37.0    Allens test (pass/fail) PASS PASS    Comment: Performed at [facility], [address], [place], [place] [address]  Glucose, capillary     Status: Abnormal   Collection Time: 04/07/19  4:01 AM  Result Value Ref Range   Glucose-Capillary 113 (H) 70 - 99 mg/dL  CBC     Status: Abnormal   Collection Time: 04/07/19  4:39 AM  Result Value Ref Range   WBC 27.3 (H) 4.0 - 10.5 K/uL   RBC 4.01 3.87 - 5.11 MIL/uL   Hemoglobin 11.9 (L) 12.0 - 15.0 g/dL   HCT 38.5 36.0 - 46.0 %   MCV 96.0 80.0 - 100.0 fL   MCH 29.7 26.0 - 34.0 pg   MCHC 30.9 30.0 - 36.0 g/dL   RDW 15.1 11.5 - 15.5 %   Platelets 196 150 - 400 K/uL   nRBC 0.0 0.0 - 0.2 %    Comment: Performed at [facility], [address], [place], [place] [address]  Heparin level (unfractionated)     Status: None   Collection Time: 04/07/19  4:39 AM  Result Value Ref Range   Heparin Unfractionated 0.46 0.30 - 0.70 IU/mL    Comment: (NOTE) If heparin results are below expected values, and patient dosage has  been confirmed, suggest follow up testing of antithrombin III levels. Performed at [facility], [address], [place], [place] [address]   Comprehensive metabolic panel     Status: Abnormal  Collection Time: 04/07/19  4:39 AM  Result Value Ref Range   Sodium 146 (H) 135 - 145  mmol/L   Potassium 3.6 3.5 - 5.1 mmol/L   Chloride 104 98 - 111 mmol/L   CO2 32 22 - 32 mmol/L   Glucose, Bld 186 (H) 70 - 99 mg/dL   BUN 86 (H) 8 - 23 mg/dL   Creatinine, Ser 0.97 0.44 - 1.00 mg/dL   Calcium 8.8 (L) 8.9 - 10.3 mg/dL   Total Protein 4.9 (L) 6.5 - 8.1 g/dL   Albumin 1.9 (L) 3.5 - 5.0 g/dL   AST 31 15 - 41 U/L   ALT 61 (H) 0 - 44 U/L   Alkaline Phosphatase 74 38 - 126 U/L   Total Bilirubin 0.9 0.3 - 1.2 mg/dL   GFR calc non Af Amer 52 (L) >60 mL/min   GFR calc Af Amer >60 >60 mL/min   Anion gap 10 5 - 15    Comment: Performed at [facility], [address], [place],  [address]  Procalcitonin     Status: None   Collection Time: 04/07/19  4:40 AM  Result Value Ref Range   Procalcitonin <0.10 ng/mL    Comment:        Interpretation: PCT (Procalcitonin) <= 0.5 ng/mL: Systemic infection (sepsis) is not likely. Local bacterial infection is possible. (NOTE)       Sepsis PCT Algorithm           Lower Respiratory Tract                                      Infection PCT Algorithm    ----------------------------     ----------------------------         PCT < 0.25 ng/mL                PCT < 0.10 ng/mL         Strongly encourage             Strongly discourage   discontinuation of antibiotics    initiation of antibiotics    ----------------------------     -----------------------------       PCT 0.25 - 0.50 ng/mL            PCT 0.10 - 0.25 ng/mL               OR       >80% decrease in PCT            Discourage initiation of                                            antibiotics      Encourage discontinuation           of antibiotics    ----------------------------     -----------------------------         PCT >= 0.50 ng/mL              PCT 0.26 - 0.50 ng/mL               AND        <80% decrease in PCT             Encourage initiation of  antibiotics       Encourage continuation           of antibiotics     ----------------------------     -----------------------------        PCT >= 0.50 ng/mL                  PCT > 0.50 ng/mL               AND         increase in PCT                  Strongly encourage                                      initiation of antibiotics    Strongly encourage escalation           of antibiotics                                     -----------------------------                                           PCT <= 0.25 ng/mL                                                 OR                                        > 80% decrease in PCT                                     Discontinue / Do not initiate                                             antibiotics Performed at [facility], [address], [place], [place] [address]   Glucose, capillary     Status: Abnormal   Collection Time: 04/07/19  7:47 AM  Result Value Ref Range   Glucose-Capillary 109 (H) 70 - 99 mg/dL    ABGS Recent Labs    04/07/19 0400  PHART 7.497*  PO2ART 60.6*  HCO3 34.5*   CULTURES No results found for this or any previous visit (from the past 240 hour(s)). Studies/Results: Dg Chest Port 1 View  Result Date: 04/06/2019 CLINICAL DATA:  Respiratory failure. EXAM: PORTABLE CHEST 1 VIEW COMPARISON:  04/05/2019. FINDINGS: Endotracheal tube and left PICC line stable position. Cardiomegaly again noted. Persistent bilateral pulmonary infiltrates/edema and bilateral pleural effusions. Similar findings on prior exam. No pneumothorax. Thoracic spine scoliosis. IMPRESSION: 1.  Lines and tubes and stable position. 2. Cardiomegaly again noted. Persistent bilateral prominent pulmonary infiltrates and/or edema again noted without significant change. Bilateral pleural effusions, left side greater than right again noted. Electronically Signed  By: Milton   On: 04/06/2019 06:39    Medications:  Prior to Admission:  Medications Prior to Admission  Medication Sig Dispense Refill Last Dose  .  apixaban (ELIQUIS) 2.5 MG TABS tablet Take 2.5 mg by mouth 2 (two) times daily.    03/24/2019 at Unknown time  . aspirin 81 MG tablet Take 81 mg by mouth daily.   unknown  . Calcium Carbonate-Vitamin D (CALCIUM + D PO) Take 1 tablet by mouth daily.    unknown  . digoxin (LANOXIN) 0.125 MG tablet Take 0.125 mg by mouth daily.   03/24/2019 at Unknown time  . furosemide (LASIX) 20 MG tablet Take 20 mg by mouth See admin instructions. Take one tablet by mouth daily 5 days per week   03/23/2019 at Unknown time  . losartan (COZAAR) 50 MG tablet Take 50 mg by mouth daily.    03/24/2019 at Unknown time  . metoprolol tartrate (LOPRESSOR) 100 MG tablet Take 100 mg by mouth 2 (two) times daily.    03/24/2019 at Unknown time  . Multiple Vitamin (MULTIVITAMIN) tablet Take 1 tablet by mouth daily.   03/24/2019  . ondansetron (ZOFRAN) 4 MG tablet Take 4 mg by mouth daily as needed for nausea or vomiting.   03/24/2019 at Unknown time  . potassium chloride (K-DUR) 10 MEQ tablet Take 10 mEq by mouth See admin instructions. Take one tablet daily by mouth 5 days per week   03/23/2019 at Unknown time  . Cholecalciferol (VITAMIN D PO) Take 1 capsule by mouth daily.    03/24/2019   Scheduled: . chlorhexidine gluconate (MEDLINE KIT)  15 mL Mouth Rinse BID  . Chlorhexidine Gluconate Cloth  6 each Topical Daily  . feeding supplement (OSMOLITE 1.5 CAL)  1,000 mL Per Tube Q24H  . feeding supplement (PRO-STAT SUGAR FREE 64)  30 mL Per Tube TID  . free water  200 mL Per Tube Q8H  . insulin aspart  5 Units Subcutaneous Q4H  . levalbuterol  0.63 mg Nebulization TID  . mouth rinse  15 mL Mouth Rinse 10 times per day  . methylPREDNISolone (SOLU-MEDROL) injection  40 mg Intravenous Q12H  . metoprolol tartrate  25 mg Oral Q6H  . pantoprazole (PROTONIX) IV  40 mg Intravenous Q24H  . sodium chloride flush  10-40 mL Intracatheter Q12H  . sodium chloride flush  3 mL Intravenous Q12H   Continuous: . sodium chloride Stopped (04/05/19 0807)  .  fentaNYL infusion INTRAVENOUS 25 mcg/hr (04/06/19 1816)  . heparin 850 Units/hr (04/06/19 1816)   DJT:TSVXBL chloride, acetaminophen **OR** acetaminophen, albuterol, bisacodyl, fentaNYL, metoprolol tartrate, midazolam, midazolam, ondansetron **OR** ondansetron (ZOFRAN) IV, phenol, polyethylene glycol, sodium chloride flush, sodium chloride flush, traZODone  Assesment: She was admitted with small bowel obstruction was improving when she developed severe respiratory distress which culminated in her being intubated and placed on mechanical ventilation.  She had ARDS.  She is improving.  Chest x-ray has not changed significantly in the last few days but is much better than when she was put on the ventilator.  She has been trying to breathe on her own and she did well on the 18th but not so well on the 19th.  She complains of being hot.  She had pneumonia from aspiration and has completed the antibiotics  Small bowel obstruction is better and she is tolerating tube feedings but her albumin is still only 1.9  She had acute diastolic heart failure and she has been off the Lasix.  She has had atrial fib with RVR and she is being treated.  Ventricular response is still somewhat high  We have had some trouble with her blood pressures being soft and that is generally a little bit better Principal Problem:   SBO (small bowel obstruction) (HCC) Active Problems:   Essential hypertension   Chronic a-fib   PNA (pneumonia)--Aspiration   Respiratory failure (HCC)   Acute diastolic CHF (congestive heart failure) (HCC)   Atrial fibrillation with RVR (HCC)   Elevated troponin   Lobar pneumonia (HCC)   Coronary artery disease due to lipid rich plaque   Hypotension   Ventilator dependence ([place])   Goals of care, counseling/discussion   Palliative care encounter    Plan: Continue weaning process.  Will discuss with hospitalist attending    LOS: 13 days   Alonza Bogus 04/07/2019, 9:02 AM

## 2019-04-07 NOTE — Progress Notes (Signed)
ANTICOAGULATION CONSULT NOTE - Follow Up Consult  Pharmacy Consult for heparin Indication: atrial fibrillation  HEPARIN DW (KG): 47.6   Labs: Recent Labs    04/05/19 0407  04/05/19 2006 04/06/19 0404 04/07/19 0439  HGB 11.7*  --   --  10.8* 11.9*  HCT 37.2  --   --  35.4* 38.5  PLT 185  --   --  182 196  HEPARINUNFRC  --    < > 0.38 0.50 0.46  CREATININE 1.17*  --   --  1.17* 0.97   < > = values in this interval not displayed.    Assessment: 85yo female with history of atrial fibrillation. Heparin is therapeutic now at 0.46 IU/mL.   Goal of Therapy:  Heparin level 0.3-0.7 units/ml  Monitor platelets per protocol   Plan:  Continue heparin infusion at 850 units/hr. Heparin level daily while on heparin. Continue to monitor H&H and s/s of bleeding.  Despina Pole, Pharm. D. Clinical Pharmacist 04/07/2019 8:43 AM

## 2019-04-07 NOTE — Progress Notes (Signed)
PROGRESS NOTE    Carrie Morales  QVZ:[id number] DOB: [birth year] DOA: 04/06/2019 PCP: Earney Mallet, MD   Brief Narrative:  Per HPI: 85 year old female with a history of chronic atrial fibrillation on apixaban, diastolic CHF, hypertension, bowel obstruction status post bowel resection 30 years ago presenting with 1 week history of abdominal pain that worsened over the past 2 to 3 days to the point of resulting in nausea and vomiting. The patient was also having constipation-like symptoms and tried some over-the-counter laxatives without relief. Because of her progressive symptoms she presented for further evaluation. CT of the abdomen and pelvis in the ED showed left basilar collapse less consolidative changes, small bilateral pleural effusions, left greater than right with a large hiatus hernia. There was also dilatation of the proximal and mid small bowel loops up to 2.9 cm consistent with a high-grade SBO. The patient was treated with bowel rest and IV fluids. When she arrived to the medical floor, the patient developed atrial fibrillation with RVR and respiratory distress. She was transferred to the stepdown unit and started on diltiazem drip and placed on BiPAP. Subsequently, the patient became hypotensive requiring fluid resuscitation. Repeat chest x-ray show worsening bilateral perihilar densities with stable bibasilar opacifications. Lactic acid peaked at 4.4. The patient was started on ceftriaxone and metronidazole.  She has now completed course of treatment with Merrem and continues to remain on the ventilator due to hypoxemic and hypercapnic respiratory failure. She has been weaned off amiodarone on 6/17 and remains in atrial fibrillation, but is rate controlled and on heparin drip.  She will be given some albumin as well as further diuresis on 6/20.  Steroids have also been weaned on 6/20.  She continues to do some ventilator weaning daily.   Assessment & Plan:    Principal Problem:   SBO (small bowel obstruction) (HCC) Active Problems:   Essential hypertension   Chronic a-fib   PNA (pneumonia)--Aspiration   Respiratory failure (HCC)   Acute diastolic CHF (congestive heart failure) (HCC)   Atrial fibrillation with RVR (HCC)   Elevated troponin   Lobar pneumonia (HCC)   Coronary artery disease due to lipid rich plaque   Hypotension   Ventilator dependence (HCC)   Goals of care, counseling/discussion   Palliative care encounter   Sepsis secondary to pneumonia-resolved -Merrem has completed 6/17, leukocytosis now downtrending -Procalcitonin low -Blood cultures with no growth and urine analysis negative  Acute hypoxemic and hypercapnic respiratory failure secondarytoabove -Associated ARDS as well as acute on chronic heart failure noted -Appreciate pulmonology assistance and recommendations with FiO250%% today. -Solumedrol changed to prednisone to Pulmonology -Chest x-ray demonstratingongoing pulmonary vascular congestion and ABG stable; repeat pending -Attempting ventilator weaning daily  Small bowel obstruction-resolved -SBO has resolved and appreciate general surgery recommendations -Continue electrolyte supplementation as tolerated -Continuecurrenttube feeds  Atrial fibrillation with RVR-controlled -Appreciate cardiology recommendations with initiation of oral metoprolol and weaning of amiodarone today -Continue heparin drip  Acute on chronic diastolic CHF -Continue to monitor I's and O's as well as daily weights -Lasix restarted today with albumin by Pulmonology -Chest x-ray continues to demonstrate pulmonary vascular congestion with repeat performed today 6/19  AKI on CKD stage III (baseline creatinine 0.8-1.1) -Stable and improving -Urine output of 400 noted and no further Lasix  Pericardial effusion with MR -Repeat echocardiogram per cardiology performed 6/18 with no significant worsening effusion -No  tamponade physiology noted  Mild hyperglycemia-likely steroid-induced -A1c 5.3% -Continue close monitoring  Mild diarrhea-resolved -Continue to monitor, but likely was due to  tube feeding change  Hypoalbuminemia -Albumin infusions ordered today   DVT prophylaxis:IV heparin Code Status:Full Family Communication:Updated son, Nicole Kindred, at bedside on 6/20 Disposition Plan:Continue current management and wean ventilator as tolerated. Restart Lasix and Albumin and wean steroids.   Consultants:  General surgery  Cardiology  Pulmonology  Procedures:  None  Antimicrobials: Ceftriaxone/flagyl 6/7>>6/8 Merrem 6/8>>>6/17   Subjective: Patient seen and evaluated today with no new acute complaints or concerns. No acute concerns or events noted overnight. She had a BM this am.  Objective: Vitals:   04/07/19 0400 04/07/19 0500 04/07/19 0600 04/07/19 0748  BP: (!) 113/55 121/66 129/83   Pulse:      Resp: (!) 24 (!) 24 (!) 28 (!) 24  Temp: 98.8 F (37.1 C) 98.4 F (36.9 C) 98.2 F (36.8 C) 98.4 F (36.9 C)  TempSrc:      SpO2:  92% 91% 92%  Weight:  55.3 kg    Height:        Intake/Output Summary (Last 24 hours) at 04/07/2019 1026 Last data filed at 04/07/2019 0556 Gross per 24 hour  Intake 739.24 ml  Output 850 ml  Net -110.76 ml   Filed Weights   04/04/19 0500 04/06/19 0632 04/07/19 0500  Weight: 49.6 kg 50.3 kg 55.3 kg    Examination:  General exam: Appears calm and comfortable  Respiratory system: Clear to auscultation. Respiratory effort normal.  Currently intubated on FiO2 50%. Cardiovascular system: S1 & S2 heard, RRR. No JVD, murmurs, rubs, gallops or clicks. No pedal edema. Gastrointestinal system: Abdomen is nondistended, soft and nontender. No organomegaly or masses felt. Normal bowel sounds heard. Central nervous system: Alert and oriented. No focal neurological deficits. Extremities: Symmetric 5 x 5 power. Skin: No rashes, lesions  or ulcers Psychiatry: Judgement and insight appear normal. Mood & affect appropriate.     Data Reviewed: I have personally reviewed following labs and imaging studies  CBC: Recent Labs  Lab 04/03/19 0508 04/04/19 0408 04/05/19 0407 04/06/19 0404 04/07/19 0439  WBC 20.6* 27.6* 29.7* 26.9* 27.3*  HGB 12.6 11.9* 11.7* 10.8* 11.9*  HCT 40.5 38.7 37.2 35.4* 38.5  MCV 95.5 95.8 96.1 96.7 96.0  PLT 228 211 185 182 174   Basic Metabolic Panel: Recent Labs  Lab 03/31/19 1245 03/31/19 1919 04/01/19 0450 04/01/19 1711  04/03/19 0508 04/04/19 0408 04/05/19 0407 04/06/19 0404 04/07/19 0439  NA  --   --  143  --    < > 148* 149* 151* 151* 146*  K  --   --  4.4  --    < > 3.1* 3.3* 3.8 4.0 3.6  CL  --   --  107  --    < > 106 105 109 110 104  CO2  --   --  30  --    < > '30 28 31 31 '$ 32  GLUCOSE  --   --  188*  --    < > 166* 196* 145* 171* 186*  BUN  --   --  47*  --    < > 75* 80* 93* 94* 86*  CREATININE  --   --  1.01*  --    < > 0.98 1.01* 1.17* 1.17* 0.97  CALCIUM  --   --  9.9  --    < > 9.5 9.1 9.1 8.9 8.8*  MG 2.1 2.1 2.2 2.1  --   --  2.0 2.1  --   --   PHOS 2.7 3.0 2.9  2.9  --   --   --   --   --   --    < > = values in this interval not displayed.   GFR: Estimated Creatinine Clearance: 34.6 mL/min (by C-G formula based on SCr of 0.97 mg/dL). Liver Function Tests: Recent Labs  Lab 04/04/19 0408 04/06/19 0404 04/07/19 0439  AST 50* 32 31  ALT 93* 65* 61*  ALKPHOS 76 69 74  BILITOT 0.5 0.8 0.9  PROT 4.6* 4.6* 4.9*  ALBUMIN 1.9* 1.8* 1.9*   No results for input(s): LIPASE, AMYLASE in the last 168 hours. No results for input(s): AMMONIA in the last 168 hours. Coagulation Profile: No results for input(s): INR, PROTIME in the last 168 hours. Cardiac Enzymes: No results for input(s): CKTOTAL, CKMB, CKMBINDEX, TROPONINI in the last 168 hours. BNP (last 3 results) No results for input(s): PROBNP in the last 8760 hours. HbA1C: No results for input(s): HGBA1C in the  last 72 hours. CBG: Recent Labs  Lab 04/06/19 2352 04/07/19 0023 04/07/19 0051 04/07/19 0401 04/07/19 0747  GLUCAP 60* 64* 133* 113* 109*   Lipid Profile: No results for input(s): CHOL, HDL, LDLCALC, TRIG, CHOLHDL, LDLDIRECT in the last 72 hours. Thyroid Function Tests: No results for input(s): TSH, T4TOTAL, FREET4, T3FREE, THYROIDAB in the last 72 hours. Anemia Panel: No results for input(s): VITAMINB12, FOLATE, FERRITIN, TIBC, IRON, RETICCTPCT in the last 72 hours. Sepsis Labs: Recent Labs  Lab 04/05/19 0813 04/06/19 0404 04/07/19 0440  PROCALCITON 0.16 0.16 <0.10  LATICACIDVEN  --  1.9  --     No results found for this or any previous visit (from the past 240 hour(s)).       Radiology Studies: Dg Chest Port 1 View  Result Date: 04/07/2019 CLINICAL DATA:  Respiratory failure EXAM: PORTABLE CHEST 1 VIEW COMPARISON:  Chest radiograph from one day prior. FINDINGS: Enteric tube enters stomach with the tip not seen on this image. Endotracheal tube tip is 2.5 cm above the carina. Left PICC terminates at the cavoatrial junction. Stable cardiomediastinal silhouette with mild cardiomegaly. No pneumothorax. Stable small bilateral pleural effusions, left greater than right. Severe patchy parahilar and bibasilar lung opacities bilaterally, slightly worsened. IMPRESSION: 1. Well-positioned support structures.  No pneumothorax. 2. Stable small bilateral pleural effusions, left greater than right. 3. Stable cardiomegaly. Slight worsening of severe patchy parahilar and bibasilar lung opacities, favor a combination of pulmonary edema and atelectasis. Electronically Signed   By: Ilona Sorrel M.D.   On: 04/07/2019 09:34   Dg Chest Port 1 View  Result Date: 04/06/2019 CLINICAL DATA:  Respiratory failure. EXAM: PORTABLE CHEST 1 VIEW COMPARISON:  04/05/2019. FINDINGS: Endotracheal tube and left PICC line stable position. Cardiomegaly again noted. Persistent bilateral pulmonary infiltrates/edema  and bilateral pleural effusions. Similar findings on prior exam. No pneumothorax. Thoracic spine scoliosis. IMPRESSION: 1.  Lines and tubes and stable position. 2. Cardiomegaly again noted. Persistent bilateral prominent pulmonary infiltrates and/or edema again noted without significant change. Bilateral pleural effusions, left side greater than right again noted. Electronically Signed   By: Marcello Moores  Register   On: 04/06/2019 06:39        Scheduled Meds: . chlorhexidine gluconate (MEDLINE KIT)  15 mL Mouth Rinse BID  . Chlorhexidine Gluconate Cloth  6 each Topical Daily  . feeding supplement (OSMOLITE 1.5 CAL)  1,000 mL Per Tube Q24H  . feeding supplement (PRO-STAT SUGAR FREE 64)  30 mL Per Tube TID  . free water  200 mL Per Tube Q8H  .  furosemide  20 mg Intravenous Q12H  . insulin aspart  5 Units Subcutaneous Q4H  . levalbuterol  0.63 mg Nebulization TID  . mouth rinse  15 mL Mouth Rinse 10 times per day  . metoprolol tartrate  25 mg Oral Q6H  . pantoprazole (PROTONIX) IV  40 mg Intravenous Q24H  . predniSONE  40 mg Oral Q breakfast  . sodium chloride flush  10-40 mL Intracatheter Q12H  . sodium chloride flush  3 mL Intravenous Q12H   Continuous Infusions: . sodium chloride Stopped (04/05/19 0807)  . albumin human    . fentaNYL infusion INTRAVENOUS 25 mcg/hr (04/06/19 1816)  . heparin 850 Units/hr (04/06/19 1816)     LOS: 13 days    Time spent: 30 minutes    Izel Hochberg Darleen Crocker, DO [facility] Pager [phone]  If 7PM-7AM, please contact night-coverage [email or web address] Password [email or web address] 04/07/2019, 10:26 AM

## 2019-04-08 ENCOUNTER — Inpatient Hospital Stay (HOSPITAL_COMMUNITY): Payer: Medicare Other

## 2019-04-08 LAB — BLOOD GAS, ARTERIAL
Acid-Base Excess: 9.1 mmol/L — ABNORMAL HIGH (ref 0.0–2.0)
Bicarbonate: 32.1 mmol/L — ABNORMAL HIGH (ref 20.0–28.0)
FIO2: 50
O2 Saturation: 82.6 %
Patient temperature: 37.1
pCO2 arterial: 46.2 mmHg (ref 32.0–48.0)
pH, Arterial: 7.47 — ABNORMAL HIGH (ref 7.350–7.450)
pO2, Arterial: 48.4 mmHg — ABNORMAL LOW (ref 83.0–108.0)

## 2019-04-08 LAB — GLUCOSE, CAPILLARY
Glucose-Capillary: 113 mg/dL — ABNORMAL HIGH (ref 70–99)
Glucose-Capillary: 127 mg/dL — ABNORMAL HIGH (ref 70–99)
Glucose-Capillary: 148 mg/dL — ABNORMAL HIGH (ref 70–99)
Glucose-Capillary: 151 mg/dL — ABNORMAL HIGH (ref 70–99)
Glucose-Capillary: 158 mg/dL — ABNORMAL HIGH (ref 70–99)
Glucose-Capillary: 160 mg/dL — ABNORMAL HIGH (ref 70–99)
Glucose-Capillary: 164 mg/dL — ABNORMAL HIGH (ref 70–99)

## 2019-04-08 LAB — CBC
HCT: 37.2 % (ref 36.0–46.0)
Hemoglobin: 11.2 g/dL — ABNORMAL LOW (ref 12.0–15.0)
MCH: 29.4 pg (ref 26.0–34.0)
MCHC: 30.1 g/dL (ref 30.0–36.0)
MCV: 97.6 fL (ref 80.0–100.0)
Platelets: 188 10*3/uL (ref 150–400)
RBC: 3.81 MIL/uL — ABNORMAL LOW (ref 3.87–5.11)
RDW: 15.1 % (ref 11.5–15.5)
WBC: 21.8 10*3/uL — ABNORMAL HIGH (ref 4.0–10.5)
nRBC: 0 % (ref 0.0–0.2)

## 2019-04-08 LAB — COMPREHENSIVE METABOLIC PANEL
ALT: 40 U/L (ref 0–44)
AST: 17 U/L (ref 15–41)
Albumin: 3.3 g/dL — ABNORMAL LOW (ref 3.5–5.0)
Alkaline Phosphatase: 69 U/L (ref 38–126)
Anion gap: 10 (ref 5–15)
BUN: 88 mg/dL — ABNORMAL HIGH (ref 8–23)
CO2: 31 mmol/L (ref 22–32)
Calcium: 9.5 mg/dL (ref 8.9–10.3)
Chloride: 105 mmol/L (ref 98–111)
Creatinine, Ser: 1.02 mg/dL — ABNORMAL HIGH (ref 0.44–1.00)
GFR calc Af Amer: 57 mL/min — ABNORMAL LOW (ref >60)
GFR calc non Af Amer: 49 mL/min — ABNORMAL LOW (ref >60)
Glucose, Bld: 187 mg/dL — ABNORMAL HIGH (ref 70–99)
Potassium: 3.7 mmol/L (ref 3.5–5.1)
Sodium: 146 mmol/L — ABNORMAL HIGH (ref 135–145)
Total Bilirubin: 1.4 mg/dL — ABNORMAL HIGH (ref 0.3–1.2)
Total Protein: 5.8 g/dL — ABNORMAL LOW (ref 6.5–8.1)

## 2019-04-08 LAB — HEPARIN LEVEL (UNFRACTIONATED): Heparin Unfractionated: 0.19 IU/mL — ABNORMAL LOW (ref 0.30–0.70)

## 2019-04-08 MED ORDER — FUROSEMIDE 10 MG/ML IJ SOLN
40.0000 mg | Freq: Two times a day (BID) | INTRAMUSCULAR | Status: DC
  Administered 2019-04-08 (×2): 40 mg via INTRAVENOUS
  Filled 2019-04-08 (×2): qty 4

## 2019-04-08 NOTE — Progress Notes (Signed)
Subjective: She is worse today.  She did receive albumin and her albumin level is up to 3.3 but she looks like she is volume overloaded.  She did receive Lasix with the albumin but was still positive greater than a liter yesterday.  She is now requiring 75% oxygen.  She motions that she is tired and still hot  Objective: Vital signs in last 24 hours: Temp:  [98.1 F (36.7 C)-99.1 F (37.3 C)] 98.6 F (37 C) (06/21 0400) Pulse Rate:  [110-130] 110 (06/21 0601) Resp:  [21-34] 26 (06/21 0705) BP: (99-152)/(52-101) 99/63 (06/21 0705) SpO2:  [86 %-100 %] 87 % (06/21 0705) FiO2 (%):  [50 %-75 %] 75 % (06/21 0705) Weight:  [53.6 kg] 53.6 kg (06/21 0500) Weight change: -1.7 kg Last BM Date: 04/07/19  Intake/Output from previous day: 06/20 0701 - 06/21 0700 In: 2809.9 [I.V.:261.6; NG/GT:2398.3; IV Piggyback:150] Out: 1400 [Urine:1400]  PHYSICAL EXAM General appearance: alert and Intubated on the ventilator Resp: rales bilaterally and rhonchi bilaterally Cardio: She remains in atrial fib with a heart rate of about 110 GI: soft, non-tender; bowel sounds normal; no masses,  no organomegaly Extremities: She still has some third spacing of fluids.  She has a little bit of distal cyanosis in her fingers  Lab Results:  Results for orders placed or performed during the hospital encounter of 04/06/2019 (from the past 48 hour(s))  Glucose, capillary     Status: Abnormal   Collection Time: 04/06/19 11:05 AM  Result Value Ref Range   Glucose-Capillary 133 (H) 70 - 99 mg/dL  Glucose, capillary     Status: Abnormal   Collection Time: 04/06/19  4:11 PM  Result Value Ref Range   Glucose-Capillary 107 (H) 70 - 99 mg/dL  Glucose, capillary     Status: Abnormal   Collection Time: 04/06/19  7:41 PM  Result Value Ref Range   Glucose-Capillary 109 (H) 70 - 99 mg/dL  Glucose, capillary     Status: Abnormal   Collection Time: 04/06/19 11:52 PM  Result Value Ref Range   Glucose-Capillary 60 (L) 70 - 99  mg/dL  Glucose, capillary     Status: Abnormal   Collection Time: 04/07/19 12:23 AM  Result Value Ref Range   Glucose-Capillary 64 (L) 70 - 99 mg/dL  Glucose, capillary     Status: Abnormal   Collection Time: 04/07/19 12:51 AM  Result Value Ref Range   Glucose-Capillary 133 (H) 70 - 99 mg/dL  Blood gas, arterial     Status: Abnormal   Collection Time: 04/07/19  4:00 AM  Result Value Ref Range   FIO2 50.00    pH, Arterial 7.497 (H) 7.350 - 7.450   pCO2 arterial 46.2 32.0 - 48.0 mmHg   pO2, Arterial 60.6 (L) 83.0 - 108.0 mmHg   Bicarbonate 34.5 (H) 20.0 - 28.0 mmol/L   Acid-Base Excess 11.4 (H) 0.0 - 2.0 mmol/L   O2 Saturation 90.8 %   Patient temperature 37.0    Allens test (pass/fail) PASS PASS    Comment: Performed at [facility], [address] [place], [place] [address]  Glucose, capillary     Status: Abnormal   Collection Time: 04/07/19  4:01 AM  Result Value Ref Range   Glucose-Capillary 113 (H) 70 - 99 mg/dL  CBC     Status: Abnormal   Collection Time: 04/07/19  4:39 AM  Result Value Ref Range   WBC 27.3 (H) 4.0 - 10.5 K/uL   RBC 4.01 3.87 - 5.11 MIL/uL  Hemoglobin 11.9 (L) 12.0 - 15.0 g/dL   HCT 38.5 36.0 - 46.0 %   MCV 96.0 80.0 - 100.0 fL   MCH 29.7 26.0 - 34.0 pg   MCHC 30.9 30.0 - 36.0 g/dL   RDW 15.1 11.5 - 15.5 %   Platelets 196 150 - 400 K/uL   nRBC 0.0 0.0 - 0.2 %    Comment: Performed at [facility], [address], [place], [place] [address]  Heparin level (unfractionated)     Status: None   Collection Time: 04/07/19  4:39 AM  Result Value Ref Range   Heparin Unfractionated 0.46 0.30 - 0.70 IU/mL    Comment: (NOTE) If heparin results are below expected values, and patient dosage has  been confirmed, suggest follow up testing of antithrombin III levels. Performed at [facility], [address] [place], [place] [address]   Comprehensive metabolic panel     Status: Abnormal   Collection Time: 04/07/19  4:39 AM  Result Value Ref Range   Sodium 146  (H) 135 - 145 mmol/L   Potassium 3.6 3.5 - 5.1 mmol/L   Chloride 104 98 - 111 mmol/L   CO2 32 22 - 32 mmol/L   Glucose, Bld 186 (H) 70 - 99 mg/dL   BUN 86 (H) 8 - 23 mg/dL   Creatinine, Ser 0.97 0.44 - 1.00 mg/dL   Calcium 8.8 (L) 8.9 - 10.3 mg/dL   Total Protein 4.9 (L) 6.5 - 8.1 g/dL   Albumin 1.9 (L) 3.5 - 5.0 g/dL   AST 31 15 - 41 U/L   ALT 61 (H) 0 - 44 U/L   Alkaline Phosphatase 74 38 - 126 U/L   Total Bilirubin 0.9 0.3 - 1.2 mg/dL   GFR calc non Af Amer 52 (L) >60 mL/min   GFR calc Af Amer >60 >60 mL/min   Anion gap 10 5 - 15    Comment: Performed at [facility], [address] [place],  [address]  Procalcitonin     Status: None   Collection Time: 04/07/19  4:40 AM  Result Value Ref Range   Procalcitonin <0.10 ng/mL    Comment:        Interpretation: PCT (Procalcitonin) <= 0.5 ng/mL: Systemic infection (sepsis) is not likely. Local bacterial infection is possible. (NOTE)       Sepsis PCT Algorithm           Lower Respiratory Tract                                      Infection PCT Algorithm    ----------------------------     ----------------------------         PCT < 0.25 ng/mL                PCT < 0.10 ng/mL         Strongly encourage             Strongly discourage   discontinuation of antibiotics    initiation of antibiotics    ----------------------------     -----------------------------       PCT 0.25 - 0.50 ng/mL            PCT 0.10 - 0.25 ng/mL               OR       >80% decrease in PCT  Discourage initiation of                                            antibiotics      Encourage discontinuation           of antibiotics    ----------------------------     -----------------------------         PCT >= 0.50 ng/mL              PCT 0.26 - 0.50 ng/mL               AND        <80% decrease in PCT             Encourage initiation of                                             antibiotics       Encourage continuation           of  antibiotics    ----------------------------     -----------------------------        PCT >= 0.50 ng/mL                  PCT > 0.50 ng/mL               AND         increase in PCT                  Strongly encourage                                      initiation of antibiotics    Strongly encourage escalation           of antibiotics                                     -----------------------------                                           PCT <= 0.25 ng/mL                                                 OR                                        > 80% decrease in PCT                                     Discontinue / Do not initiate  antibiotics Performed at [facility], [address] [place], [place] [address]   Glucose, capillary     Status: Abnormal   Collection Time: 04/07/19  7:47 AM  Result Value Ref Range   Glucose-Capillary 109 (H) 70 - 99 mg/dL  Glucose, capillary     Status: None   Collection Time: 04/07/19 11:34 AM  Result Value Ref Range   Glucose-Capillary 71 70 - 99 mg/dL  Glucose, capillary     Status: Abnormal   Collection Time: 04/07/19  4:46 PM  Result Value Ref Range   Glucose-Capillary 121 (H) 70 - 99 mg/dL  Glucose, capillary     Status: Abnormal   Collection Time: 04/07/19  7:44 PM  Result Value Ref Range   Glucose-Capillary 125 (H) 70 - 99 mg/dL   Comment 1 Notify RN    Comment 2 Document in Chart   Glucose, capillary     Status: Abnormal   Collection Time: 04/08/19 12:02 AM  Result Value Ref Range   Glucose-Capillary 160 (H) 70 - 99 mg/dL  Glucose, capillary     Status: Abnormal   Collection Time: 04/08/19  4:01 AM  Result Value Ref Range   Glucose-Capillary 127 (H) 70 - 99 mg/dL  Heparin level (unfractionated)     Status: Abnormal   Collection Time: 04/08/19  4:16 AM  Result Value Ref Range   Heparin Unfractionated 0.19 (L) 0.30 - 0.70 IU/mL    Comment: (NOTE) If heparin results are below expected  values, and patient dosage has  been confirmed, suggest follow up testing of antithrombin III levels. Performed at [facility], [address] [place], [place] [address]   Comprehensive metabolic panel     Status: Abnormal   Collection Time: 04/08/19  4:21 AM  Result Value Ref Range   Sodium 146 (H) 135 - 145 mmol/L   Potassium 3.7 3.5 - 5.1 mmol/L   Chloride 105 98 - 111 mmol/L   CO2 31 22 - 32 mmol/L   Glucose, Bld 187 (H) 70 - 99 mg/dL   BUN 88 (H) 8 - 23 mg/dL   Creatinine, Ser 1.02 (H) 0.44 - 1.00 mg/dL   Calcium 9.5 8.9 - 10.3 mg/dL   Total Protein 5.8 (L) 6.5 - 8.1 g/dL   Albumin 3.3 (L) 3.5 - 5.0 g/dL   AST 17 15 - 41 U/L   ALT 40 0 - 44 U/L   Alkaline Phosphatase 69 38 - 126 U/L   Total Bilirubin 1.4 (H) 0.3 - 1.2 mg/dL   GFR calc non Af Amer 49 (L) >60 mL/min   GFR calc Af Amer 57 (L) >60 mL/min   Anion gap 10 5 - 15    Comment: Performed at [facility], [address], [place], [place] [address]  CBC     Status: Abnormal   Collection Time: 04/08/19  4:21 AM  Result Value Ref Range   WBC 21.8 (H) 4.0 - 10.5 K/uL   RBC 3.81 (L) 3.87 - 5.11 MIL/uL   Hemoglobin 11.2 (L) 12.0 - 15.0 g/dL   HCT 37.2 36.0 - 46.0 %   MCV 97.6 80.0 - 100.0 fL   MCH 29.4 26.0 - 34.0 pg   MCHC 30.1 30.0 - 36.0 g/dL   RDW 15.1 11.5 - 15.5 %   Platelets 188 150 - 400 K/uL   nRBC 0.0 0.0 - 0.2 %    Comment: Performed at [facility], [address] [place], [place] [address]  Blood gas, arterial  Status: Abnormal   Collection Time: 04/08/19  4:50 AM  Result Value Ref Range   FIO2 50.00    pH, Arterial 7.470 (H) 7.350 - 7.450   pCO2 arterial 46.2 32.0 - 48.0 mmHg   pO2, Arterial 48.4 (L) 83.0 - 108.0 mmHg   Bicarbonate 32.1 (H) 20.0 - 28.0 mmol/L   Acid-Base Excess 9.1 (H) 0.0 - 2.0 mmol/L   O2 Saturation 82.6 %   Patient temperature 37.1    Allens test (pass/fail) PASS PASS    Comment: Performed at [facility], [address] [place], [place] [address]    ABGS Recent Labs     04/08/19 0450  PHART 7.470*  PO2ART 48.4*  HCO3 32.1*   CULTURES No results found for this or any previous visit (from the past 240 hour(s)). Studies/Results: Dg Chest Port 1 View  Result Date: 04/07/2019 CLINICAL DATA:  Respiratory failure EXAM: PORTABLE CHEST 1 VIEW COMPARISON:  Chest radiograph from one day prior. FINDINGS: Enteric tube enters stomach with the tip not seen on this image. Endotracheal tube tip is 2.5 cm above the carina. Left PICC terminates at the cavoatrial junction. Stable cardiomediastinal silhouette with mild cardiomegaly. No pneumothorax. Stable small bilateral pleural effusions, left greater than right. Severe patchy parahilar and bibasilar lung opacities bilaterally, slightly worsened. IMPRESSION: 1. Well-positioned support structures.  No pneumothorax. 2. Stable small bilateral pleural effusions, left greater than right. 3. Stable cardiomegaly. Slight worsening of severe patchy parahilar and bibasilar lung opacities, favor a combination of pulmonary edema and atelectasis. Electronically Signed   By: Ilona Sorrel M.D.   On: 04/07/2019 09:34    Medications:  Prior to Admission:  Medications Prior to Admission  Medication Sig Dispense Refill Last Dose  . apixaban (ELIQUIS) 2.5 MG TABS tablet Take 2.5 mg by mouth 2 (two) times daily.    03/24/2019 at Unknown time  . aspirin 81 MG tablet Take 81 mg by mouth daily.   unknown  . Calcium Carbonate-Vitamin D (CALCIUM + D PO) Take 1 tablet by mouth daily.    unknown  . digoxin (LANOXIN) 0.125 MG tablet Take 0.125 mg by mouth daily.   03/24/2019 at Unknown time  . furosemide (LASIX) 20 MG tablet Take 20 mg by mouth See admin instructions. Take one tablet by mouth daily 5 days per week   03/23/2019 at Unknown time  . losartan (COZAAR) 50 MG tablet Take 50 mg by mouth daily.    03/24/2019 at Unknown time  . metoprolol tartrate (LOPRESSOR) 100 MG tablet Take 100 mg by mouth 2 (two) times daily.    03/24/2019 at Unknown time  . Multiple  Vitamin (MULTIVITAMIN) tablet Take 1 tablet by mouth daily.   03/24/2019  . ondansetron (ZOFRAN) 4 MG tablet Take 4 mg by mouth daily as needed for nausea or vomiting.   03/24/2019 at Unknown time  . potassium chloride (K-DUR) 10 MEQ tablet Take 10 mEq by mouth See admin instructions. Take one tablet daily by mouth 5 days per week   03/23/2019 at Unknown time  . Cholecalciferol (VITAMIN D PO) Take 1 capsule by mouth daily.    03/24/2019   Scheduled: . chlorhexidine gluconate (MEDLINE KIT)  15 mL Mouth Rinse BID  . Chlorhexidine Gluconate Cloth  6 each Topical Daily  . feeding supplement (OSMOLITE 1.5 CAL)  1,000 mL Per Tube Q24H  . feeding supplement (PRO-STAT SUGAR FREE 64)  30 mL Per Tube TID  . furosemide  40 mg Intravenous Q12H  . insulin aspart  5 Units Subcutaneous Q4H  . levalbuterol  0.63 mg Nebulization TID  . mouth rinse  15 mL Mouth Rinse 10 times per day  . metoprolol tartrate  25 mg Oral Q6H  . pantoprazole (PROTONIX) IV  40 mg Intravenous Q24H  . predniSONE  40 mg Oral Q breakfast  . sodium chloride flush  10-40 mL Intracatheter Q12H  . sodium chloride flush  3 mL Intravenous Q12H   Continuous: . sodium chloride Stopped (04/05/19 0807)  . fentaNYL infusion INTRAVENOUS 25 mcg/hr (04/08/19 0309)  . heparin 850 Units/hr (04/08/19 0309)   UWT:KTCCEQ chloride, acetaminophen **OR** acetaminophen, albuterol, bisacodyl, fentaNYL, metoprolol tartrate, midazolam, midazolam, ondansetron **OR** ondansetron (ZOFRAN) IV, phenol, polyethylene glycol, sodium chloride flush, sodium chloride flush, traZODone  Assesment: She was admitted with a small bowel obstruction.  She was eventually improving but then developed increasing problems with respiratory distress which culminated in her being intubated and placed on mechanical ventilation.  She has continued on the ventilator since.  She had ARDS and probably had another episode of aspiration pneumonia.  Her chest x-ray had been clearing but she looks  like she is got some volume overload now on the chest x-ray that I personally reviewed  Her oxygenation has not is good probably from volume overload  She has atrial fib and ventricular response is fairly good Principal Problem:   SBO (small bowel obstruction) (HCC) Active Problems:   Essential hypertension   Chronic a-fib   PNA (pneumonia)--Aspiration   Respiratory failure (HCC)   Acute diastolic CHF (congestive heart failure) (HCC)   Atrial fibrillation with RVR (HCC)   Elevated troponin   Lobar pneumonia (HCC)   Coronary artery disease due to lipid rich plaque   Hypotension   Ventilator dependence ([place])   Goals of care, counseling/discussion   Palliative care encounter    Plan: She will not get any more albumin.  She is got increased Lasix which I think is appropriate.  She had been coming off the ventilator periodically and breathing on her own but she has not been to be able to do that today    LOS: 14 days   Alonza Bogus 04/08/2019, 8:08 AM

## 2019-04-08 NOTE — Progress Notes (Signed)
Attempted to wean patient at this time with constant coaching from her son and myself. Her VTs were less than 275. I attempted to increase PS from 12 to 15 then to 20 still with little change in VT. The patients RR increased as well to high 30s. She continued to look increasingly tired even with our constant coaching. HR was also around 120s. Will attempt again in the morning.

## 2019-04-08 NOTE — Progress Notes (Signed)
PROGRESS NOTE    Carrie Morales  ZOX:[id number] DOB: [birth year] DOA: 03/24/2019 PCP: Earney Mallet, MD   Brief Narrative:  Per HPI: 85 year old female with a history of chronic atrial fibrillation on apixaban, diastolic CHF, hypertension, bowel obstruction status post bowel resection 30 years ago presenting with 1 week history of abdominal pain that worsened over the past 2 to 3 days to the point of resulting in nausea and vomiting. The patient was also having constipation-like symptoms and tried some over-the-counter laxatives without relief. Because of her progressive symptoms she presented for further evaluation. CT of the abdomen and pelvis in the ED showed left basilar collapse less consolidative changes, small bilateral pleural effusions, left greater than right with a large hiatus hernia. There was also dilatation of the proximal and mid small bowel loops up to 2.9 cm consistent with a high-grade SBO. The patient was treated with bowel rest and IV fluids. When she arrived to the medical floor, the patient developed atrial fibrillation with RVR and respiratory distress. She was transferred to the stepdown unit and started on diltiazem drip and placed on BiPAP. Subsequently, the patient became hypotensive requiring fluid resuscitation. Repeat chest x-ray show worsening bilateral perihilar densities with stable bibasilar opacifications. Lactic acid peaked at 4.4. The patient was started on ceftriaxone and metronidazole.  She has now completed course of treatment with Merrem and continues to remain on the ventilator due to hypoxemic and hypercapnic respiratory failure. She has been weaned off amiodarone on 6/17 and remains in atrial fibrillation, but is rate controlled and on heparin drip.  She will be given some albumin as well as further diuresis on 6/20.  Steroids have also been weaned on 6/20.  She continues to do some ventilator weaning daily.  She has received some albumin on  6/20 and is now volume overloaded and requiring high amounts of oxygen on the ventilator.  This will be discontinued on 6/21 with increase in Lasix for further diuresis today.   Assessment & Plan:   Principal Problem:   SBO (small bowel obstruction) (HCC) Active Problems:   Essential hypertension   Chronic a-fib   PNA (pneumonia)--Aspiration   Respiratory failure (HCC)   Acute diastolic CHF (congestive heart failure) (HCC)   Atrial fibrillation with RVR (HCC)   Elevated troponin   Lobar pneumonia (HCC)   Coronary artery disease due to lipid rich plaque   Hypotension   Ventilator dependence (HCC)   Goals of care, counseling/discussion   Palliative care encounter  Sepsis secondary to pneumonia-resolved -Merrem has completed 6/17,leukocytosis now downtrending -Procalcitonin low -Blood cultures with no growth and urine analysis negative  Acute hypoxemic and hypercapnic respiratory failure secondarytoabove-worsening hypoxemia with pulmonary edema -Plan for aggressive diuresis starting today and hold further fluid intake as well as albumin today. -Appreciate pulmonology assistance and recommendations with FiO250%% today. -Solumedrol changed to prednisone to Pulmonology -Chest x-ray demonstratingongoing pulmonary vascular congestion and ABG stable; repeat pending -Attempting ventilator weaning daily  Small bowel obstruction-resolved -SBO has resolved and appreciate general surgery recommendations -Continue electrolyte supplementation as tolerated -Continuecurrenttube feeds  Atrial fibrillation with RVR-controlled -Appreciate cardiology recommendations with initiation of oral metoprolol and weaning of amiodarone today -Continue heparin drip  Acute on chronic diastolic CHF -Continue to monitor I's and O's as well as daily weights -Lasix restarted today with albumin by Pulmonology -Chest x-ray continues to demonstrate pulmonary vascular congestionwith repeat  performed today 6/19  AKI on CKD stage III (baseline creatinine 0.8-1.1) -Stable and improving -Urine output of 1400 while on  Lasix which will be increased today -Repeat labs in a.m.  Pericardial effusion with MR -Repeat echocardiogram per cardiologyperformed 6/18 with no significant worsening effusion -No tamponade physiology noted  Mild hyperglycemia-likely steroid-induced -A1c 5.3% -Continue close monitoring  Mild diarrhea-resolved -Continue to monitor, but likely was due to tube feeding change  Hypoalbuminemia-improved -Discontinue further infusions due to volume overload today and monitor   DVT prophylaxis:IV heparin Code Status:Full Family Communication:Updated son, Nicole Kindred, at bedside on 6/20 Disposition Plan:Continue current management and wean ventilator as tolerated. Restart Lasix and Albumin and wean steroids.   Consultants:  General surgery  Cardiology  Pulmonology  Procedures:  None  Antimicrobials: Ceftriaxone/flagyl 6/7>>6/8 Merrem 6/8>>>6/17  Subjective: Patient seen and evaluated today with ongoing concerns of feeling hot.  Her FiO2 did have to be increased overnight due to worsening volume overload from albumin infusions.  Objective: Vitals:   04/08/19 0621 04/08/19 0630 04/08/19 0705 04/08/19 0812  BP:  (!) 127/52 99/63   Pulse:      Resp: (!) 33 (!) 29 (!) 26 (!) 31  Temp:    99.3 F (37.4 C)  TempSrc:    Other (Comment)  SpO2: 96% 90% (!) 87%   Weight:      Height:        Intake/Output Summary (Last 24 hours) at 04/08/2019 1022 Last data filed at 04/08/2019 0504 Gross per 24 hour  Intake 2809.92 ml  Output 1400 ml  Net 1409.92 ml   Filed Weights   04/06/19 0632 04/07/19 0500 04/08/19 0500  Weight: 50.3 kg 55.3 kg 53.6 kg    Examination:  General exam: Appears calm and comfortable  Respiratory system: Clear to auscultation. Respiratory effort normal.  Continues on ventilator now with FiO2  75%. Cardiovascular system: S1 & S2 heard, irregular and tachycardic. No JVD, murmurs, rubs, gallops or clicks. No pedal edema.  Crackles noted bilaterally. Gastrointestinal system: Abdomen is nondistended, soft and nontender. No organomegaly or masses felt. Normal bowel sounds heard. Central nervous system: Alert and awake. Extremities: Symmetric 5 x 5 power. Skin: No rashes, lesions or ulcers Psychiatry: Cannot be assessed.    Data Reviewed: I have personally reviewed following labs and imaging studies  CBC: Recent Labs  Lab 04/04/19 0408 04/05/19 0407 04/06/19 0404 04/07/19 0439 04/08/19 0421  WBC 27.6* 29.7* 26.9* 27.3* 21.8*  HGB 11.9* 11.7* 10.8* 11.9* 11.2*  HCT 38.7 37.2 35.4* 38.5 37.2  MCV 95.8 96.1 96.7 96.0 97.6  PLT 211 185 182 196 258   Basic Metabolic Panel: Recent Labs  Lab 04/01/19 1711  04/04/19 0408 04/05/19 0407 04/06/19 0404 04/07/19 0439 04/08/19 0421  NA  --    < > 149* 151* 151* 146* 146*  K  --    < > 3.3* 3.8 4.0 3.6 3.7  CL  --    < > 105 109 110 104 105  CO2  --    < > '28 31 31 '$ 32 31  GLUCOSE  --    < > 196* 145* 171* 186* 187*  BUN  --    < > 80* 93* 94* 86* 88*  CREATININE  --    < > 1.01* 1.17* 1.17* 0.97 1.02*  CALCIUM  --    < > 9.1 9.1 8.9 8.8* 9.5  MG 2.1  --  2.0 2.1  --   --   --   PHOS 2.9  --   --   --   --   --   --    < > =  values in this interval not displayed.   GFR: Estimated Creatinine Clearance: 32.3 mL/min (A) (by C-G formula based on SCr of 1.02 mg/dL (H)). Liver Function Tests: Recent Labs  Lab 04/04/19 0408 04/06/19 0404 04/07/19 0439 04/08/19 0421  AST 50* 32 31 17  ALT 93* 65* 61* 40  ALKPHOS 76 69 74 69  BILITOT 0.5 0.8 0.9 1.4*  PROT 4.6* 4.6* 4.9* 5.8*  ALBUMIN 1.9* 1.8* 1.9* 3.3*   No results for input(s): LIPASE, AMYLASE in the last 168 hours. No results for input(s): AMMONIA in the last 168 hours. Coagulation Profile: No results for input(s): INR, PROTIME in the last 168 hours. Cardiac  Enzymes: No results for input(s): CKTOTAL, CKMB, CKMBINDEX, TROPONINI in the last 168 hours. BNP (last 3 results) No results for input(s): PROBNP in the last 8760 hours. HbA1C: No results for input(s): HGBA1C in the last 72 hours. CBG: Recent Labs  Lab 04/07/19 1646 04/07/19 1944 04/08/19 0002 04/08/19 0401 04/08/19 0809  GLUCAP 121* 125* 160* 127* 148*   Lipid Profile: No results for input(s): CHOL, HDL, LDLCALC, TRIG, CHOLHDL, LDLDIRECT in the last 72 hours. Thyroid Function Tests: No results for input(s): TSH, T4TOTAL, FREET4, T3FREE, THYROIDAB in the last 72 hours. Anemia Panel: No results for input(s): VITAMINB12, FOLATE, FERRITIN, TIBC, IRON, RETICCTPCT in the last 72 hours. Sepsis Labs: Recent Labs  Lab 04/05/19 0813 04/06/19 0404 04/07/19 0440  PROCALCITON 0.16 0.16 <0.10  LATICACIDVEN  --  1.9  --     No results found for this or any previous visit (from the past 240 hour(s)).       Radiology Studies: Dg Chest Port 1 View  Result Date: 04/08/2019 CLINICAL DATA:  Respiratory failure EXAM: PORTABLE CHEST 1 VIEW COMPARISON:  Chest radiograph from one day prior. FINDINGS: Endotracheal tube tip is 2.0 cm above the carina. Enteric tube enters stomach with the tip not seen on this image. Left PICC terminates over the right atrium. Stable cardiomediastinal silhouette with moderate cardiomegaly. No pneumothorax. Moderate left and small right pleural effusions, stable. Severe fluffy parahilar opacities in both lungs, worsened. Bibasilar atelectasis. IMPRESSION: 1. Well-positioned support structures. 2. Cardiomegaly with worsened severe fluffy bilateral parahilar lung opacities, favor severe cardiogenic pulmonary edema. 3. Moderate left and small right pleural effusions with bibasilar atelectasis, stable. Electronically Signed   By: Ilona Sorrel M.D.   On: 04/08/2019 08:40   Dg Chest Port 1 View  Result Date: 04/07/2019 CLINICAL DATA:  Respiratory failure EXAM: PORTABLE  CHEST 1 VIEW COMPARISON:  Chest radiograph from one day prior. FINDINGS: Enteric tube enters stomach with the tip not seen on this image. Endotracheal tube tip is 2.5 cm above the carina. Left PICC terminates at the cavoatrial junction. Stable cardiomediastinal silhouette with mild cardiomegaly. No pneumothorax. Stable small bilateral pleural effusions, left greater than right. Severe patchy parahilar and bibasilar lung opacities bilaterally, slightly worsened. IMPRESSION: 1. Well-positioned support structures.  No pneumothorax. 2. Stable small bilateral pleural effusions, left greater than right. 3. Stable cardiomegaly. Slight worsening of severe patchy parahilar and bibasilar lung opacities, favor a combination of pulmonary edema and atelectasis. Electronically Signed   By: Ilona Sorrel M.D.   On: 04/07/2019 09:34        Scheduled Meds:  chlorhexidine gluconate (MEDLINE KIT)  15 mL Mouth Rinse BID   Chlorhexidine Gluconate Cloth  6 each Topical Daily   feeding supplement (OSMOLITE 1.5 CAL)  1,000 mL Per Tube Q24H   feeding supplement (PRO-STAT SUGAR FREE 64)  30 mL  Per Tube TID   furosemide  40 mg Intravenous Q12H   insulin aspart  5 Units Subcutaneous Q4H   levalbuterol  0.63 mg Nebulization TID   mouth rinse  15 mL Mouth Rinse 10 times per day   metoprolol tartrate  25 mg Oral Q6H   pantoprazole (PROTONIX) IV  40 mg Intravenous Q24H   predniSONE  40 mg Oral Q breakfast   sodium chloride flush  10-40 mL Intracatheter Q12H   sodium chloride flush  3 mL Intravenous Q12H   Continuous Infusions:  sodium chloride Stopped (04/05/19 0807)   fentaNYL infusion INTRAVENOUS 25 mcg/hr (04/08/19 0309)   heparin 850 Units/hr (04/08/19 0309)     LOS: 14 days    Time spent: 30 minutes    Aleyna Cueva Darleen Crocker, DO [facility] Pager [phone]  If 7PM-7AM, please contact night-coverage [email or web address] Password [facility] 04/08/2019, 10:22 AM

## 2019-04-08 NOTE — Progress Notes (Signed)
No attempt to wean today due to fluid overload. Increased FIO2 throughout the night and PO2 on ABG was decreased.

## 2019-04-08 NOTE — Progress Notes (Signed)
ANTICOAGULATION CONSULT NOTE - Follow Up Consult  Pharmacy Consult for heparin Indication: atrial fibrillation  HEPARIN DW (KG): 47.6   Labs: Recent Labs    04/06/19 0404 04/07/19 0439 04/08/19 0416 04/08/19 0421  HGB 10.8* 11.9*  --  11.2*  HCT 35.4* 38.5  --  37.2  PLT 182 196  --  188  HEPARINUNFRC 0.50 0.46 0.19*  --   CREATININE 1.17* 0.97  --  1.02*    Assessment: 85yo female with history of atrial fibrillation. Heparin is  sub-therapeutic now at 0.19 IU/mL. Patient did receive albumin and Lasix yesterday and has +1 liter of fluid on board this morning.   Goal of Therapy:  Heparin level 0.3-0.7 units/ml  Monitor platelets per protocol   Plan:   Increase heparin infusion to 900 units/hr. Heparin level daily while on heparin. Continue to monitor H&H and s/s of bleeding.  Despina Pole, Pharm. D. Clinical Pharmacist 04/08/2019 8:40 AM

## 2019-04-09 ENCOUNTER — Inpatient Hospital Stay (HOSPITAL_COMMUNITY): Payer: Medicare Other

## 2019-04-09 DIAGNOSIS — Z7189 Other specified counseling: Secondary | ICD-10-CM

## 2019-04-09 DIAGNOSIS — Z515 Encounter for palliative care: Secondary | ICD-10-CM

## 2019-04-09 LAB — BLOOD GAS, ARTERIAL
Acid-Base Excess: 10.6 mmol/L — ABNORMAL HIGH (ref 0.0–2.0)
Bicarbonate: 33 mmol/L — ABNORMAL HIGH (ref 20.0–28.0)
FIO2: 50
O2 Saturation: 81.5 %
Patient temperature: 37.1
pCO2 arterial: 47.8 mmHg (ref 32.0–48.0)
pH, Arterial: 7.475 — ABNORMAL HIGH (ref 7.350–7.450)
pO2, Arterial: 47.2 mmHg — ABNORMAL LOW (ref 83.0–108.0)

## 2019-04-09 LAB — GLUCOSE, CAPILLARY
Glucose-Capillary: 112 mg/dL — ABNORMAL HIGH (ref 70–99)
Glucose-Capillary: 86 mg/dL (ref 70–99)
Glucose-Capillary: 95 mg/dL (ref 70–99)
Glucose-Capillary: 166 mg/dL — ABNORMAL HIGH (ref 70–99)
Glucose-Capillary: 105 mg/dL — ABNORMAL HIGH (ref 70–99)

## 2019-04-09 LAB — MAGNESIUM: Magnesium: 2.6 mg/dL — ABNORMAL HIGH (ref 1.7–2.4)

## 2019-04-09 LAB — CBC
HCT: 32.4 % — ABNORMAL LOW (ref 36.0–46.0)
Hemoglobin: 9.9 g/dL — ABNORMAL LOW (ref 12.0–15.0)
MCH: 30 pg (ref 26.0–34.0)
MCHC: 30.6 g/dL (ref 30.0–36.0)
MCV: 98.2 fL (ref 80.0–100.0)
Platelets: 149 10*3/uL — ABNORMAL LOW (ref 150–400)
RBC: 3.3 MIL/uL — ABNORMAL LOW (ref 3.87–5.11)
RDW: 15.2 % (ref 11.5–15.5)
WBC: 18.1 10*3/uL — ABNORMAL HIGH (ref 4.0–10.5)
nRBC: 0.1 % (ref 0.0–0.2)

## 2019-04-09 LAB — BASIC METABOLIC PANEL
Anion gap: 13 (ref 5–15)
BUN: 105 mg/dL — ABNORMAL HIGH (ref 8–23)
CO2: 30 mmol/L (ref 22–32)
Calcium: 9.2 mg/dL (ref 8.9–10.3)
Chloride: 104 mmol/L (ref 98–111)
Creatinine, Ser: 1.3 mg/dL — ABNORMAL HIGH (ref 0.44–1.00)
GFR calc Af Amer: 42 mL/min — ABNORMAL LOW (ref >60)
GFR calc non Af Amer: 37 mL/min — ABNORMAL LOW (ref >60)
Glucose, Bld: 149 mg/dL — ABNORMAL HIGH (ref 70–99)
Potassium: 3.9 mmol/L (ref 3.5–5.1)
Sodium: 147 mmol/L — ABNORMAL HIGH (ref 135–145)

## 2019-04-09 LAB — HEPARIN LEVEL (UNFRACTIONATED)
Heparin Unfractionated: <0.1 IU/mL — ABNORMAL LOW (ref 0.30–0.70)
Heparin Unfractionated: 0.26 IU/mL — ABNORMAL LOW (ref 0.30–0.70)

## 2019-04-09 MED ORDER — LACTATED RINGERS IV BOLUS: 500.0000 mL | Freq: Once | INTRAVENOUS | Status: DC

## 2019-04-09 MED ORDER — AMIODARONE HCL IN DEXTROSE 360-4.14 MG/200ML-% IV SOLN
30.0000 mg/h | INTRAVENOUS | Status: DC
  Administered 2019-04-09 – 2019-04-10 (×2): 30 mg/h via INTRAVENOUS
  Filled 2019-04-09 (×2): qty 200

## 2019-04-09 MED ORDER — AMIODARONE HCL IN DEXTROSE 360-4.14 MG/200ML-% IV SOLN
60.0000 mg/h | INTRAVENOUS | Status: AC
  Administered 2019-04-09: 60 mg/h via INTRAVENOUS
  Filled 2019-04-09: qty 200

## 2019-04-09 MED ORDER — HEPARIN BOLUS VIA INFUSION
1000.0000 [IU] | Freq: Once | INTRAVENOUS | Status: AC
  Administered 2019-04-09: 1000 [IU] via INTRAVENOUS
  Filled 2019-04-09: qty 1000

## 2019-04-09 NOTE — Progress Notes (Signed)
Subjective: She remains intubated and on the ventilator.  She looks tired.  Objective: Vital signs in last 24 hours: Temp:  [99.7 F (37.6 C)-100 F (37.8 C)] 99.7 F (37.6 C) (06/22 0353) Pulse Rate:  [115-120] 120 (06/22 0631) Resp:  [18-34] 26 (06/22 0630) BP: (84-144)/(52-116) 104/59 (06/22 0630) SpO2:  [82 %-100 %] 96 % (06/22 0637) FiO2 (%):  [55 %-65 %] 55 % (06/22 0440) Weight:  [57.2 kg] 57.2 kg (06/22 0530) Weight change: 3.6 kg Last BM Date: 04/07/19  Intake/Output from previous day: 06/21 0701 - 06/22 0700 In: 971.3 [I.V.:275.1; NG/GT:696.3] Out: 700 [Urine:700]  PHYSICAL EXAM General appearance: alert and Intubated sedated on mechanical ventilation Resp: rhonchi bilaterally Cardio: Still irregular with systolic murmur no gallop GI: soft, non-tender; bowel sounds normal; no masses,  no organomegaly Extremities: Still some third spacing  Lab Results:  Results for orders placed or performed during the hospital encounter of 04/01/2019 (from the past 48 hour(s))  Glucose, capillary     Status: None   Collection Time: 04/07/19 11:34 AM  Result Value Ref Range   Glucose-Capillary 71 70 - 99 mg/dL  Glucose, capillary     Status: Abnormal   Collection Time: 04/07/19  4:46 PM  Result Value Ref Range   Glucose-Capillary 121 (H) 70 - 99 mg/dL  Glucose, capillary     Status: Abnormal   Collection Time: 04/07/19  7:44 PM  Result Value Ref Range   Glucose-Capillary 125 (H) 70 - 99 mg/dL   Comment 1 Notify RN    Comment 2 Document in Chart   Glucose, capillary     Status: Abnormal   Collection Time: 04/08/19 12:02 AM  Result Value Ref Range   Glucose-Capillary 160 (H) 70 - 99 mg/dL  Glucose, capillary     Status: Abnormal   Collection Time: 04/08/19  4:01 AM  Result Value Ref Range   Glucose-Capillary 127 (H) 70 - 99 mg/dL  Heparin level (unfractionated)     Status: Abnormal   Collection Time: 04/08/19  4:16 AM  Result Value Ref Range   Heparin Unfractionated 0.19  (L) 0.30 - 0.70 IU/mL    Comment: (NOTE) If heparin results are below expected values, and patient dosage has  been confirmed, suggest follow up testing of antithrombin III levels. Performed at [facility], [address] [place], [place] [address]   Comprehensive metabolic panel     Status: Abnormal   Collection Time: 04/08/19  4:21 AM  Result Value Ref Range   Sodium 146 (H) 135 - 145 mmol/L   Potassium 3.7 3.5 - 5.1 mmol/L   Chloride 105 98 - 111 mmol/L   CO2 31 22 - 32 mmol/L   Glucose, Bld 187 (H) 70 - 99 mg/dL   BUN 88 (H) 8 - 23 mg/dL   Creatinine, Ser 1.02 (H) 0.44 - 1.00 mg/dL   Calcium 9.5 8.9 - 10.3 mg/dL   Total Protein 5.8 (L) 6.5 - 8.1 g/dL   Albumin 3.3 (L) 3.5 - 5.0 g/dL   AST 17 15 - 41 U/L   ALT 40 0 - 44 U/L   Alkaline Phosphatase 69 38 - 126 U/L   Total Bilirubin 1.4 (H) 0.3 - 1.2 mg/dL   GFR calc non Af Amer 49 (L) >60 mL/min   GFR calc Af Amer 57 (L) >60 mL/min   Anion gap 10 5 - 15    Comment: Performed at [facility], [address] [place], [place] [address]  CBC  Status: Abnormal   Collection Time: 04/08/19  4:21 AM  Result Value Ref Range   WBC 21.8 (H) 4.0 - 10.5 K/uL   RBC 3.81 (L) 3.87 - 5.11 MIL/uL   Hemoglobin 11.2 (L) 12.0 - 15.0 g/dL   HCT 37.2 36.0 - 46.0 %   MCV 97.6 80.0 - 100.0 fL   MCH 29.4 26.0 - 34.0 pg   MCHC 30.1 30.0 - 36.0 g/dL   RDW 15.1 11.5 - 15.5 %   Platelets 188 150 - 400 K/uL   nRBC 0.0 0.0 - 0.2 %    Comment: Performed at [facility], [address] [place], [place] [address]  Blood gas, arterial     Status: Abnormal   Collection Time: 04/08/19  4:50 AM  Result Value Ref Range   FIO2 50.00    pH, Arterial 7.470 (H) 7.350 - 7.450   pCO2 arterial 46.2 32.0 - 48.0 mmHg   pO2, Arterial 48.4 (L) 83.0 - 108.0 mmHg   Bicarbonate 32.1 (H) 20.0 - 28.0 mmol/L   Acid-Base Excess 9.1 (H) 0.0 - 2.0 mmol/L   O2 Saturation 82.6 %   Patient temperature 37.1    Allens test (pass/fail) PASS PASS    Comment: Performed at  [facility], [address], [place], [place] [address]  Glucose, capillary     Status: Abnormal   Collection Time: 04/08/19  8:09 AM  Result Value Ref Range   Glucose-Capillary 148 (H) 70 - 99 mg/dL  Glucose, capillary     Status: Abnormal   Collection Time: 04/08/19 11:19 AM  Result Value Ref Range   Glucose-Capillary 113 (H) 70 - 99 mg/dL  Glucose, capillary     Status: Abnormal   Collection Time: 04/08/19  4:45 PM  Result Value Ref Range   Glucose-Capillary 151 (H) 70 - 99 mg/dL  Glucose, capillary     Status: Abnormal   Collection Time: 04/08/19  8:15 PM  Result Value Ref Range   Glucose-Capillary 158 (H) 70 - 99 mg/dL   Comment 1 Notify RN    Comment 2 Document in Chart   Glucose, capillary     Status: Abnormal   Collection Time: 04/08/19 11:25 PM  Result Value Ref Range   Glucose-Capillary 164 (H) 70 - 99 mg/dL   Comment 1 Notify RN    Comment 2 Document in Chart   Glucose, capillary     Status: Abnormal   Collection Time: 04/09/19  3:52 AM  Result Value Ref Range   Glucose-Capillary 112 (H) 70 - 99 mg/dL   Comment 1 Notify RN    Comment 2 Document in Chart   Heparin level (unfractionated)     Status: Abnormal   Collection Time: 04/09/19  4:12 AM  Result Value Ref Range   Heparin Unfractionated <0.10 (L) 0.30 - 0.70 IU/mL    Comment: (NOTE) If heparin results are below expected values, and patient dosage has  been confirmed, suggest follow up testing of antithrombin III levels. Performed at [facility], [address] [place], [place] [address]   CBC     Status: Abnormal   Collection Time: 04/09/19  4:12 AM  Result Value Ref Range   WBC 18.1 (H) 4.0 - 10.5 K/uL   RBC 3.30 (L) 3.87 - 5.11 MIL/uL   Hemoglobin 9.9 (L) 12.0 - 15.0 g/dL   HCT 32.4 (L) 36.0 - 46.0 %   MCV 98.2 80.0 - 100.0 fL   MCH 30.0 26.0 - 34.0 pg   MCHC  30.6 30.0 - 36.0 g/dL   RDW 15.2 11.5 - 15.5 %   Platelets 149 (L) 150 - 400 K/uL   nRBC 0.1 0.0 - 0.2 %    Comment: Performed at [facility], [address] [place], [place] [address]  Basic metabolic panel     Status: Abnormal   Collection Time: 04/09/19  4:12 AM  Result Value Ref Range   Sodium 147 (H) 135 - 145 mmol/L   Potassium 3.9 3.5 - 5.1 mmol/L   Chloride 104 98 - 111 mmol/L   CO2 30 22 - 32 mmol/L   Glucose, Bld 149 (H) 70 - 99 mg/dL   BUN 105 (H) 8 - 23 mg/dL    Comment: RESULTS CONFIRMED BY MANUAL DILUTION   Creatinine, Ser 1.30 (H) 0.44 - 1.00 mg/dL   Calcium 9.2 8.9 - 10.3 mg/dL   GFR calc non Af Amer 37 (L) >60 mL/min   GFR calc Af Amer 42 (L) >60 mL/min   Anion gap 13 5 - 15    Comment: Performed at [facility], [address], [place], [place] [address]  Magnesium     Status: Abnormal   Collection Time: 04/09/19  4:12 AM  Result Value Ref Range   Magnesium 2.6 (H) 1.7 - 2.4 mg/dL    Comment: Performed at [facility], [address] [place], [place] [address]  Blood gas, arterial     Status: Abnormal   Collection Time: 04/09/19  4:50 AM  Result Value Ref Range   FIO2 50.00    pH, Arterial 7.475 (H) 7.350 - 7.450   pCO2 arterial 47.8 32.0 - 48.0 mmHg   pO2, Arterial 47.2 (L) 83.0 - 108.0 mmHg   Bicarbonate 33.0 (H) 20.0 - 28.0 mmol/L   Acid-Base Excess 10.6 (H) 0.0 - 2.0 mmol/L   O2 Saturation 81.5 %   Patient temperature 37.1    Allens test (pass/fail) PASS PASS    Comment: Performed at [facility], [address], [place], [place] [address]  Glucose, capillary     Status: None   Collection Time: 04/09/19  7:52 AM  Result Value Ref Range   Glucose-Capillary 86 70 - 99 mg/dL    ABGS Recent Labs    04/09/19 0450  PHART 7.475*  PO2ART 47.2*  HCO3 33.0*   CULTURES No results found for this or any previous visit (from the past 240 hour(s)). Studies/Results: Dg Chest Port 1 View  Result Date: 04/09/2019 CLINICAL DATA:  Hypoxia.  Respiratory failure. EXAM: PORTABLE CHEST 1 VIEW COMPARISON:  04/08/2019. FINDINGS: Endotracheal tube, NG tube, left PICC line stable position. Heart size  stable. Unchanged diffuse dense bilateral pulmonary infiltrates/edema. Bilateral pleural effusions again noted. No interim change. No pneumothorax. IMPRESSION: 1.  Lines and tubes in stable position. 2. Unchanged diffuse dense bilateral pulmonary infiltrates/edema again noted. Bilateral pleural effusions again noted. No interim change. Electronically Signed   By: Marcello Moores  Register   On: 04/09/2019 07:14   Dg Chest Port 1 View  Result Date: 04/08/2019 CLINICAL DATA:  Respiratory failure EXAM: PORTABLE CHEST 1 VIEW COMPARISON:  Chest radiograph from one day prior. FINDINGS: Endotracheal tube tip is 2.0 cm above the carina. Enteric tube enters stomach with the tip not seen on this image. Left PICC terminates over the right atrium. Stable cardiomediastinal silhouette with moderate cardiomegaly. No pneumothorax. Moderate left and small right pleural effusions, stable. Severe fluffy parahilar opacities in both lungs, worsened. Bibasilar atelectasis. IMPRESSION: 1. Well-positioned support structures. 2. Cardiomegaly with worsened  severe fluffy bilateral parahilar lung opacities, favor severe cardiogenic pulmonary edema. 3. Moderate left and small right pleural effusions with bibasilar atelectasis, stable. Electronically Signed   By: Ilona Sorrel M.D.   On: 04/08/2019 08:40    Medications:  Prior to Admission:  Medications Prior to Admission  Medication Sig Dispense Refill Last Dose  . apixaban (ELIQUIS) 2.5 MG TABS tablet Take 2.5 mg by mouth 2 (two) times daily.    03/24/2019 at Unknown time  . aspirin 81 MG tablet Take 81 mg by mouth daily.   unknown  . Calcium Carbonate-Vitamin D (CALCIUM + D PO) Take 1 tablet by mouth daily.    unknown  . digoxin (LANOXIN) 0.125 MG tablet Take 0.125 mg by mouth daily.   03/24/2019 at Unknown time  . furosemide (LASIX) 20 MG tablet Take 20 mg by mouth See admin instructions. Take one tablet by mouth daily 5 days per week   03/23/2019 at Unknown time  . losartan (COZAAR) 50 MG  tablet Take 50 mg by mouth daily.    03/24/2019 at Unknown time  . metoprolol tartrate (LOPRESSOR) 100 MG tablet Take 100 mg by mouth 2 (two) times daily.    03/24/2019 at Unknown time  . Multiple Vitamin (MULTIVITAMIN) tablet Take 1 tablet by mouth daily.   03/24/2019  . ondansetron (ZOFRAN) 4 MG tablet Take 4 mg by mouth daily as needed for nausea or vomiting.   03/24/2019 at Unknown time  . potassium chloride (K-DUR) 10 MEQ tablet Take 10 mEq by mouth See admin instructions. Take one tablet daily by mouth 5 days per week   03/23/2019 at Unknown time  . Cholecalciferol (VITAMIN D PO) Take 1 capsule by mouth daily.    03/24/2019   Scheduled: . chlorhexidine gluconate (MEDLINE KIT)  15 mL Mouth Rinse BID  . Chlorhexidine Gluconate Cloth  6 each Topical Daily  . feeding supplement (OSMOLITE 1.5 CAL)  1,000 mL Per Tube Q24H  . feeding supplement (PRO-STAT SUGAR FREE 64)  30 mL Per Tube TID  . heparin  1,000 Units Intravenous Once  . insulin aspart  5 Units Subcutaneous Q4H  . levalbuterol  0.63 mg Nebulization TID  . mouth rinse  15 mL Mouth Rinse 10 times per day  . metoprolol tartrate  25 mg Oral Q6H  . pantoprazole (PROTONIX) IV  40 mg Intravenous Q24H  . predniSONE  40 mg Oral Q breakfast  . sodium chloride flush  10-40 mL Intracatheter Q12H  . sodium chloride flush  3 mL Intravenous Q12H   Continuous: . sodium chloride Stopped (04/05/19 0807)  . fentaNYL infusion INTRAVENOUS 25 mcg/hr (04/09/19 0300)  . heparin 900 Units/hr (04/09/19 0300)   ONG:EXBMWU chloride, acetaminophen **OR** acetaminophen, albuterol, bisacodyl, fentaNYL, metoprolol tartrate, midazolam, midazolam, ondansetron **OR** ondansetron (ZOFRAN) IV, phenol, polyethylene glycol, sodium chloride flush, sodium chloride flush, traZODone  Assesment: She was admitted with small bowel obstruction.  This was improving when she developed acute respiratory failure with ARDS.  This culminated in her being intubated and placed on mechanical  ventilation and she is been on the ventilator since.  She also had acute diastolic heart failure which has been treated.  She appeared to be volume overloaded yesterday and chest x-ray is basically unchanged today.  She did not respond well to Lasix yesterday.  She has distal cyanosis of her hands and feet.  She looks tired. Principal Problem:   SBO (small bowel obstruction) (HCC) Active Problems:   Essential hypertension   Chronic a-fib  PNA (pneumonia)--Aspiration   Respiratory failure (HCC)   Acute diastolic CHF (congestive heart failure) (HCC)   Atrial fibrillation with RVR (HCC)   Elevated troponin   Lobar pneumonia (HCC)   Coronary artery disease due to lipid rich plaque   Hypotension   Ventilator dependence ([place])   Goals of care, counseling/discussion   Palliative care encounter    Plan: Long discussion with her son Tressie Ellis.  He understands the gravity of the situation.  He is leaning toward more of a emphasis on comfort    LOS: 15 days   Alonza Bogus 04/09/2019, 8:32 AM

## 2019-04-09 NOTE — Progress Notes (Signed)
Discussion in my office with Mr. Carrie Morales and his wife. They are considering transition to comfort care and agree to DNR status

## 2019-04-09 NOTE — Progress Notes (Signed)
Daily Progress Note   Patient Name: Carrie Morales       Date: 04/09/2019 DOB: 08-May-1928  Age: 85 y.o. MRN#: [id number] Attending Physician: Rodena Goldmann, DO Primary Care Physician: Earney Mallet, MD Admit Date: 04/03/2019  Reason for Consultation/Follow-up: Establishing goals of care  Subjective: Patient is not improving. She has severe hypoalbuminemia, with diffuse anasarca. Not weaning. Extremities are mottled, feet and hands purple. BP is dropping. Urine output low. She is awake, but very lethargic, has not been able to wean from vent today. Nods to questions, can wave hand but not lift extremities, denies pain, complains of being hot. Called patient's son to discuss- however, he was in transit to discuss with Dr. Luan Pulling- he requested to speak with PMT after his discussion with Dr. Luan Pulling and Dr. Manuella Ghazi.  Visited with son and his wife at bedside. Per RN plan made for DNR and one way extubation tomorrow. Introduced myself and gave emotional support.  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 They were well known in the community. She has several grandchildren. Family is very aggrieved, but expressed great amount of appreciation for all care she has received during this admission.    Review of Systems  Unable to perform ROS: Intubated    Length of Stay: 15  Current Medications: Scheduled Meds:  . chlorhexidine gluconate (MEDLINE KIT)  15 mL Mouth Rinse BID  . Chlorhexidine Gluconate Cloth  6 each Topical Daily  . feeding supplement (OSMOLITE 1.5 CAL)  1,000 mL Per Tube Q24H  . feeding supplement (PRO-STAT SUGAR FREE 64)  30 mL Per Tube TID  . levalbuterol  0.63 mg Nebulization TID  . mouth rinse  15 mL Mouth  Rinse 10 times per day  . pantoprazole (PROTONIX) IV  40 mg Intravenous Q24H  . predniSONE  40 mg Oral Q breakfast  . sodium chloride flush  10-40 mL Intracatheter Q12H  . sodium chloride flush  3 mL Intravenous Q12H    Continuous Infusions: . sodium chloride Stopped (04/05/19 0807)  . amiodarone 60 mg/hr (04/09/19 1216)  . amiodarone    . fentaNYL infusion INTRAVENOUS 25 mcg/hr (04/09/19 0300)  . heparin 1,050 Units/hr (04/09/19 1003)    PRN Meds: sodium chloride, acetaminophen **OR** acetaminophen, albuterol, bisacodyl, fentaNYL, metoprolol tartrate,  midazolam, midazolam, ondansetron **OR** ondansetron (ZOFRAN) IV, phenol, polyethylene glycol, sodium chloride flush, sodium chloride flush, traZODone  Physical Exam Vitals signs and nursing note reviewed.  Constitutional:      Appearance: She is ill-appearing.     Comments: Diffuse anasarca  Cardiovascular:     Rate and Rhythm: Tachycardia present.     Comments: Peripheral pulses weak Pulmonary:     Comments: intubated Skin:    Coloration: Skin is pale.     Comments: Fingertips purple  Neurological:     Comments: Awake, lethargic, nods in response to questions  Psychiatric:     Comments: Calm             Vital Signs: BP (!) 92/56   Pulse (!) 120   Temp 100 F (37.8 C) (Core)   Resp (!) 32   Ht _0  (1.626 m)   Wt 57.2 kg   SpO2 99%   BMI 21.65 kg/m  SpO2: SpO2: 99 % O2 Device: O2 Device: Ventilator O2 Flow Rate: O2 Flow Rate (L/min): 50 L/min  Intake/output summary:   Intake/Output Summary (Last 24 hours) at 04/09/2019 1630 Last data filed at 04/09/2019 1200 Gross per 24 hour  Intake 1519.96 ml  Output 700 ml  Net 819.96 ml   LBM: Last BM Date: 04/07/19 Baseline Weight: Weight: 52.2 kg Most recent weight: Weight: 57.2 kg       Palliative Assessment/Data: PPS: 10%     Patient Active Problem List   Diagnosis Date Noted  . Hypotension 04/05/2019  . Ventilator dependence ([place])   . Goals of care,  counseling/discussion   . Palliative care encounter   . Coronary artery disease due to lipid rich plaque   . Lobar pneumonia ([place]) 03/27/2019  . Elevated troponin   . Respiratory failure ([place]) 03/26/2019  . Acute diastolic CHF (congestive heart failure) ([place]) 03/26/2019  . Atrial fibrillation with RVR ([place])   . SBO (small bowel obstruction) ([place]) 04/06/2019  . Essential hypertension 03/21/2019  . Chronic a-fib 04/11/2019  . PNA (pneumonia)--Aspiration 04/04/2019    Palliative Care Assessment & Plan   Patient Profile: 85 y.o. female  with past medical history of atrial fibrillation, HTN, colonic diverticular abscess, osteopenia admitted on 03/31/2019 with constipation and vomiting and found to have high grade SBO along with likely aspiration pneumonia with sepsis. She ultimately required intubation and has not made much progress to wean. She has struggled with hypotension limiting control of atrial fibrillation and diuresis.   Assessment/Recommendations/Plan   Per RN report plan is for DNR and one way extubation tomorrow  PMT will be available to assist as needed, son- Carrie Morales has been primarily communicating with Dr. Luan Pulling and Dr. Manuella Ghazi regarding [name]- however, was receptive to introduction and receiving support this afternoon  Prognosis:   Hours - Days  Discharge Planning:  Anticipated Hospital Death  Care plan was discussed with Dr. Manuella Ghazi and patient's RN.  Thank you for allowing the Palliative Medicine Team to assist in the care of this patient.   Time In: 1545 Time Out: 1630 Total Time 45 minutes Prolonged Time Billed no      Greater than 50%  of this time was spent counseling and coordinating care related to the above assessment and plan.  Mariana Kaufman, AGNP-C Palliative Medicine   Please contact Palliative Medicine Team phone at [phone] for questions and concerns.

## 2019-04-09 NOTE — Care Management Important Message (Signed)
Important Message  Patient Details  Name: Carrie Morales MRN: [id number] Date of Birth: 1928-09-29   Medicare Important Message Given:  Yes(copy was placed at bedside)     Tommy Medal 04/09/2019, 3:07 PM

## 2019-04-09 NOTE — Progress Notes (Addendum)
ANTICOAGULATION CONSULT NOTE - Follow Up Consult  Pharmacy Consult for heparin Indication: atrial fibrillation  HEPARIN DW (KG): 47.6   Labs: Recent Labs    04/07/19 0439 04/08/19 0416 04/08/19 0421 04/09/19 0412  HGB 11.9*  --  11.2* 9.9*  HCT 38.5  --  37.2 32.4*  PLT 196  --  188 149*  HEPARINUNFRC 0.46 0.19*  --  <0.10*  CREATININE 0.97  --  1.02* 1.30*    Assessment: 85yo female with history of atrial fibrillation. Heparin is  sub-therapeutic now at <0.10. Confirmed with RN that heparin has been running without interruptions.  Patient's heparin level has been inconsistent throughout admission.  Will be cautious with dose increase as patient has gone supratherapeutic once doses start going over 1000 units/hr   Goal of Therapy:  Heparin level 0.3-0.7 units/ml  Monitor platelets per protocol   Plan:  Rebolus heparin 1000 units x 1 Increase heparin infusion to 1050 units/hr. Heparin level in 8 hours and daily while on heparin. Continue to monitor H&H and s/s of bleeding.  Margot Ables, PharmD Clinical Pharmacist 04/09/2019 8:11 AM

## 2019-04-09 NOTE — Progress Notes (Signed)
PROGRESS NOTE    Carrie Morales  OVF:[id number] DOB: 1928-03-11 DOA: 03/24/2019 PCP: Earney Mallet, MD   Brief Narrative:  Per HPI: 85 year old female with a history of chronic atrial fibrillation on apixaban, diastolic CHF, hypertension, bowel obstruction status post bowel resection 30 years ago presenting with 1 week history of abdominal pain that worsened over the past 2 to 3 days to the point of resulting in nausea and vomiting. The patient was also having constipation-like symptoms and tried some over-the-counter laxatives without relief. Because of her progressive symptoms she presented for further evaluation. CT of the abdomen and pelvis in the ED showed left basilar collapse less consolidative changes, small bilateral pleural effusions, left greater than right with a large hiatus hernia. There was also dilatation of the proximal and mid small bowel loops up to 2.9 cm consistent with a high-grade SBO. The patient was treated with bowel rest and IV fluids. When she arrived to the medical floor, the patient developed atrial fibrillation with RVR and respiratory distress. She was transferred to the stepdown unit and started on diltiazem drip and placed on BiPAP. Subsequently, the patient became hypotensive requiring fluid resuscitation. Repeat chest x-ray show worsening bilateral perihilar densities with stable bibasilar opacifications. Lactic acid peaked at 4.4. The patient was started on ceftriaxone and metronidazole.  She has now completed course of treatment with Merrem and continues to remain on the ventilator due to hypoxemic and hypercapnic respiratory failure. She has been weaned off amiodarone on 6/17 and remains in atrial fibrillation, but is rate controlled and on heparin drip.She will be given some albumin as well as further diuresis on 6/20. Steroids have also been weaned on 6/20. She continues to do some ventilator weaning daily.  She has received some albumin on  6/20 and is now volume overloaded and requiring high amounts of oxygen on the ventilator.  Increase diuresis was attempted on 6/21 and appears to have contributed further to some AKI with no significant urine output.  Her FiO2 on ventilator has been decreased to 50% from 75% on 6/22.  She continues to have some ongoing tachycardia with soft blood pressure readings and does not appear to be appropriate for further IV Lasix today.   Assessment & Plan:   Principal Problem:   SBO (small bowel obstruction) (HCC) Active Problems:   Essential hypertension   Chronic a-fib   PNA (pneumonia)--Aspiration   Respiratory failure (HCC)   Acute diastolic CHF (congestive heart failure) (HCC)   Atrial fibrillation with RVR (HCC)   Elevated troponin   Lobar pneumonia (HCC)   Coronary artery disease due to lipid rich plaque   Hypotension   Ventilator dependence (HCC)   Goals of care, counseling/discussion   Palliative care encounter   Sepsis secondary to pneumonia-resolved -Merrem has completed 6/17,leukocytosis now downtrending -Procalcitonin low -Blood cultures with no growth and urine analysis negative  Acute hypoxemic and hypercapnic respiratory failure secondarytoabove-worsening hypoxemia with pulmonary edema -Plan for aggressive diuresis starting today and hold further fluid intake as well as albumin today. -Appreciate pulmonology assistance and recommendations with FiO250%% today. -Solumedrol changed to prednisone to Pulmonology -Chest x-ray demonstratingongoing pulmonary vascular congestion and ABG stable; repeat pending -Attempting ventilator weaningdaily -Long discussion with son today as we approach tracheostomy.  Leaning more towards comfort care soon hopefully.  Appreciate palliative discussions.  Small bowel obstruction-resolved -SBO has resolved and appreciate general surgery recommendations -Continue electrolyte supplementation as tolerated -Continuecurrenttube feeds   Atrial fibrillation with RVR-ongoing -Appreciate cardiology recommendations; may need to take off  metoprolol and initiate amiodarone for better control in the face of soft blood pressure readings -Continue heparin drip  Acute on chronic diastolic CHF -Continue to monitor I's and O's as well as daily weights -Lasix restarted today with albumin by Pulmonology -Chest x-ray continues to demonstrate pulmonary vascular congestionwith repeat performed today 6/19  AKI on CKD stage III (baseline creatinine 0.8-1.1) -Currently elevated creatinine after Lasix, but also with soft blood pressure readings noted. -Hold further Lasix for now until blood pressure readings have stabilized -Repeat labs in a.m.  Pericardial effusion with MR -Repeat echocardiogram per cardiologyperformed 6/18 with no significant worsening effusion -No tamponade physiology noted  Mild hyperglycemia-likely steroid-induced -A1c 5.3% -Continue close monitoring  Mild diarrhea-resolved -Continue to monitor, but likely was due to tube feeding change  Hypoalbuminemia-improved -Discontinue further infusions due to volume overload today and monitor   DVT prophylaxis:IV heparin Code Status:Full Family Communication:Updated son, Nicole Kindred, at bedside on 6/22 Disposition Plan:Continue current management and wean ventilator as tolerated.  May need to resume amiodarone today for better heart rate control in the face of soft blood pressure readings.  Appears to be getting close to needing tracheostomy, but family may elect for comfort care.   Consultants:  General surgery  Cardiology  Pulmonology  Palliative care  Procedures:  None  Antimicrobials: Ceftriaxone/flagyl 6/7>>6/8 Merrem 6/8>>>6/17  Subjective: Patient seen and evaluated today with no new acute complaints or concerns. No acute concerns or events noted overnight.  She continues to remain warm.  Her FiO2 requirements have decreased  on the ventilator, however patient appears more somnolent and fatigued.  Objective: Vitals:   04/09/19 0630 04/09/19 0631 04/09/19 0637 04/09/19 0836  BP: (!) 104/59     Pulse:  (!) 120    Resp: (!) 26     Temp:    100.2 F (37.9 C)  TempSrc:    Core  SpO2:   96% 93%  Weight:      Height:        Intake/Output Summary (Last 24 hours) at 04/09/2019 1103 Last data filed at 04/09/2019 0615 Gross per 24 hour  Intake 971.33 ml  Output 700 ml  Net 271.33 ml   Filed Weights   04/07/19 0500 04/08/19 0500 04/09/19 0530  Weight: 55.3 kg 53.6 kg 57.2 kg    Examination:  General exam: Appears fatigued and tired Respiratory system: Crackles bilaterally.  Intubated on FiO2 50%. Cardiovascular system: S1 & S2 heard, irregular and tachycardic. No JVD, murmurs, rubs, gallops or clicks. No pedal edema. Gastrointestinal system: Abdomen is nondistended, soft and nontender. No organomegaly or masses felt. Normal bowel sounds heard. Central nervous system: Alert arousable, but more somnolent Extremities: Symmetric 5 x 5 power. Skin: No rashes, lesions or ulcers Psychiatry: Cannot be accurately assessed.    Data Reviewed: I have personally reviewed following labs and imaging studies  CBC: Recent Labs  Lab 04/05/19 0407 04/06/19 0404 04/07/19 0439 04/08/19 0421 04/09/19 0412  WBC 29.7* 26.9* 27.3* 21.8* 18.1*  HGB 11.7* 10.8* 11.9* 11.2* 9.9*  HCT 37.2 35.4* 38.5 37.2 32.4*  MCV 96.1 96.7 96.0 97.6 98.2  PLT 185 182 196 188 496*   Basic Metabolic Panel: Recent Labs  Lab 04/04/19 0408 04/05/19 0407 04/06/19 0404 04/07/19 0439 04/08/19 0421 04/09/19 0412  NA 149* 151* 151* 146* 146* 147*  K 3.3* 3.8 4.0 3.6 3.7 3.9  CL 105 109 110 104 105 104  CO2 _0 32 31 30  GLUCOSE 196* 145* 171* 186* 187* 149*  BUN 80* 93* 94* 86* 88* 105*  CREATININE 1.01* 1.17* 1.17* 0.97 1.02* 1.30*  CALCIUM 9.1 9.1 8.9 8.8* 9.5 9.2  MG 2.0 2.1  --   --   --  2.6*   GFR: Estimated  Creatinine Clearance: 25.8 mL/min (A) (by C-G formula based on SCr of 1.3 mg/dL (H)). Liver Function Tests: Recent Labs  Lab 04/04/19 0408 04/06/19 0404 04/07/19 0439 04/08/19 0421  AST 50* 32 31 17  ALT 93* 65* 61* 40  ALKPHOS 76 69 74 69  BILITOT 0.5 0.8 0.9 1.4*  PROT 4.6* 4.6* 4.9* 5.8*  ALBUMIN 1.9* 1.8* 1.9* 3.3*   No results for input(s): LIPASE, AMYLASE in the last 168 hours. No results for input(s): AMMONIA in the last 168 hours. Coagulation Profile: No results for input(s): INR, PROTIME in the last 168 hours. Cardiac Enzymes: No results for input(s): CKTOTAL, CKMB, CKMBINDEX, TROPONINI in the last 168 hours. BNP (last 3 results) No results for input(s): PROBNP in the last 8760 hours. HbA1C: No results for input(s): HGBA1C in the last 72 hours. CBG: Recent Labs  Lab 04/08/19 1645 04/08/19 2015 04/08/19 2325 04/09/19 0352 04/09/19 0752  GLUCAP 151* 158* 164* 112* 86   Lipid Profile: No results for input(s): CHOL, HDL, LDLCALC, TRIG, CHOLHDL, LDLDIRECT in the last 72 hours. Thyroid Function Tests: No results for input(s): TSH, T4TOTAL, FREET4, T3FREE, THYROIDAB in the last 72 hours. Anemia Panel: No results for input(s): VITAMINB12, FOLATE, FERRITIN, TIBC, IRON, RETICCTPCT in the last 72 hours. Sepsis Labs: Recent Labs  Lab 04/05/19 0813 04/06/19 0404 04/07/19 0440  PROCALCITON 0.16 0.16 <0.10  LATICACIDVEN  --  1.9  --     No results found for this or any previous visit (from the past 240 hour(s)).       Radiology Studies: Dg Chest Port 1 View  Result Date: 04/09/2019 CLINICAL DATA:  Hypoxia.  Respiratory failure. EXAM: PORTABLE CHEST 1 VIEW COMPARISON:  04/08/2019. FINDINGS: Endotracheal tube, NG tube, left PICC line stable position. Heart size stable. Unchanged diffuse dense bilateral pulmonary infiltrates/edema. Bilateral pleural effusions again noted. No interim change. No pneumothorax. IMPRESSION: 1.  Lines and tubes in stable position. 2.  Unchanged diffuse dense bilateral pulmonary infiltrates/edema again noted. Bilateral pleural effusions again noted. No interim change. Electronically Signed   By: Marcello Moores  Register   On: 04/09/2019 07:14   Dg Chest Port 1 View  Result Date: 04/08/2019 CLINICAL DATA:  Respiratory failure EXAM: PORTABLE CHEST 1 VIEW COMPARISON:  Chest radiograph from one day prior. FINDINGS: Endotracheal tube tip is 2.0 cm above the carina. Enteric tube enters stomach with the tip not seen on this image. Left PICC terminates over the right atrium. Stable cardiomediastinal silhouette with moderate cardiomegaly. No pneumothorax. Moderate left and small right pleural effusions, stable. Severe fluffy parahilar opacities in both lungs, worsened. Bibasilar atelectasis. IMPRESSION: 1. Well-positioned support structures. 2. Cardiomegaly with worsened severe fluffy bilateral parahilar lung opacities, favor severe cardiogenic pulmonary edema. 3. Moderate left and small right pleural effusions with bibasilar atelectasis, stable. Electronically Signed   By: Ilona Sorrel M.D.   On: 04/08/2019 08:40        Scheduled Meds: . chlorhexidine gluconate (MEDLINE KIT)  15 mL Mouth Rinse BID  . Chlorhexidine Gluconate Cloth  6 each Topical Daily  . feeding supplement (OSMOLITE 1.5 CAL)  1,000 mL Per Tube Q24H  . feeding supplement (PRO-STAT SUGAR FREE 64)  30 mL Per Tube TID  . insulin aspart  5 Units Subcutaneous Q4H  .  levalbuterol  0.63 mg Nebulization TID  . mouth rinse  15 mL Mouth Rinse 10 times per day  . metoprolol tartrate  25 mg Oral Q6H  . pantoprazole (PROTONIX) IV  40 mg Intravenous Q24H  . predniSONE  40 mg Oral Q breakfast  . sodium chloride flush  10-40 mL Intracatheter Q12H  . sodium chloride flush  3 mL Intravenous Q12H   Continuous Infusions: . sodium chloride Stopped (04/05/19 0807)  . fentaNYL infusion INTRAVENOUS 25 mcg/hr (04/09/19 0300)  . heparin 1,050 Units/hr (04/09/19 1003)     LOS: 15 days     Time spent: 30 minutes    Aniyiah Zell Darleen Crocker, DO [facility] Pager [phone]  If 7PM-7AM, please contact night-coverage [email or web address] Password [facility] 04/09/2019, 11:03 AM

## 2019-04-09 NOTE — Progress Notes (Signed)
ANTICOAGULATION CONSULT NOTE - Follow Up Consult  Pharmacy Consult for heparin Indication: atrial fibrillation  HEPARIN DW (KG): 47.6   Labs: Recent Labs    04/07/19 0439 04/08/19 0416 04/08/19 0421 04/09/19 0412 04/09/19 1540  HGB 11.9*  --  11.2* 9.9*  --   HCT 38.5  --  37.2 32.4*  --   PLT 196  --  188 149*  --   HEPARINUNFRC 0.46 0.19*  --  <0.10* 0.26*  CREATININE 0.97  --  1.02* 1.30*  --     Assessment: 85yo female with history of atrial fibrillation. Confirmed with RN that heparin has been running without interruptions.  Patient's heparin level has been inconsistent throughout admission.  Will be cautious with dose increase as patient has gone supratherapeutic once doses start going over 1000 units/hr.  Patient's family is considering transition to comfort care.  HL 0.26   Goal of Therapy:  Heparin level 0.3-0.7 units/ml  Monitor platelets per protocol   Plan:  Increase heparin infusion to 1150 units/hr. Heparin level in 8 hours and daily while on heparin. Continue to monitor H&H and s/s of bleeding.   Margot Ables, PharmD Clinical Pharmacist 04/09/2019 5:14 PM

## 2019-04-10 DIAGNOSIS — Z515 Encounter for palliative care: Secondary | ICD-10-CM

## 2019-04-10 LAB — CBC
HCT: 37.5 % (ref 36.0–46.0)
Hemoglobin: 11.3 g/dL — ABNORMAL LOW (ref 12.0–15.0)
MCH: 29.3 pg (ref 26.0–34.0)
MCHC: 30.1 g/dL (ref 30.0–36.0)
MCV: 97.2 fL (ref 80.0–100.0)
Platelets: 214 10*3/uL (ref 150–400)
RBC: 3.86 MIL/uL — ABNORMAL LOW (ref 3.87–5.11)
RDW: 15.6 % — ABNORMAL HIGH (ref 11.5–15.5)
WBC: 21.6 10*3/uL — ABNORMAL HIGH (ref 4.0–10.5)
nRBC: 0 % (ref 0.0–0.2)

## 2019-04-10 LAB — HEPARIN LEVEL (UNFRACTIONATED)
Heparin Unfractionated: 0.17 IU/mL — ABNORMAL LOW (ref 0.30–0.70)
Heparin Unfractionated: 0.35 IU/mL (ref 0.30–0.70)

## 2019-04-10 LAB — BASIC METABOLIC PANEL
Anion gap: 11 (ref 5–15)
BUN: 120 mg/dL — ABNORMAL HIGH (ref 8–23)
CO2: 32 mmol/L (ref 22–32)
Calcium: 9.5 mg/dL (ref 8.9–10.3)
Chloride: 105 mmol/L (ref 98–111)
Creatinine, Ser: 1.54 mg/dL — ABNORMAL HIGH (ref 0.44–1.00)
GFR calc Af Amer: 35 mL/min — ABNORMAL LOW (ref >60)
GFR calc non Af Amer: 30 mL/min — ABNORMAL LOW (ref >60)
Glucose, Bld: 180 mg/dL — ABNORMAL HIGH (ref 70–99)
Potassium: 4 mmol/L (ref 3.5–5.1)
Sodium: 148 mmol/L — ABNORMAL HIGH (ref 135–145)

## 2019-04-10 LAB — MAGNESIUM: Magnesium: 2.7 mg/dL — ABNORMAL HIGH (ref 1.7–2.4)

## 2019-04-10 LAB — GLUCOSE, CAPILLARY
Glucose-Capillary: 135 mg/dL — ABNORMAL HIGH (ref 70–99)
Glucose-Capillary: 152 mg/dL — ABNORMAL HIGH (ref 70–99)

## 2019-04-10 MED ORDER — MORPHINE 100MG IN NS 100ML (1MG/ML) PREMIX INFUSION
1.0000 mg/h | INTRAVENOUS | Status: DC
  Administered 2019-04-10: 2 mg/h via INTRAVENOUS
  Filled 2019-04-10: qty 100

## 2019-04-10 MED ORDER — DIPHENHYDRAMINE HCL 50 MG/ML IJ SOLN: 25.0000 mg | INTRAMUSCULAR | Status: DC | PRN

## 2019-04-10 MED ORDER — MORPHINE BOLUS VIA INFUSION
2.0000 mg | INTRAVENOUS | Status: DC | PRN
  Filled 2019-04-10: qty 2

## 2019-04-10 MED ORDER — LORAZEPAM 2 MG/ML IJ SOLN: 2.0000 mg | INTRAMUSCULAR | Status: DC | PRN

## 2019-04-10 MED ORDER — GLYCOPYRROLATE 0.2 MG/ML IJ SOLN: 0.2000 mg | INTRAMUSCULAR | Status: DC | PRN

## 2019-04-10 MED ORDER — POLYVINYL ALCOHOL 1.4 % OP SOLN: 1.0000 [drp] | Freq: Four times a day (QID) | OPHTHALMIC | Status: DC | PRN

## 2019-04-10 MED ORDER — GLYCOPYRROLATE 1 MG PO TABS: 1.0000 mg | ORAL_TABLET | ORAL | Status: DC | PRN

## 2019-04-10 MED ORDER — HALOPERIDOL LACTATE 5 MG/ML IJ SOLN: 2.5000 mg | INTRAMUSCULAR | Status: DC | PRN

## 2019-04-18 NOTE — Discharge Summary (Signed)
Physician Discharge Summary  Carrie AltesKatherine Morales ZOX:[id number] DOB: 12/26/1927 DOA: 04/05/2019  PCP: Alinda DeemJannach, Stephen, MD  Admit date: 03/23/2019  Discharge date: 03/19/2019  Admitted From:Home  Disposition:  Expired on 04/08/2019 at 1724   Brief/Interim Summary: Per HPI: 85 year old female with a history of chronic atrial fibrillation on apixaban, diastolic CHF, hypertension, bowel obstruction status post bowel resection 30 years ago presenting with 1 week history of abdominal pain that worsened over the past 2 to 3 days to the point of resulting in nausea and vomiting. The patient was also having constipation-like symptoms and tried some over-the-counter laxatives without relief. Because of her progressive symptoms she presented for further evaluation. CT of the abdomen and pelvis in the ED showed left basilar collapse less consolidative changes, small bilateral pleural effusions, left greater than right with a large hiatus hernia. There was also dilatation of the proximal and mid small bowel loops up to 2.9 cm consistent with a high-grade SBO. The patient was treated with bowel rest and IV fluids. When she arrived to the medical floor, the patient developed atrial fibrillation with RVR and respiratory distress. She was transferred to the stepdown unit and started on diltiazem drip and placed on BiPAP. Subsequently, the patient became hypotensive requiring fluid resuscitation. Repeat chest x-ray show worsening bilateral perihilar densities with stable bibasilar opacifications. Lactic acid peaked at 4.4. The patient was started on ceftriaxone and metronidazole.  She has now completed course of treatment with Merrem and continues to remain on the ventilator due to hypoxemic and hypercapnic respiratory failure. She has been weaned off amiodarone on 6/17 and remains in atrial fibrillation, but is rate controlled and on heparin drip.She will be given some albumin as well as further diuresis  on 6/20. Steroids have also been weaned on 6/20. She continues to do some ventilator weaning daily.She has received some albumin on 6/20 and is now volume overloaded and requiring high amounts of oxygen on the ventilator.Increase diuresis was attempted on 6/21 and appears to have contributed further to some AKI with no significant urine output. Her FiO2 on ventilator has been decreased to 50% from 75% on 6/22. She continues to have some ongoing tachycardia with soft blood pressure readings and does not appear to be appropriate for further IV Lasix.  Family members did not want to pursue further aggressive measures with tracheostomy in the next few days and it appeared that overall condition was not improving.  Family had agreed to a one-way extubation on 6/23 and this was performed at 1703. The patient was placed on morphine for comfort and subsequently expired at 1724.  Discharge Diagnoses:  Principal Problem:   SBO (small bowel obstruction) (HCC) Active Problems:   Essential hypertension   Chronic a-fib   PNA (pneumonia)--Aspiration   Respiratory failure (HCC)   Acute diastolic CHF (congestive heart failure) (HCC)   Atrial fibrillation with RVR (HCC)   Elevated troponin   Lobar pneumonia (HCC)   Coronary artery disease due to lipid rich plaque   Hypotension   Ventilator dependence (HCC)   Goals of care, counseling/discussion   Palliative care encounter   Advanced care planning/counseling discussion   Allergies  Allergen Reactions  . Codeine   . Penicillins   . Sulfa Antibiotics     Consultations:  General surgery  Cardiology  Pulmonology  Palliative Care   Procedures/Studies: Ct Chest Wo Contrast  Result Date: 03/27/2019 CLINICAL DATA:  Acute respiratory illness.  Hypoxia. EXAM: CT CHEST WITHOUT CONTRAST TECHNIQUE: Multidetector CT imaging of the chest  was performed following the standard protocol without IV contrast. COMPARISON:  None FINDINGS: Cardiovascular:  Moderate cardiac enlargement and small pericardial effusion. Aortic atherosclerosis. Calcification in the left main, left circumflex coronary arteries identified. Mediastinum/Nodes: Normal appearance of the thyroid gland. The trachea appears patent and is midline. Large hiatal hernia. Nasogastric tube is identified. The tube forms a loop within the hiatal hernia and then continues into the intra-abdominal stomach. Right paratracheal lymph node measures 1.5 cm, image 53/4. Left paratracheal lymph node measures 1.6 cm, image 43/4. Subcarinal lymph node measures 2.1 cm. Hilar structures are suboptimally evaluated due to lack of IV contrast material. No axillary or supraclavicular adenopathy. Lungs/Pleura: Small bilateral pleural effusions identified. The left pleural effusion appears mildly loculated. There is subsegmental scratch set partial consolidation in atelectasis of the right lower lobe noted. There is complete airspace consolidation of the entire left lower lobe. Partial consolidation in atelectasis of the lingula. Patchy ground-glass and airspace densities noted within the right middle lobe and central left upper lobe. Upper Abdomen: No acute abnormality. Musculoskeletal: Bones appear osteopenic. Kyphoscoliosis deformity of the thoracic spine noted. IMPRESSION: 1. Small to moderate bilateral pleural effusions. Multifocal, bilateral airspace consolidation identified compatible with pneumonia. The left lower lobe is completely consolidated/collapsed. There is partial consolidation/collapse of the right lower lobe and lingula. Patchy ground-glass and airspace densities are noted within the right middle lobe and both upper lobes. 2. Enlarged mediastinal lymph nodes. In the setting of CHF and pneumonia these are nonspecific and may be reactive. Follow-up imaging with CT of the chest in 3 months is recommended. This recommendation follows ACR consensus guidelines: Managing Incidental Findings on Thoracic CT:  Mediastinal and Cardiovascular Findings. A White Paper of the ACR Incidental Findings Committee. J Am Coll Radiol. 2018; 15: 8469-6295. 3. Cardiac enlargement and small pericardial effusion 4. Aortic Atherosclerosis (ICD10-I70.0). Coronary artery calcifications. Electronically Signed   By: Signa Kell M.D.   On: 03/27/2019 15:36   Ct Abdomen Pelvis W Contrast  Result Date: Apr 18, 2019 CLINICAL DATA:  Constipation since Thursday. Vomiting. Hysterectomy. Appendectomy. High-grade bowel obstruction. EXAM: CT ABDOMEN AND PELVIS WITH CONTRAST TECHNIQUE: Multidetector CT imaging of the abdomen and pelvis was performed using the standard protocol following bolus administration of intravenous contrast. CONTRAST:  OMNIPAQUE IOHEXOL 300 MG/ML  SOLN COMPARISON:  None. FINDINGS: Lower chest: Right base atelectasis. Left base collapse/consolidative change. Moderate cardiomegaly with small pericardial effusion. Small bilateral pleural effusions. A large hiatal hernia, with approximately 1/2 of the stomach positioned in the lower chest. Hepatobiliary: Anterior left hepatic lobe cyst. Cholecystectomy, without biliary ductal dilatation. Pancreas: Normal, without mass or ductal dilatation. Spleen: Normal in size, without focal abnormality. Adrenals/Urinary Tract: Normal adrenal glands. Bilateral renal low-density lesions which are likely cysts and minimally complex cysts. Other renal lesions are too small to characterize. No hydronephrosis. Normal urinary bladder. Stomach/Bowel: The stomach is distended and fluid-filled. The left side of the colon is relatively decompressed with scattered diverticula. Proximal and mid small bowel loops are dilated including at up to 2.9 cm. This continues to the level of a transition point within the central pelvis, likely in the proximal ileum on image 55/3 and coronal image 41. No obstructive mass identified. No complicating ischemia. Vascular/Lymphatic: Aortic and branch vessel  atherosclerosis. No abdominopelvic adenopathy. Reproductive: Hysterectomy.  No adnexal mass. Other: Mild pelvic floor laxity.  No free intraperitoneal air. Musculoskeletal: Lumbosacral spondylosis with moderate convex left lumbar spine curvature. IMPRESSION: 1. High-grade partial small-bowel obstruction, to the level of the mid small bowel.  Most likely related to adhesions. No complicating ischemia. 2. Distended stomach with large hiatal hernia. The patient may benefit from nasogastric tube placement. 3. Small bilateral pleural effusions with left worse than right base airspace disease. On the right, likely atelectasis. On the left, infection or aspiration cannot be excluded. 4. Cardiomegaly with small pericardial effusion. 5.  Aortic Atherosclerosis (ICD10-I70.0). Electronically Signed   By: Jeronimo Greaves M.D.   On: 04/15/2019 14:02   Dg Chest Port 1 View  Result Date: 04/09/2019 CLINICAL DATA:  Hypoxia.  Respiratory failure. EXAM: PORTABLE CHEST 1 VIEW COMPARISON:  04/08/2019. FINDINGS: Endotracheal tube, NG tube, left PICC line stable position. Heart size stable. Unchanged diffuse dense bilateral pulmonary infiltrates/edema. Bilateral pleural effusions again noted. No interim change. No pneumothorax. IMPRESSION: 1.  Lines and tubes in stable position. 2. Unchanged diffuse dense bilateral pulmonary infiltrates/edema again noted. Bilateral pleural effusions again noted. No interim change. Electronically Signed   By: Maisie Fus  Register   On: 04/09/2019 07:14   Dg Chest Port 1 View  Result Date: 04/08/2019 CLINICAL DATA:  Respiratory failure EXAM: PORTABLE CHEST 1 VIEW COMPARISON:  Chest radiograph from one day prior. FINDINGS: Endotracheal tube tip is 2.0 cm above the carina. Enteric tube enters stomach with the tip not seen on this image. Left PICC terminates over the right atrium. Stable cardiomediastinal silhouette with moderate cardiomegaly. No pneumothorax. Moderate left and small right pleural effusions,  stable. Severe fluffy parahilar opacities in both lungs, worsened. Bibasilar atelectasis. IMPRESSION: 1. Well-positioned support structures. 2. Cardiomegaly with worsened severe fluffy bilateral parahilar lung opacities, favor severe cardiogenic pulmonary edema. 3. Moderate left and small right pleural effusions with bibasilar atelectasis, stable. Electronically Signed   By: Delbert Phenix M.D.   On: 04/08/2019 08:40   Dg Chest Port 1 View  Result Date: 04/07/2019 CLINICAL DATA:  Respiratory failure EXAM: PORTABLE CHEST 1 VIEW COMPARISON:  Chest radiograph from one day prior. FINDINGS: Enteric tube enters stomach with the tip not seen on this image. Endotracheal tube tip is 2.5 cm above the carina. Left PICC terminates at the cavoatrial junction. Stable cardiomediastinal silhouette with mild cardiomegaly. No pneumothorax. Stable small bilateral pleural effusions, left greater than right. Severe patchy parahilar and bibasilar lung opacities bilaterally, slightly worsened. IMPRESSION: 1. Well-positioned support structures.  No pneumothorax. 2. Stable small bilateral pleural effusions, left greater than right. 3. Stable cardiomegaly. Slight worsening of severe patchy parahilar and bibasilar lung opacities, favor a combination of pulmonary edema and atelectasis. Electronically Signed   By: Delbert Phenix M.D.   On: 04/07/2019 09:34   Dg Chest Port 1 View  Result Date: 04/06/2019 CLINICAL DATA:  Respiratory failure. EXAM: PORTABLE CHEST 1 VIEW COMPARISON:  04/05/2019. FINDINGS: Endotracheal tube and left PICC line stable position. Cardiomegaly again noted. Persistent bilateral pulmonary infiltrates/edema and bilateral pleural effusions. Similar findings on prior exam. No pneumothorax. Thoracic spine scoliosis. IMPRESSION: 1.  Lines and tubes and stable position. 2. Cardiomegaly again noted. Persistent bilateral prominent pulmonary infiltrates and/or edema again noted without significant change. Bilateral pleural  effusions, left side greater than right again noted. Electronically Signed   By: Maisie Fus  Register   On: 04/06/2019 06:39   Dg Chest Port 1 View  Result Date: 04/05/2019 CLINICAL DATA:  Respiratory failure, hypertension, atrial fibrillation, former smoker EXAM: PORTABLE CHEST 1 VIEW COMPARISON:  Portable exam 0520 hours compared to 04/04/2019 FINDINGS: Tip of endotracheal tube projects 2.5 cm above carina. Nasogastric tube extends into stomach. LEFT arm PICC line with tip projecting over RIGHT  atrium. Enlargement of cardiac silhouette with pulmonary vascular congestion. Persistent perihilar to basilar infiltrates bilaterally. Persistent LEFT pleural effusion. No pneumothorax. IMPRESSION: Persistent pulmonary infiltrates and LEFT pleural effusion. Enlargement of cardiac silhouette with pulmonary vascular congestion. Electronically Signed   By: Lavonia Dana M.D.   On: 04/05/2019 08:20   Dg Chest Port 1 View  Result Date: 04/04/2019 CLINICAL DATA:  Respiratory failure.  Atrial fibrillation EXAM: PORTABLE CHEST 1 VIEW COMPARISON:  04/03/2019 FINDINGS: Multiple tube is a overlies the midline chest making it difficult to be certain endotracheal placement. I cannot tell whether the endotracheal tube is above the carina or in the right mainstem bronchus. Persistent bilateral pulmonary infiltrates left worse than right. Possible mild improvement on the left and worsening on right. There may be an element associated edema as well. Small effusions left more than right. Left arm PICC tip unchanged in the SVC. IMPRESSION: Slight improvement of left lung infiltrates. Slight worsening of right lung infiltrates. Possible mild edema. Small effusions left right. Cannot be sure of endotracheal position due to multiple overlying artifacts. Electronically Signed   By: Nelson Chimes M.D.   On: 04/04/2019 09:11   Dg Chest Port 1 View  Result Date: 04/03/2019 CLINICAL DATA:  Respiratory failure, hypertension, atrial fibrillation  EXAM: PORTABLE CHEST 1 VIEW COMPARISON:  Portable exam 1033 hours compared to 04/02/2019 FINDINGS: LEFT arm PICC line tip projects over SVC. Tip of endotracheal tube projects 4.1 cm above carina. Enlargement of cardiac silhouette. Airspace infiltrates identified throughout mid to lower LEFT lung and at RIGHT base consistent with multifocal pneumonia. Less RIGHT perihilar infiltrate compared to previous exam. Persistent LEFT pleural effusion. No pneumothorax. Bones demineralized. IMPRESSION: Persistent BILATERAL pulmonary infiltrates and LEFT pleural effusion. Electronically Signed   By: Lavonia Dana M.D.   On: 04/03/2019 12:52   Dg Chest Port 1 View  Result Date: 04/02/2019 CLINICAL DATA:  Respiratory failure. EXAM: PORTABLE CHEST 1 VIEW COMPARISON:  04/01/2019. FINDINGS: Endotracheal tube, left PICC line in stable position. Cardiomegaly unchanged. Diffuse bilateral pulmonary infiltrates/edema and bilateral pleural effusions again noted, no change. Persistent basilar atelectasis. No pneumothorax. IMPRESSION: 1.  Left PICC line noted with tip over superior vena cava. 2. Cardiomegaly unchanged. Diffuse bilateral pulmonary infiltrates/edema bilateral pleural effusions again noted, no change. Persistent basilar atelectasis. Electronically Signed   By: Marcello Moores  Register   On: 04/02/2019 07:52   Dg Chest Port 1 View  Result Date: 04/01/2019 CLINICAL DATA:  Acute respiratory failure. Aspiration pneumonia. EXAM: PORTABLE CHEST 1 VIEW COMPARISON:  03/31/2019 FINDINGS: Support lines and tubes in appropriate position. Stable cardiomegaly. Diffuse bilateral airspace disease shows no significant change. Probable small bilateral pleural effusions, also stable. IMPRESSION: No significant change in diffuse bilateral airspace disease and probable bilateral pleural effusions. Stable cardiomegaly. Electronically Signed   By: Earle Gell M.D.   On: 04/01/2019 08:12   Dg Chest Port 1 View  Result Date: 03/31/2019 CLINICAL  DATA:  Respiratory failure. EXAM: PORTABLE CHEST 1 VIEW COMPARISON:  03/30/2019 FINDINGS: The patient is rotated to the left, and the patient's chin partially obscures the left lung apex. An endotracheal tube remains in place terminating approximately 1.5 cm of above the carina, not significantly changed. An enteric tube courses into the abdomen with tip not imaged. A left PICC terminates over the right atrium. There is persistent extensive airspace opacity throughout both lungs, stable to minimally improved. IMPRESSION: Stable to minimally improved extensive bilateral airspace disease. Electronically Signed   By: Logan Bores M.D.   On:  03/31/2019 09:39   Dg Chest Port 1 View  Result Date: 03/30/2019 CLINICAL DATA:  Shortness of breath. EXAM: PORTABLE CHEST 1 VIEW COMPARISON:  03/27/2019 FINDINGS: The heart is enlarged but stable.  Left PICC line appears stable. Severe and progressive bilateral airspace process. Persistent small pleural effusions. No pneumothorax. IMPRESSION: Severe and progressive bilateral airspace process. Electronically Signed   By: Rudie Meyer M.D.   On: 03/30/2019 10:41   Dg Chest Port 1 View  Result Date: 03/27/2019 CLINICAL DATA:  Acute respiratory failure with hypoxia. EXAM: PORTABLE CHEST 1 VIEW COMPARISON:  One-view chest x-ray 03/26/2019 FINDINGS: Heart is enlarged. Left hemidiaphragm is elevated. NG tube is in the stomach. Left-sided PICC line terminates in the right atrium. Pulmonary vascular congestion is increased. Bilateral infiltrates have increased. IMPRESSION: 1. Increasing bilateral infiltrates, left greater than right. This is concerning for infection. 2. Elevation of left hemidiaphragm. 3. Stable cardiomegaly. 4. Left-sided PICC line terminates in the right atrium. Electronically Signed   By: Marin Roberts M.D.   On: 03/27/2019 08:37   Dg Chest Port 1 View  Result Date: 03/26/2019 CLINICAL DATA:  Follow-up PICC line placement EXAM: PORTABLE CHEST 1 VIEW  COMPARISON:  April 17, 2019 FINDINGS: Left-sided PICC line is noted with catheter tip at the cavoatrial junction. Gastric catheter is noted coiled within the stomach. Elevation of the left hemidiaphragm is noted with some associated infiltrates bilaterally left greater than right similar to that seen on the prior exam. No bony abnormality is noted. IMPRESSION: Stable infiltrates bilaterally left greater than right. New left-sided PICC line in satisfactory position. Electronically Signed   By: Alcide Clever M.D.   On: 03/26/2019 12:46   Dg Chest Port 1 View  Result Date: 04/17/19 CLINICAL DATA:  NG tube placement. EXAM: PORTABLE CHEST 1 VIEW COMPARISON:  Apr 17, 2019 and CT 17-Apr-2019 FINDINGS: Patient is slightly rotated to the left. Nasogastric tube is coiled once over the stomach in patient's known large hiatal hernia and then proceeds inferiorly over the left upper quadrant and off the film as tip is not visualized. Non radiopaque side-port is over the stomach in the left upper abdomen. Exam demonstrates adequate lung volumes with worsening bilateral perihilar opacification suggesting interstitial edema and less likely infection. Persistent opacification over the left base likely effusion with atelectasis. Stable mild hazy density over the right base which may represent a small effusion with atelectasis. Infection over the lung bases is possible. Stable cardiomegaly. Remainder of the exam is unchanged. IMPRESSION: Worsening bilateral perihilar opacification suggesting worsening interstitial edema. Stable bibasilar opacification likely small effusions left greater than right with associated basilar atelectasis. Infection in the mid to lower lungs is possible. Stable cardiomegaly. Nasogastric tube coiled once over the large hiatal hernia with side-port over the stomach in the left upper quadrant and tip not visualized. Electronically Signed   By: Elberta Fortis M.D.   On: 04-17-2019 20:28   Dg Chest Portable 1  View  Result Date: 2019/04/17 CLINICAL DATA:  Nasogastric tube placement EXAM: PORTABLE CHEST 1 VIEW COMPARISON:  Abdominal CT from earlier today FINDINGS: Nasogastric tube which at least reaches the stomach (which is low lying and distended by CT). There is haziness at both lung bases where there is atelectasis and pleural fluid by prior CT. Cardiopericardial enlargement. IMPRESSION: 1. The nasogastric tube reaches the stomach at least. 2. Cardiomegaly with bilateral pleural effusion obscuring the lower lobes. Electronically Signed   By: Marnee Spring M.D.   On: 04/17/19 16:36   Dg Chest Port 1v  Same Day  Result Date: 03/30/2019 CLINICAL DATA:  Status post intubation and OG tube placement today. EXAM: PORTABLE CHEST 1 VIEW COMPARISON:  Single-view of the chest earlier today. FINDINGS: Endotracheal tube tip is 1.5 cm above the carina. The tube could be withdrawn 1-2 cm for better positioning. OG tube courses into the stomach and below the inferior margin the film. Severe and diffuse bilateral airspace disease is again seen. Cardiac silhouette is obscured. No pneumothorax is identified. IMPRESSION: ETT tip is 1.5 cm above the carina. The tube could be withdrawn 1-2 cm. OG tube courses into the stomach and below the inferior margin the film. No change in diffuse bilateral airspace disease. Electronically Signed   By: Drusilla Kannerhomas  Dalessio M.D.   On: 03/30/2019 11:53   Dg Abd 2 Views  Result Date: 03/30/2019 CLINICAL DATA:  Follow up small bowel obstruction EXAM: ABDOMEN - 2 VIEW COMPARISON:  03/26/2019 FINDINGS: Stable scoliosis is noted. Bibasilar infiltrative changes are seen. A few persistent loops of dilated small bowel are seen. No free air is noted. The overall appearance has improved slightly in the interval from the prior exam. IMPRESSION: Slight improvement in mild small bowel dilatation. No new focal abnormality is seen. Electronically Signed   By: Alcide CleverMark  Lukens M.D.   On: 03/30/2019 08:08   Dg Abd  Portable 2v  Result Date: 03/26/2019 CLINICAL DATA:  Small bowel obstruction, colon resection. EXAM: PORTABLE ABDOMEN - 2 VIEW COMPARISON:  CT abdomen pelvis March 29, 2019. FINDINGS: Nasogastric tube terminates in the stomach. Persistent small bowel dilatation. No minimal colonic gas. Retained contrast in the bladder with Foley catheter in place. Postoperative changes in the left lower quadrant. Incidental imaging of the lower chest shows left lower lobe consolidation with patchy airspace opacification bilaterally. Bilateral pleural effusions are noted as well. IMPRESSION: 1. Persistent small bowel obstruction. 2. Left lower lobe consolidation with patchy bilateral airspace opacification and bilateral pleural effusions, better evaluated on chest radiograph done the same day. Electronically Signed   By: Leanna BattlesMelinda  Blietz M.D.   On: 03/26/2019 12:46     Discharge Exam: Vitals:   03/30/2019 1600 03/24/2019 1700  BP: (!) 118/51 (!) 128/56  Pulse:    Resp: (!) 24 (!) 47  Temp:    SpO2: 97% 95%   Vitals:   04/09/2019 1500 04/13/2019 1600 04/09/2019 1700 04/12/2019 1727  BP: 107/67 (!) 118/51 (!) 128/56   Pulse:      Resp: (!) 26 (!) 24 (!) 47   Temp:      TempSrc:      SpO2:  97% 95%   Weight:    (P) 52.9 kg  Height:    (P) 5\' 4"  (1.626 m)     The results of significant diagnostics from this hospitalization (including imaging, microbiology, ancillary and laboratory) are listed below for reference.     Microbiology: No results found for this or any previous visit (from the past 240 hour(s)).   Labs: BNP (last 3 results) Recent Labs    03/26/19 0830  BNP 950.0*   Basic Metabolic Panel: Recent Labs  Lab 04/04/19 0408 04/05/19 0407 04/06/19 0404 04/07/19 0439 04/08/19 0421 04/09/19 0412 04/11/2019 0420  NA 149* 151* 151* 146* 146* 147* 148*  K 3.3* 3.8 4.0 3.6 3.7 3.9 4.0  CL 105 109 110 104 105 104 105  CO2 28 31 31  32 31 30 32  GLUCOSE 196* 145* 171* 186* 187* 149* 180*  BUN 80* 93* 94*  86* 88* 105* 120*  CREATININE 1.01*  1.17* 1.17* 0.97 1.02* 1.30* 1.54*  CALCIUM 9.1 9.1 8.9 8.8* 9.5 9.2 9.5  MG 2.0 2.1  --   --   --  2.6* 2.7*   Liver Function Tests: Recent Labs  Lab 04/04/19 0408 04/06/19 0404 04/07/19 0439 04/08/19 0421  AST 50* 32 31 17  ALT 93* 65* 61* 40  ALKPHOS 76 69 74 69  BILITOT 0.5 0.8 0.9 1.4*  PROT 4.6* 4.6* 4.9* 5.8*  ALBUMIN 1.9* 1.8* 1.9* 3.3*   No results for input(s): LIPASE, AMYLASE in the last 168 hours. No results for input(s): AMMONIA in the last 168 hours. CBC: Recent Labs  Lab 04/06/19 0404 04/07/19 0439 04/08/19 0421 04/09/19 0412 03/29/2019 0420  WBC 26.9* 27.3* 21.8* 18.1* 21.6*  HGB 10.8* 11.9* 11.2* 9.9* 11.3*  HCT 35.4* 38.5 37.2 32.4* 37.5  MCV 96.7 96.0 97.6 98.2 97.2  PLT 182 196 188 149* 214   Cardiac Enzymes: No results for input(s): CKTOTAL, CKMB, CKMBINDEX, TROPONINI in the last 168 hours. BNP: Invalid input(s): POCBNP CBG: Recent Labs  Lab 04/09/19 1113 04/09/19 1609 04/09/19 1946 04/03/2019 0742 04/16/2019 1121  GLUCAP 95 166* 105* 135* 152*   D-Dimer No results for input(s): DDIMER in the last 72 hours. Hgb A1c No results for input(s): HGBA1C in the last 72 hours. Lipid Profile No results for input(s): CHOL, HDL, LDLCALC, TRIG, CHOLHDL, LDLDIRECT in the last 72 hours. Thyroid function studies No results for input(s): TSH, T4TOTAL, T3FREE, THYROIDAB in the last 72 hours.  Invalid input(s): FREET3 Anemia work up No results for input(s): VITAMINB12, FOLATE, FERRITIN, TIBC, IRON, RETICCTPCT in the last 72 hours. Urinalysis    Component Value Date/Time   COLORURINE AMBER (A) 01-03-19 1158   APPEARANCEUR HAZY (A) 01-03-19 1158   LABSPEC 1.027 01-03-19 1158   PHURINE 5.0 01-03-19 1158   GLUCOSEU NEGATIVE 01-03-19 1158   HGBUR NEGATIVE 01-03-19 1158   BILIRUBINUR NEGATIVE 01-03-19 1158   KETONESUR 20 (A) 01-03-19 1158   PROTEINUR 100 (A) 01-03-19 1158   UROBILINOGEN 0.2 07/24/2014  1534   NITRITE NEGATIVE 01-03-19 1158   LEUKOCYTESUR NEGATIVE 01-03-19 1158   Sepsis Labs Invalid input(s): PROCALCITONIN,  WBC,  LACTICIDVEN Microbiology No results found for this or any previous visit (from the past 240 hour(s)).   Time coordinating discharge: 40 minutes  SIGNED:   Erick BlinksPratik D Fradel Baldonado, DO [facility] 03/29/2019, 5:37 PM  If 7PM-7AM, please contact night-coverage [email or web address] Password [email or web address]

## 2019-04-18 NOTE — Progress Notes (Signed)
Planned extubation today at 66 son and another family member in room. Changed fentanyl to morphine at 2 mg/hr. Before extubation for comfort. After extubation patients oxygenation and breathing became worse and at 1724 patient past.

## 2019-04-18 NOTE — Progress Notes (Signed)
ANTICOAGULATION CONSULT NOTE - Follow Up Consult  Pharmacy Consult for heparin Indication: atrial fibrillation  HEPARIN DW (KG): 47.6   Labs: Recent Labs    04/07/19 0439  04/08/19 0421 04/09/19 0412 04/09/19 1540 2019/04/22 0009  HGB 11.9*  --  11.2* 9.9*  --   --   HCT 38.5  --  37.2 32.4*  --   --   PLT 196  --  188 149*  --   --   HEPARINUNFRC 0.46   < >  --  <0.10* 0.26* 0.35  CREATININE 0.97  --  1.02* 1.30*  --   --    < > = values in this interval not displayed.    Assessment: 85yo female with history of atrial fibrillation. Confirmed with RN that heparin has been running without interruptions.  Patient's heparin level has been inconsistent throughout admission.  Will be cautious with dose increase as patient has gone supratherapeutic once doses start going over 1000 units/hr.  Patient's family is considering transition to comfort care.  6/23 AM update:   Heparin level therapeutic x 1 after rate increase  Goal of Therapy:  Heparin level 0.3-0.7 units/ml  Monitor platelets per protocol   Plan:  Cont heparin at 1150 units/hr Confirmatory heparin level with AM labs Continue to monitor H&H and s/s of bleeding.  Narda Bonds, PharmD, BCPS Clinical Pharmacist Phone: [phone]

## 2019-04-18 NOTE — Progress Notes (Signed)
Present with Carrie Morales and her family, son, Tressie Ellis and daughter-in-law for emotional and spiritual support as she prepares to come off ventilator support.

## 2019-04-18 NOTE — Progress Notes (Signed)
Post death. Morphine was wasted in stericycle 75 mls. Was hung as primary and tubing had to be flushed with morphine at time of infusion. Witnessed by Tamala Fothergill RN.

## 2019-04-18 NOTE — Progress Notes (Signed)
Changed fentanyl to morphine for end of life. Wasted 30 mls. In stericycle. Witnessed by Tamala Fothergill RN.

## 2019-04-18 NOTE — Progress Notes (Signed)
Daily Progress Note   Patient Name: Carrie Morales       Date: 27-Apr-2019 DOB: Dec 06, 1927  Age: 85 y.o. MRN#: [id number] Attending Physician: Rodena Goldmann, DO Primary Care Physician: Earney Mallet, MD Admit Date: 04/15/2019  Reason for Consultation/Follow-up: Establishing goals of care  Subjective: Patient in bed. More lethargic today. Hands and feet continue to be mottled.  Met with patient's son, Tressie Ellis, and her daughter in law to further discuss [place] and expectations. Appreciate Chaplain Joya Gaskins also being present. Discussed all interventions that are currently prolonging patient's life aside from ventilation including artificial feed, hydration, medications.  Discussed comfort measures vs aggressive medical care after extubation, and what family's hopes are.  Family is understanding that patient would not want to continue to live her life in a nursing facility, especially under current visitor restrictions and with the likelihood that she would continue to decline and die inevitably.  They note the preference would be for her to have a planned, natural dying, process, without further interventions to prolong, as they do not feel these interventions will increase her quality of life.  With that in mind, we discussed making a full transition to comfort care. Withdrawing continued life sustaining measures, and providing patient with comfort medications only.   Addendum:   I remained at bedside as patient was extubated. Oxygen saturations decreased rapidly and she was unable to maintain her airway. Comfort measures and family support was provided and patient experienced peaceful natural death.   ROS  Length of Stay: 16  Current Medications: Scheduled Meds:  . chlorhexidine  gluconate (MEDLINE KIT)  15 mL Mouth Rinse BID  . Chlorhexidine Gluconate Cloth  6 each Topical Daily    Continuous Infusions: . morphine      PRN Meds: acetaminophen **OR** acetaminophen, diphenhydrAMINE, glycopyrrolate **OR** glycopyrrolate **OR** glycopyrrolate, haloperidol lactate, LORazepam, morphine, ondansetron **OR** ondansetron (ZOFRAN) IV, phenol, polyvinyl alcohol  Physical Exam          Vital Signs: BP 123/63   Pulse (!) 120   Temp 98.2 F (36.8 C) (Core)   Resp (!) 24   Ht '5\' 4"'$  (1.626 m)   Wt 52.9 kg   SpO2 99%   BMI 20.02 kg/m  SpO2: SpO2: 99 % O2 Device: O2 Device: Ventilator O2 Flow Rate: O2 Flow Rate (L/min): 50 L/min  Intake/output summary:   Intake/Output Summary (Last 24 hours) at Apr 18, 2019 1631 Last data filed at 04/18/2019 0847 Gross per 24 hour  Intake 680.08 ml  Output 30 ml  Net 650.08 ml   LBM: Last BM Date: 04/07/19 Baseline Weight: Weight: 52.2 kg Most recent weight: Weight: 52.9 kg       Palliative Assessment/Data: PPS: 10%     Patient Active Problem List   Diagnosis Date Noted  . Advanced care planning/counseling discussion   . Hypotension 04/05/2019  . Ventilator dependence ([place])   . Goals of care, counseling/discussion   . Palliative care encounter   . Coronary artery disease due to lipid rich plaque   . Lobar pneumonia ([place]) 03/27/2019  . Elevated troponin   . Respiratory failure ([place]) 03/26/2019  . Acute diastolic CHF (congestive heart failure) ([place]) 03/26/2019  . Atrial fibrillation with RVR ([place])   . SBO (small bowel obstruction) ([place]) 03/24/2019  . Essential hypertension 03/24/2019  . Chronic a-fib 04/03/2019  . PNA (pneumonia)--Aspiration 03/26/2019    Palliative Care Assessment & Plan   Patient Profile: 85 y.o.femalewith past medical history of atrial fibrillation, HTN, colonic diverticular abscess, osteopeniaadmitted on 6/7/2020with constipation and vomiting and found to have high grade SBO along with  likely aspiration pneumonia with sepsis.She ultimately required intubation and has not made much progress to wean. She has struggled with hypotension limiting control of atrial fibrillation and diuresis.Palliative medicine consulted for [place].  Assessment/Recommendations/Plan   Extubation to comfort measures only  D/C all measures not intended for comfort- d/c IV fluids, feeding tube, medications that do not provide comfort  Start morphine infusion at '1mg'$ /hr with '2mg'$ /hr bolus prn increased RR or signs of discomfort  Robinul, lorazepam as needed  Goals of Care and Additional Recommendations:  Limitations on Scope of Treatment: Full Comfort Care  Code Status:  DNR  Prognosis:   Hours - Days  Discharge Planning:  Anticipated Hospital Death  Care plan was discussed with patient's son, daughter in law, Dr. Manuella Ghazi.  Thank you for allowing the Palliative Medicine Team to assist in the care of this patient.   Time In: 8301,5996 Time Out: 1400, 1730 Total Time 120 minutes Prolonged Time Billed yes      Greater than 50%  of this time was spent counseling and coordinating care related to the above assessment and plan.  Mariana Kaufman, AGNP-C Palliative Medicine   Please contact Palliative Medicine Team phone at [phone] for questions and concerns.

## 2019-04-18 NOTE — Progress Notes (Signed)
ANTICOAGULATION CONSULT NOTE - Follow Up Consult  Pharmacy Consult for heparin Indication: atrial fibrillation  HEPARIN DW (KG): 47.6   Labs: Recent Labs    04/08/19 0421 04/09/19 0412 04/09/19 1540 03/21/2019 0009 04/06/2019 0420  HGB 11.2* 9.9*  --   --  11.3*  HCT 37.2 32.4*  --   --  37.5  PLT 188 149*  --   --  214  HEPARINUNFRC  --  <0.10* 0.26* 0.35 0.17*  CREATININE 1.02* 1.30*  --   --  1.54*    Assessment: 85yo female with history of atrial fibrillation. Confirmed with RN that heparin has been running without interruptions.  Patient's heparin level has been inconsistent throughout admission.  Will be cautious with dose increase as patient has gone supratherapeutic once doses start going over 1200 units/hr.  Patient's family is considering transition to comfort care.  HL 0.17   Goal of Therapy:  Heparin level 0.3-0.7 units/ml  Monitor platelets per protocol   Plan:  Increase heparin infusion to 1250 units/hr. Heparin level in 8 hours and daily while on heparin. Continue to monitor H&H and s/s of bleeding.   Margot Ables, PharmD Clinical Pharmacist 04/06/2019 8:32 AM

## 2019-04-18 NOTE — Progress Notes (Signed)
Pt extubated to RA with son and daughter in law at bedside.

## 2019-04-18 NOTE — Progress Notes (Addendum)
PROGRESS NOTE    Carrie Morales  JIR:[id number] DOB: 03-18-1928 DOA: 04/08/2019 PCP: Earney Mallet, MD   Brief Narrative:  Per HPI: 85 year old female with a history of chronic atrial fibrillation on apixaban, diastolic CHF, hypertension, bowel obstruction status post bowel resection 30 years ago presenting with 1 week history of abdominal pain that worsened over the past 2 to 3 days to the point of resulting in nausea and vomiting. The patient was also having constipation-like symptoms and tried some over-the-counter laxatives without relief. Because of her progressive symptoms she presented for further evaluation. CT of the abdomen and pelvis in the ED showed left basilar collapse less consolidative changes, small bilateral pleural effusions, left greater than right with a large hiatus hernia. There was also dilatation of the proximal and mid small bowel loops up to 2.9 cm consistent with a high-grade SBO. The patient was treated with bowel rest and IV fluids. When she arrived to the medical floor, the patient developed atrial fibrillation with RVR and respiratory distress. She was transferred to the stepdown unit and started on diltiazem drip and placed on BiPAP. Subsequently, the patient became hypotensive requiring fluid resuscitation. Repeat chest x-ray show worsening bilateral perihilar densities with stable bibasilar opacifications. Lactic acid peaked at 4.4. The patient was started on ceftriaxone and metronidazole.  She has now completed course of treatment with Merrem and continues to remain on the ventilator due to hypoxemic and hypercapnic respiratory failure. She has been weaned off amiodarone on 6/17 and remains in atrial fibrillation, but is rate controlled and on heparin drip.She will be given some albumin as well as further diuresis on 6/20. Steroids have also been weaned on 6/20. She continues to do some ventilator weaning daily.She has received some albumin on  6/20 and is now volume overloaded and requiring high amounts of oxygen on the ventilator.  Increase diuresis was attempted on 6/21 and appears to have contributed further to some AKI with no significant urine output.  Her FiO2 on ventilator has been decreased to 50% from 75% on 6/22.  She continues to have some ongoing tachycardia with soft blood pressure readings and does not appear to be appropriate for further IV Lasix.  Family members do not want to pursue further aggressive measures with tracheostomy in the next few days and it appears that overall condition is not improving.  Plans are for one-way extubation this afternoon with possible need for transition to full comfort measures soon thereafter.  Assessment & Plan:   Principal Problem:   SBO (small bowel obstruction) (HCC) Active Problems:   Essential hypertension   Chronic a-fib   PNA (pneumonia)--Aspiration   Respiratory failure (HCC)   Acute diastolic CHF (congestive heart failure) (HCC)   Atrial fibrillation with RVR (HCC)   Elevated troponin   Lobar pneumonia (HCC)   Coronary artery disease due to lipid rich plaque   Hypotension   Ventilator dependence (HCC)   Goals of care, counseling/discussion   Palliative care encounter   Advanced care planning/counseling discussion   Sepsis secondary to pneumonia-resolved -Merrem has completed 6/17,leukocytosis now downtrending -Procalcitonin low -Blood cultures with no growth and urine analysis negative  Acute hypoxemic and hypercapnic respiratory failure secondarytoabove with ARDS -Plan for aggressive diuresis starting today and hold further fluid intake as well as albumin today. -Appreciate pulmonology assistance and recommendations with FiO250%% today. -Continue on prednisone per pulmonology -Chest x-ray demonstratingongoing pulmonary vascular congestion and ABG stable; repeat pending -Long discussion with son today with plans for one-way extubation later this  afternoon  with possible need for transition to full comfort care based on response in the next 24 hours.  Family members do not want patient to have tracheostomy and have agreed to DNR status which appears appropriate.  Small bowel obstruction-resolved -SBO has resolved and appreciate general surgery recommendations -Continue electrolyte supplementation as tolerated -Continuecurrenttube feeds  Atrial fibrillation with RVR-ongoing -Continue on amiodarone drip with improved blood pressure readings noted -Continue heparin drip  Acute on chronic diastolic CHF -Continue to monitor I's and O's as well as daily weights -Chest x-ray continues to demonstrate pulmonary vascular congestionwith repeat performed today 6/19 -No further Lasix given kidney injury and poor blood pressures  AKI on CKD stage III (baseline creatinine 0.8-1.1) -Continues to have worsening urine output -No further Lasix or IV fluid at this time given soft blood pressures as well as some volume overload respective -Repeat renal panel for am -Consider Nephrology evaluation if patient does well after extubation and does not transition fully to comfort care  Pericardial effusion with MR -Repeat echocardiogram per cardiologyperformed 6/18 with no significant worsening effusion -No tamponade physiology noted  Mild hyperglycemia-likely steroid-induced; stable -A1c 5.3% -Continue close monitoring  Mild diarrhea-resolved  Hypoalbuminemia-improved -Discontinue further infusions due to volume overload and poor response   DVT prophylaxis:IV heparin Code Status:DNR Family Communication:Updated son, Nicole Kindred, at bedside on 6/23 Disposition Plan:Extensive discussions had with son today as well as yesterday afternoon. Family does not want tracheostomy and she is approaching the need for this. Patient is now DNR with plans for one-way extubation this afternoon once family members at bedside.  Likely comfort care to start  thereafter based on symptoms with full transition in the next 24 hours.  Palliative care following.   Consultants:  General surgery  Cardiology  Pulmonology  Palliative care  Procedures:  None  Antimicrobials: Ceftriaxone/flagyl 6/7>>6/8 Merrem 6/8>>>6/17   Subjective: Patient seen and evaluated today with no new acute complaints or concerns. No acute concerns or events noted overnight.  Her blood pressure has improved on amiodarone drip, but she looks fatigued and lethargic overall.  Objective: Vitals:   04-13-19 0803 04-13-19 1111 04-13-2019 1117 Apr 13, 2019 1424  BP:      Pulse:      Resp:      Temp: 98.2 F (36.8 C)  98.2 F (36.8 C)   TempSrc: Core  Core   SpO2:  97%  99%  Weight:      Height:        Intake/Output Summary (Last 24 hours) at 04/13/19 1510 Last data filed at 04/13/19 0847 Gross per 24 hour  Intake 947.87 ml  Output 230 ml  Net 717.87 ml   Filed Weights   04/08/19 0500 04/09/19 0530 2019/04/13 0500  Weight: 53.6 kg 57.2 kg 52.9 kg    Examination:  General exam: Appears calm and comfortable  Respiratory system: Clear to auscultation. Respiratory effort normal.  Currently intubated on FiO2 50%. Cardiovascular system: S1 & S2 heard, irregular and tachycardic. No JVD, murmurs, rubs, gallops or clicks. No pedal edema. Gastrointestinal system: Abdomen is nondistended, soft and nontender. No organomegaly or masses felt. Normal bowel sounds heard. Central nervous system: Alert and awake Extremities: Symmetric 5 x 5 power. Skin: No rashes, lesions or ulcers Psychiatry: Difficult to assess.    Data Reviewed: I have personally reviewed following labs and imaging studies  CBC: Recent Labs  Lab 04/06/19 0404 04/07/19 0439 04/08/19 0421 04/09/19 0412 13-Apr-2019 0420  WBC 26.9* 27.3* 21.8* 18.1* 21.6*  HGB 10.8* 11.9*  11.2* 9.9* 11.3*  HCT 35.4* 38.5 37.2 32.4* 37.5  MCV 96.7 96.0 97.6 98.2 97.2  PLT 182 196 188 149* 409   Basic  Metabolic Panel: Recent Labs  Lab 04/04/19 0408 04/05/19 0407 04/06/19 0404 04/07/19 0439 04/08/19 0421 04/09/19 0412 04/28/19 0420  NA 149* 151* 151* 146* 146* 147* 148*  K 3.3* 3.8 4.0 3.6 3.7 3.9 4.0  CL 105 109 110 104 105 104 105  CO2 '28 31 31 '$ 32 31 30 32  GLUCOSE 196* 145* 171* 186* 187* 149* 180*  BUN 80* 93* 94* 86* 88* 105* 120*  CREATININE 1.01* 1.17* 1.17* 0.97 1.02* 1.30* 1.54*  CALCIUM 9.1 9.1 8.9 8.8* 9.5 9.2 9.5  MG 2.0 2.1  --   --   --  2.6* 2.7*   GFR: Estimated Creatinine Clearance: 21.1 mL/min (A) (by C-G formula based on SCr of 1.54 mg/dL (H)). Liver Function Tests: Recent Labs  Lab 04/04/19 0408 04/06/19 0404 04/07/19 0439 04/08/19 0421  AST 50* 32 31 17  ALT 93* 65* 61* 40  ALKPHOS 76 69 74 69  BILITOT 0.5 0.8 0.9 1.4*  PROT 4.6* 4.6* 4.9* 5.8*  ALBUMIN 1.9* 1.8* 1.9* 3.3*   No results for input(s): LIPASE, AMYLASE in the last 168 hours. No results for input(s): AMMONIA in the last 168 hours. Coagulation Profile: No results for input(s): INR, PROTIME in the last 168 hours. Cardiac Enzymes: No results for input(s): CKTOTAL, CKMB, CKMBINDEX, TROPONINI in the last 168 hours. BNP (last 3 results) No results for input(s): PROBNP in the last 8760 hours. HbA1C: No results for input(s): HGBA1C in the last 72 hours. CBG: Recent Labs  Lab 04/09/19 1113 04/09/19 1609 04/09/19 1946 04/28/2019 0742 04/28/19 1121  GLUCAP 95 166* 105* 135* 152*   Lipid Profile: No results for input(s): CHOL, HDL, LDLCALC, TRIG, CHOLHDL, LDLDIRECT in the last 72 hours. Thyroid Function Tests: No results for input(s): TSH, T4TOTAL, FREET4, T3FREE, THYROIDAB in the last 72 hours. Anemia Panel: No results for input(s): VITAMINB12, FOLATE, FERRITIN, TIBC, IRON, RETICCTPCT in the last 72 hours. Sepsis Labs: Recent Labs  Lab 04/05/19 0813 04/06/19 0404 04/07/19 0440  PROCALCITON 0.16 0.16 <0.10  LATICACIDVEN  --  1.9  --     No results found for this or any  previous visit (from the past 240 hour(s)).       Radiology Studies: Dg Chest Port 1 View  Result Date: 04/09/2019 CLINICAL DATA:  Hypoxia.  Respiratory failure. EXAM: PORTABLE CHEST 1 VIEW COMPARISON:  04/08/2019. FINDINGS: Endotracheal tube, NG tube, left PICC line stable position. Heart size stable. Unchanged diffuse dense bilateral pulmonary infiltrates/edema. Bilateral pleural effusions again noted. No interim change. No pneumothorax. IMPRESSION: 1.  Lines and tubes in stable position. 2. Unchanged diffuse dense bilateral pulmonary infiltrates/edema again noted. Bilateral pleural effusions again noted. No interim change. Electronically Signed   By: Marcello Moores  Register   On: 04/09/2019 07:14        Scheduled Meds:  chlorhexidine gluconate (MEDLINE KIT)  15 mL Mouth Rinse BID   Chlorhexidine Gluconate Cloth  6 each Topical Daily   feeding supplement (OSMOLITE 1.5 CAL)  1,000 mL Per Tube Q24H   feeding supplement (PRO-STAT SUGAR FREE 64)  30 mL Per Tube TID   levalbuterol  0.63 mg Nebulization TID   mouth rinse  15 mL Mouth Rinse 10 times per day   pantoprazole (PROTONIX) IV  40 mg Intravenous Q24H   predniSONE  40 mg Oral Q breakfast   sodium  chloride flush  10-40 mL Intracatheter Q12H   sodium chloride flush  3 mL Intravenous Q12H   Continuous Infusions:  sodium chloride Stopped (04/05/19 0807)   amiodarone 30 mg/hr (04-May-2019 7011)   fentaNYL infusion INTRAVENOUS 25 mcg/hr (04/09/19 0300)     LOS: 16 days    Time spent: 30 minutes    Glada Wickstrom Darleen Crocker, DO [facility] Pager [phone]  If 7PM-7AM, please contact night-coverage [email or web address] Password [email or web address] 04-May-2019, 3:10 PM

## 2019-04-18 NOTE — Progress Notes (Signed)
Subjective: She looks tired.  She still responds with nods and thumbs up.  She remains intubated and on the ventilator.  Blood pressure is a little bit better.  She is not as mottled as yesterday.  Objective: Vital signs in last 24 hours: Temp:  [98.2 F (36.8 C)-100 F (37.8 C)] 98.2 F (36.8 C) (06/23 0803) Resp:  [16-43] 24 (06/23 0800) BP: (78-147)/(45-89) 123/63 (06/23 0800) SpO2:  [90 %-100 %] 96 % (06/23 0800) FiO2 (%):  [50 %] 50 % (06/23 0742) Weight:  [52.9 kg] 52.9 kg (06/23 0500) Weight change: -4.3 kg Last BM Date: 04/07/19  Intake/Output from previous day: 06/22 0701 - 06/23 0700 In: 958.5 [I.V.:383.5; NG/GT:575] Out: 230 [Urine:230]  PHYSICAL EXAM General appearance: Intubated on the ventilator but responsive Resp: rhonchi bilaterally Cardio: Irregularly irregular with loud systolic murmur GI: soft, non-tender; bowel sounds normal; no masses,  no organomegaly Extremities: Still some third spacing of fluid  Lab Results:  Results for orders placed or performed during the hospital encounter of 03/30/2019 (from the past 48 hour(s))  Glucose, capillary     Status: Abnormal   Collection Time: 04/08/19 11:19 AM  Result Value Ref Range   Glucose-Capillary 113 (H) 70 - 99 mg/dL  Glucose, capillary     Status: Abnormal   Collection Time: 04/08/19  4:45 PM  Result Value Ref Range   Glucose-Capillary 151 (H) 70 - 99 mg/dL  Glucose, capillary     Status: Abnormal   Collection Time: 04/08/19  8:15 PM  Result Value Ref Range   Glucose-Capillary 158 (H) 70 - 99 mg/dL   Comment 1 Notify RN    Comment 2 Document in Chart   Glucose, capillary     Status: Abnormal   Collection Time: 04/08/19 11:25 PM  Result Value Ref Range   Glucose-Capillary 164 (H) 70 - 99 mg/dL   Comment 1 Notify RN    Comment 2 Document in Chart   Glucose, capillary     Status: Abnormal   Collection Time: 04/09/19  3:52 AM  Result Value Ref Range   Glucose-Capillary 112 (H) 70 - 99 mg/dL   Comment 1 Notify RN    Comment 2 Document in Chart   Heparin level (unfractionated)     Status: Abnormal   Collection Time: 04/09/19  4:12 AM  Result Value Ref Range   Heparin Unfractionated <0.10 (L) 0.30 - 0.70 IU/mL    Comment: (NOTE) If heparin results are below expected values, and patient dosage has  been confirmed, suggest follow up testing of antithrombin III levels. Performed at [facility], [address] [place], [place] [address]   CBC     Status: Abnormal   Collection Time: 04/09/19  4:12 AM  Result Value Ref Range   WBC 18.1 (H) 4.0 - 10.5 K/uL   RBC 3.30 (L) 3.87 - 5.11 MIL/uL   Hemoglobin 9.9 (L) 12.0 - 15.0 g/dL   HCT 32.4 (L) 36.0 - 46.0 %   MCV 98.2 80.0 - 100.0 fL   MCH 30.0 26.0 - 34.0 pg   MCHC 30.6 30.0 - 36.0 g/dL   RDW 15.2 11.5 - 15.5 %   Platelets 149 (L) 150 - 400 K/uL   nRBC 0.1 0.0 - 0.2 %    Comment: Performed at [facility], [address], [place], [place] [address]  Basic metabolic panel     Status: Abnormal   Collection Time: 04/09/19  4:12 AM  Result Value Ref Range   Sodium 147 (  H) 135 - 145 mmol/L   Potassium 3.9 3.5 - 5.1 mmol/L   Chloride 104 98 - 111 mmol/L   CO2 30 22 - 32 mmol/L   Glucose, Bld 149 (H) 70 - 99 mg/dL   BUN 105 (H) 8 - 23 mg/dL    Comment: RESULTS CONFIRMED BY MANUAL DILUTION   Creatinine, Ser 1.30 (H) 0.44 - 1.00 mg/dL   Calcium 9.2 8.9 - 10.3 mg/dL   GFR calc non Af Amer 37 (L) >60 mL/min   GFR calc Af Amer 42 (L) >60 mL/min   Anion gap 13 5 - 15    Comment: Performed at [facility], [address], [place], [place] [address]  Magnesium     Status: Abnormal   Collection Time: 04/09/19  4:12 AM  Result Value Ref Range   Magnesium 2.6 (H) 1.7 - 2.4 mg/dL    Comment: Performed at [facility], [address], [place], [place] [address]  Blood gas, arterial     Status: Abnormal   Collection Time: 04/09/19  4:50 AM  Result Value Ref Range   FIO2 50.00    pH, Arterial 7.475 (H) 7.350 - 7.450   pCO2 arterial 47.8  32.0 - 48.0 mmHg   pO2, Arterial 47.2 (L) 83.0 - 108.0 mmHg   Bicarbonate 33.0 (H) 20.0 - 28.0 mmol/L   Acid-Base Excess 10.6 (H) 0.0 - 2.0 mmol/L   O2 Saturation 81.5 %   Patient temperature 37.1    Allens test (pass/fail) PASS PASS    Comment: Performed at [facility], [address], [place], [place] [address]  Glucose, capillary     Status: None   Collection Time: 04/09/19  7:52 AM  Result Value Ref Range   Glucose-Capillary 86 70 - 99 mg/dL  Glucose, capillary     Status: None   Collection Time: 04/09/19 11:13 AM  Result Value Ref Range   Glucose-Capillary 95 70 - 99 mg/dL  Heparin level (unfractionated)     Status: Abnormal   Collection Time: 04/09/19  3:40 PM  Result Value Ref Range   Heparin Unfractionated 0.26 (L) 0.30 - 0.70 IU/mL    Comment: (NOTE) If heparin results are below expected values, and patient dosage has  been confirmed, suggest follow up testing of antithrombin III levels. Performed at [facility], [address], [place], [place] [address]   Glucose, capillary     Status: Abnormal   Collection Time: 04/09/19  4:09 PM  Result Value Ref Range   Glucose-Capillary 166 (H) 70 - 99 mg/dL  Glucose, capillary     Status: Abnormal   Collection Time: 04/09/19  7:46 PM  Result Value Ref Range   Glucose-Capillary 105 (H) 70 - 99 mg/dL  Heparin level (unfractionated)     Status: None   Collection Time: May 08, 2019 12:09 AM  Result Value Ref Range   Heparin Unfractionated 0.35 0.30 - 0.70 IU/mL    Comment: (NOTE) If heparin results are below expected values, and patient dosage has  been confirmed, suggest follow up testing of antithrombin III levels. Performed at [facility], [address] [place], [place] [address]   Heparin level (unfractionated)     Status: Abnormal   Collection Time: 05-08-19  4:20 AM  Result Value Ref Range   Heparin Unfractionated 0.17 (L) 0.30 - 0.70 IU/mL    Comment: (NOTE) If heparin results are below expected values, and patient  dosage has  been confirmed, suggest follow up testing of antithrombin III levels. Performed at  [facility], [address], [place], [place] [address]   CBC     Status: Abnormal   Collection Time: 05-02-19  4:20 AM  Result Value Ref Range   WBC 21.6 (H) 4.0 - 10.5 K/uL   RBC 3.86 (L) 3.87 - 5.11 MIL/uL   Hemoglobin 11.3 (L) 12.0 - 15.0 g/dL   HCT 37.5 36.0 - 46.0 %   MCV 97.2 80.0 - 100.0 fL   MCH 29.3 26.0 - 34.0 pg   MCHC 30.1 30.0 - 36.0 g/dL   RDW 15.6 (H) 11.5 - 15.5 %   Platelets 214 150 - 400 K/uL   nRBC 0.0 0.0 - 0.2 %    Comment: Performed at [facility], [address], [place], [place] [address]  Basic metabolic panel     Status: Abnormal   Collection Time: 05/02/2019  4:20 AM  Result Value Ref Range   Sodium 148 (H) 135 - 145 mmol/L   Potassium 4.0 3.5 - 5.1 mmol/L   Chloride 105 98 - 111 mmol/L   CO2 32 22 - 32 mmol/L   Glucose, Bld 180 (H) 70 - 99 mg/dL   BUN 120 (H) 8 - 23 mg/dL    Comment: RESULTS CONFIRMED BY MANUAL DILUTION   Creatinine, Ser 1.54 (H) 0.44 - 1.00 mg/dL   Calcium 9.5 8.9 - 10.3 mg/dL   GFR calc non Af Amer 30 (L) >60 mL/min   GFR calc Af Amer 35 (L) >60 mL/min   Anion gap 11 5 - 15    Comment: Performed at [facility], [address], [place], [place] [address]  Magnesium     Status: Abnormal   Collection Time: 05/02/19  4:20 AM  Result Value Ref Range   Magnesium 2.7 (H) 1.7 - 2.4 mg/dL    Comment: Performed at [facility], [address], [place], [place] [address]  Glucose, capillary     Status: Abnormal   Collection Time: 2019/05/02  7:42 AM  Result Value Ref Range   Glucose-Capillary 135 (H) 70 - 99 mg/dL    ABGS Recent Labs    04/09/19 0450  PHART 7.475*  PO2ART 47.2*  HCO3 33.0*   CULTURES No results found for this or any previous visit (from the past 240 hour(s)). Studies/Results: Dg Chest Port 1 View  Result Date: 04/09/2019 CLINICAL DATA:  Hypoxia.  Respiratory failure. EXAM: PORTABLE CHEST 1 VIEW COMPARISON:   04/08/2019. FINDINGS: Endotracheal tube, NG tube, left PICC line stable position. Heart size stable. Unchanged diffuse dense bilateral pulmonary infiltrates/edema. Bilateral pleural effusions again noted. No interim change. No pneumothorax. IMPRESSION: 1.  Lines and tubes in stable position. 2. Unchanged diffuse dense bilateral pulmonary infiltrates/edema again noted. Bilateral pleural effusions again noted. No interim change. Electronically Signed   By: Marcello Moores  Register   On: 04/09/2019 07:14    Medications:  Prior to Admission:  Medications Prior to Admission  Medication Sig Dispense Refill Last Dose  . apixaban (ELIQUIS) 2.5 MG TABS tablet Take 2.5 mg by mouth 2 (two) times daily.    03/24/2019 at Unknown time  . aspirin 81 MG tablet Take 81 mg by mouth daily.   unknown  . Calcium Carbonate-Vitamin D (CALCIUM + D PO) Take 1 tablet by mouth daily.    unknown  . digoxin (LANOXIN) 0.125 MG tablet Take 0.125 mg by mouth daily.   03/24/2019 at Unknown time  . furosemide (LASIX) 20 MG tablet Take 20 mg by mouth See admin instructions. Take one tablet by mouth  daily 5 days per week   03/23/2019 at Unknown time  . losartan (COZAAR) 50 MG tablet Take 50 mg by mouth daily.    03/24/2019 at Unknown time  . metoprolol tartrate (LOPRESSOR) 100 MG tablet Take 100 mg by mouth 2 (two) times daily.    03/24/2019 at Unknown time  . Multiple Vitamin (MULTIVITAMIN) tablet Take 1 tablet by mouth daily.   03/24/2019  . ondansetron (ZOFRAN) 4 MG tablet Take 4 mg by mouth daily as needed for nausea or vomiting.   03/24/2019 at Unknown time  . potassium chloride (K-DUR) 10 MEQ tablet Take 10 mEq by mouth See admin instructions. Take one tablet daily by mouth 5 days per week   03/23/2019 at Unknown time  . Cholecalciferol (VITAMIN D PO) Take 1 capsule by mouth daily.    03/24/2019   Scheduled: . chlorhexidine gluconate (MEDLINE KIT)  15 mL Mouth Rinse BID  . Chlorhexidine Gluconate Cloth  6 each Topical Daily  . feeding supplement  (OSMOLITE 1.5 CAL)  1,000 mL Per Tube Q24H  . feeding supplement (PRO-STAT SUGAR FREE 64)  30 mL Per Tube TID  . levalbuterol  0.63 mg Nebulization TID  . mouth rinse  15 mL Mouth Rinse 10 times per day  . pantoprazole (PROTONIX) IV  40 mg Intravenous Q24H  . predniSONE  40 mg Oral Q breakfast  . sodium chloride flush  10-40 mL Intracatheter Q12H  . sodium chloride flush  3 mL Intravenous Q12H   Continuous: . sodium chloride Stopped (04/05/19 0807)  . amiodarone 30 mg/hr (Apr 30, 2019 0634)  . fentaNYL infusion INTRAVENOUS 25 mcg/hr (04/09/19 0300)  . heparin 1,250 Units/hr (04-30-19 0847)   BRA:XENMMH chloride, acetaminophen **OR** acetaminophen, albuterol, bisacodyl, fentaNYL, metoprolol tartrate, midazolam, midazolam, ondansetron **OR** ondansetron (ZOFRAN) IV, phenol, polyethylene glycol, sodium chloride flush, sodium chloride flush, traZODone  Assesment: She was admitted with small bowel obstruction.  She was improving when she developed severe respiratory distress which culminated in her being intubated and placed on mechanical ventilation.  She has ARDS.  Chest x-ray is not clearing much.  She had acute on chronic diastolic heart failure but she is not diuresing very well now.  She has renal dysfunction.  She had aspiration pneumonia and may have aspirated again.  She has had atrial fib with RVR and did not tolerate Lopressor very well so she is back on amiodarone.  She has multisystem failure and she is approaching need for tracheostomy which her family does not want her to have.  They have agreed to DNR status which I think is appropriate  I had a discussion with her son and his wife yesterday in my office and plan is for one-way extubation later today. Principal Problem:   SBO (small bowel obstruction) (HCC) Active Problems:   Essential hypertension   Chronic a-fib   PNA (pneumonia)--Aspiration   Respiratory failure (HCC)   Acute diastolic CHF (congestive heart failure) (HCC)    Atrial fibrillation with RVR (HCC)   Elevated troponin   Lobar pneumonia (HCC)   Coronary artery disease due to lipid rich plaque   Hypotension   Ventilator dependence ([place])   Goals of care, counseling/discussion   Palliative care encounter   Advanced care planning/counseling discussion    Plan: As above    LOS: 16 days   Alonza Bogus 04-30-2019, 8:51 AM

## 2019-04-18 NOTE — Progress Notes (Signed)
Continued support for end of life.

## 2019-04-18 NOTE — Procedures (Signed)
Extubation Procedure Note  Patient Details:   Name: Oanh Devivo DOB: 10-04-1928 MRN: [id number]   Airway Documentation:  Airway 7 mm (Active)  Secured at (cm) 20 cm 04/16/19 1600  Measured From Teeth 04/16/2019 [place] 2019-04-16 1600  Secured By [employer] Apr 16, 2019 1600  Tube Holder Repositioned Yes 2019/04/16 1600  Cuff Pressure (cm H2O) 26 cm H2O 04/16/19 1111  Site Condition Dry 2019-04-16 0800     Airway (Active)  Secured at (cm) 20 cm 04/08/19 0800  Measured From Lips 04/08/19 0800  [place] 04/08/19 0800  Secured By [employer] 04/08/19 0800  Site Condition Dry 04/08/19 0800   Vent end date: April 16, 2019 Vent end time: 1703   Evaluation  O2 sats: currently acceptable Complications: No apparent complications Patient did tolerate procedure well. Bilateral Breath Sounds: Coarse crackles   No  Josiah Lobo [place] April 16, 2019, 5:14 PM

## 2019-04-18 DEATH — deceased

## 2019-05-19 NOTE — Progress Notes (Signed)
Patient's chart entered on 04-30-19 due to Son calling asking if patient's ring and pants were here. Patient is deceased and family was concerned about belongings. Chart entered to evaluate death record on 04/30/19 to see what belongings were listed.

## 2019-05-19 NOTE — Progress Notes (Signed)
05/05/19 RN accessed chart to search for notes regarding patients wedding rings as requested by patients family. No other information  found after note entered 2019/03/29 by Dorothyann Peng, RN documenting earrings being given to son.

## 2020-12-10 IMAGING — CR PORTABLE CHEST - 1 VIEW
1 series · 2 of 2 positions shown · non-contrast
Comparison: 04/08/2019.

CLINICAL DATA: Hypoxia.  Respiratory failure.

EXAM:
PORTABLE CHEST 1 VIEW

[Series 1: portable · 0.17mm/px · 2 of 2 slices shown]
[im 1/2]
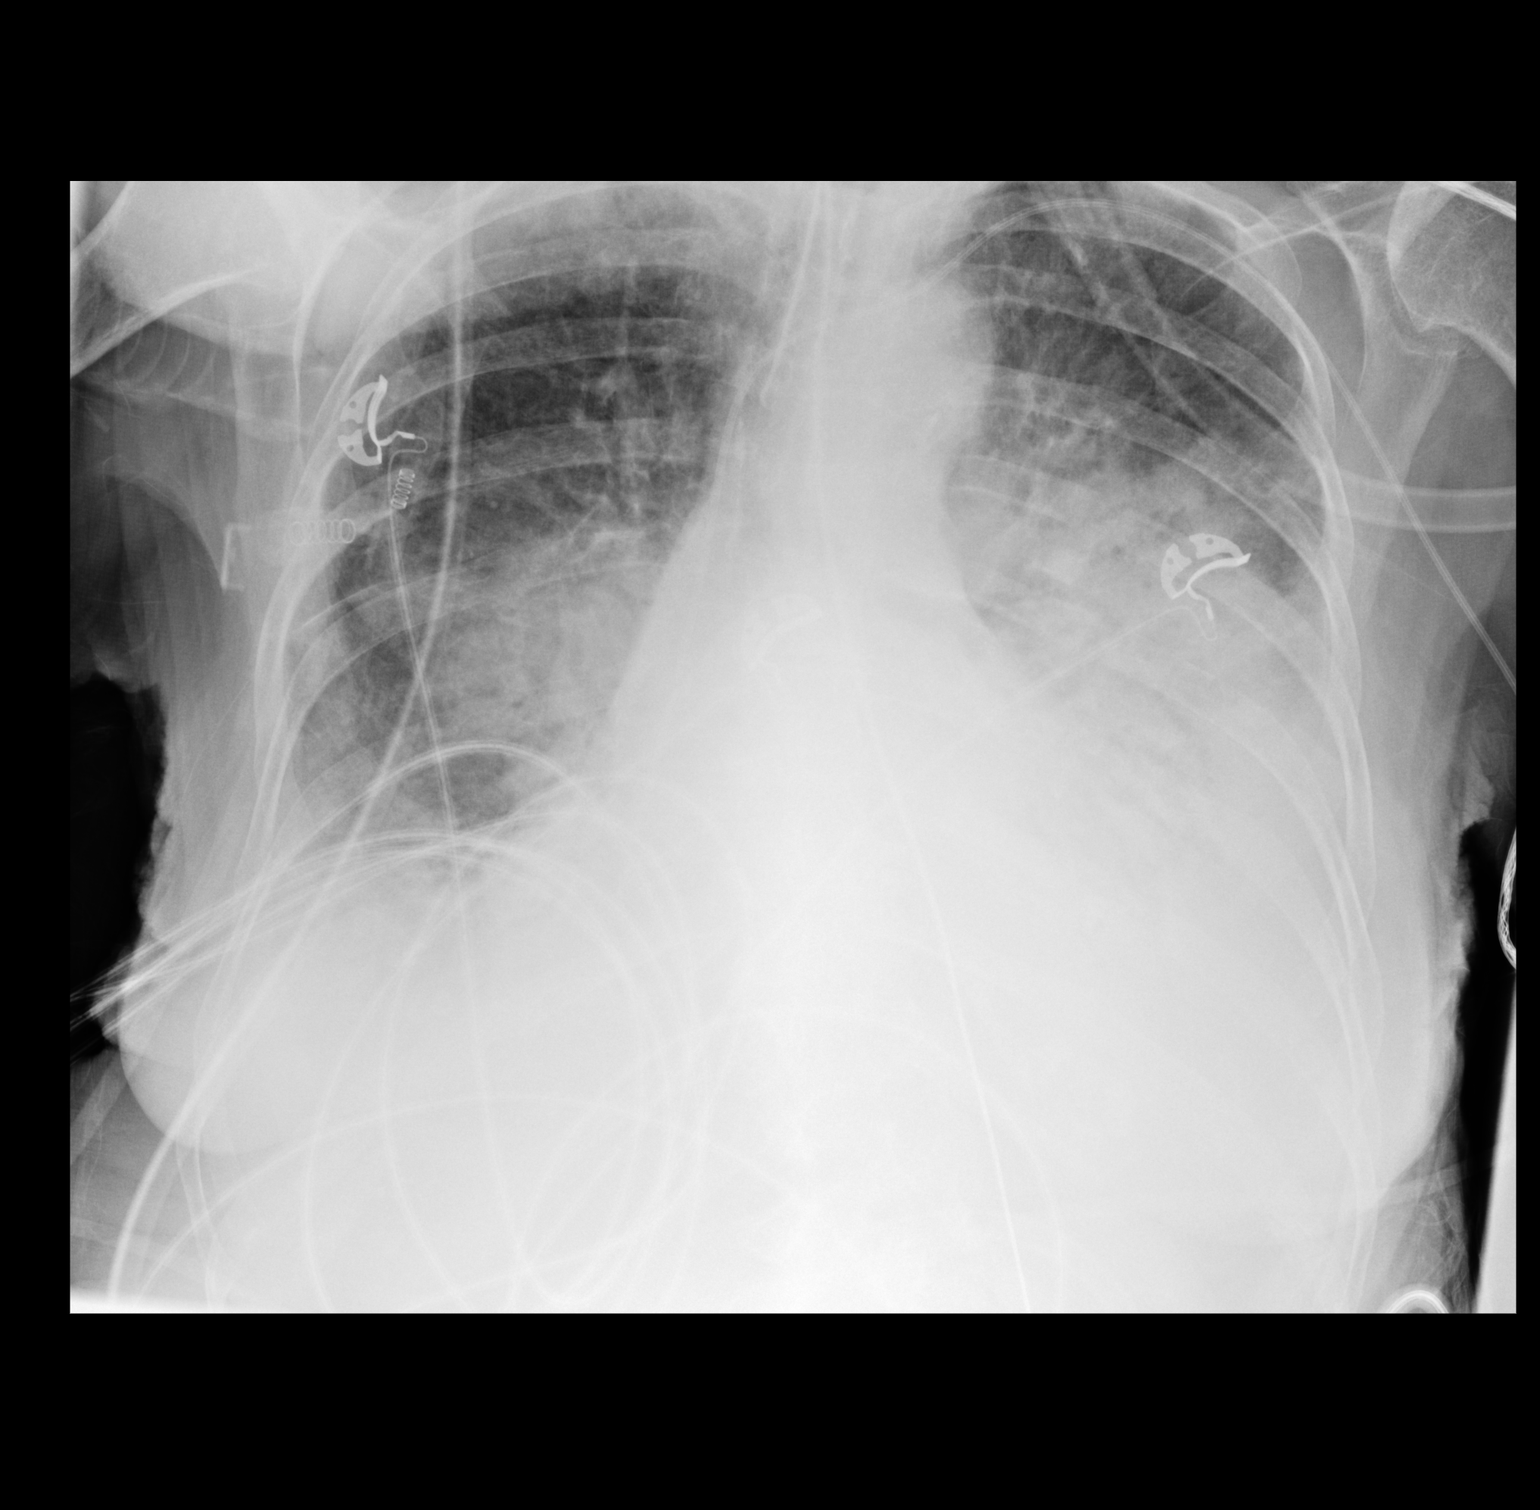
[im 2/2]
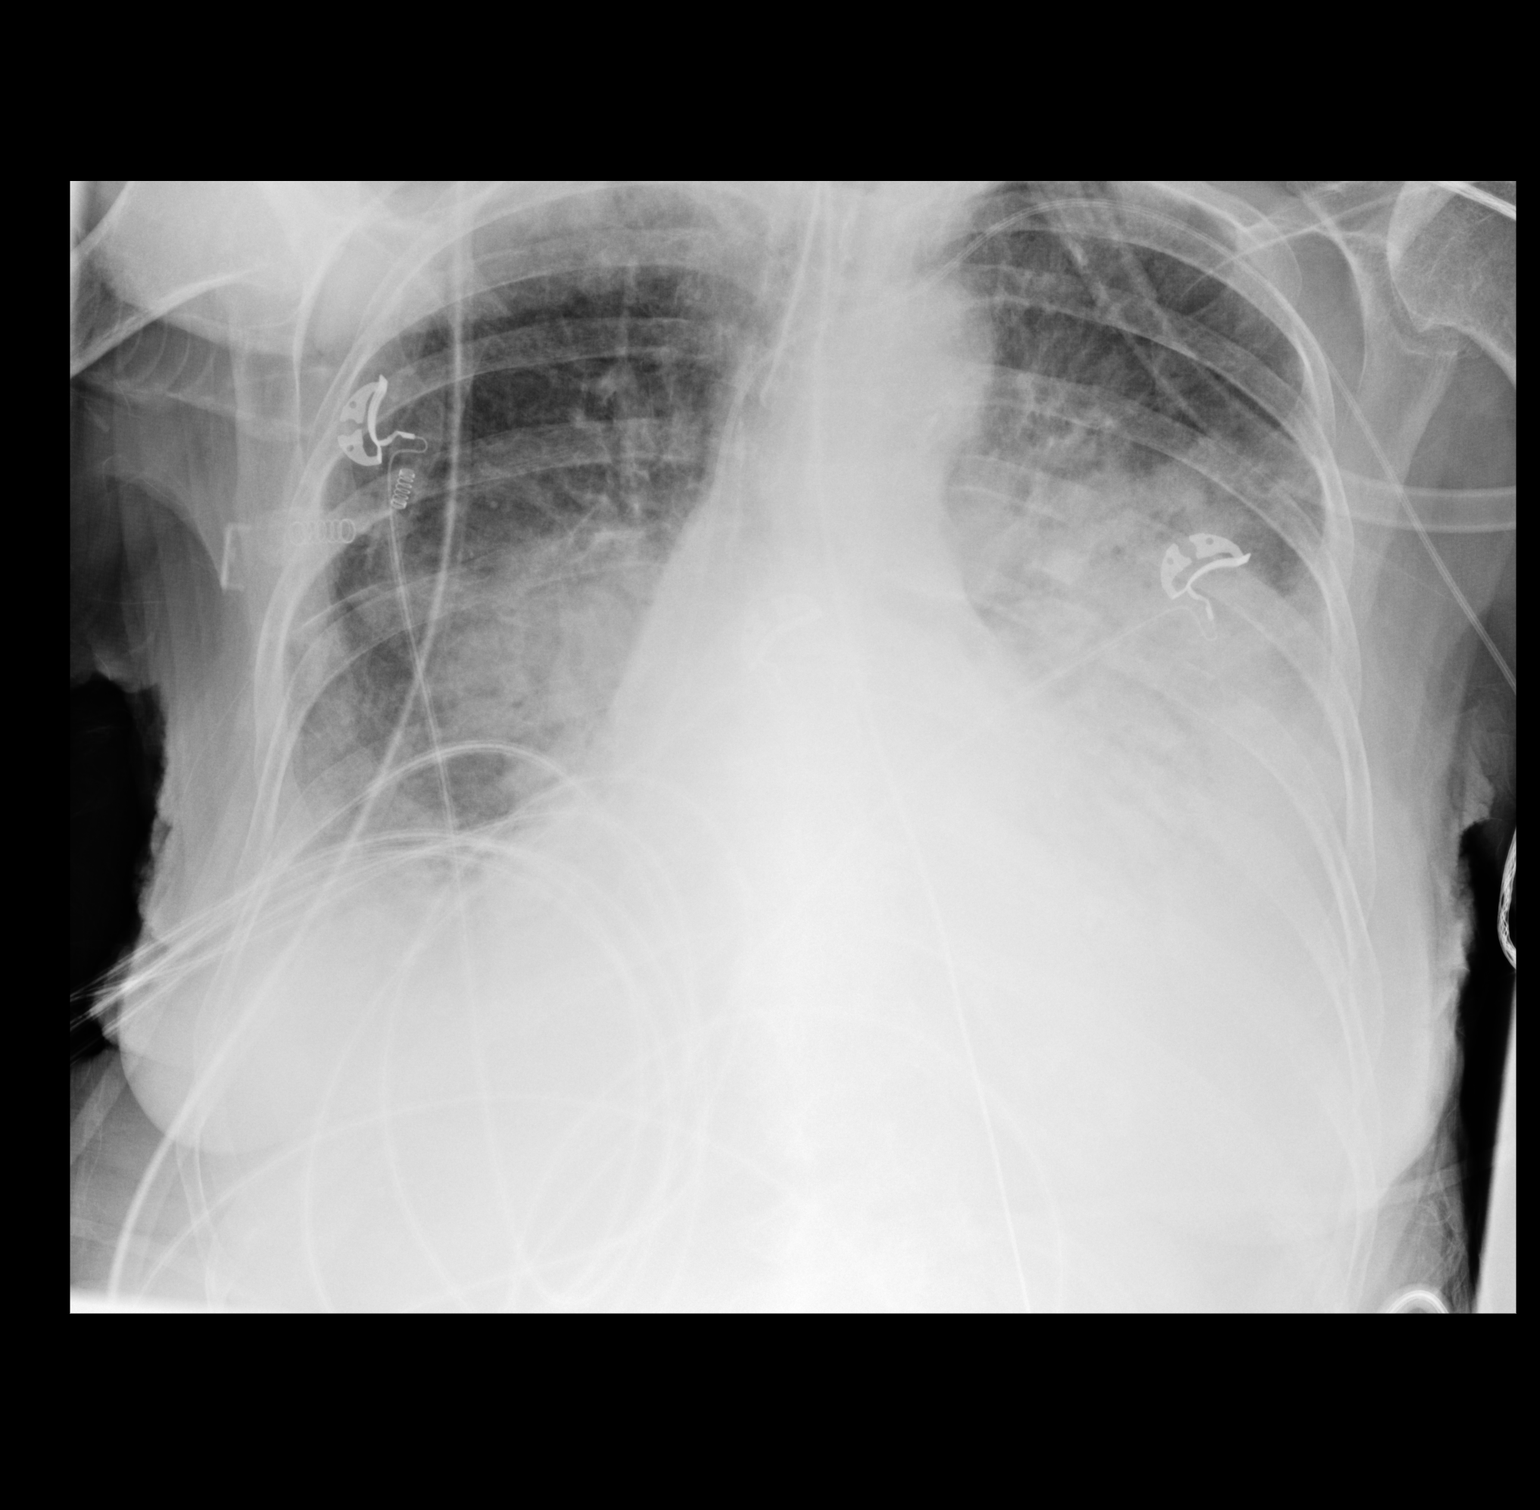

[2 of 2 positions shown; findings below may reference images not displayed]

FINDINGS: Endotracheal tube, NG tube, left PICC line stable position. Heart
size stable. Unchanged diffuse dense bilateral pulmonary
infiltrates/edema. Bilateral pleural effusions again noted. No
interim change. No pneumothorax.
IMPRESSION: 1.  Lines and tubes in stable position.

2. Unchanged diffuse dense bilateral pulmonary infiltrates/edema
again noted. Bilateral pleural effusions again noted. No interim
change.
